# Patient Record
Sex: Female | Born: 1970
Health system: Southern US, Community
[De-identification: ages and names within clinical notes are randomized; demographics above are authoritative.]

## PROBLEM LIST (undated history)

## (undated) DIAGNOSIS — D72829 Elevated white blood cell count, unspecified: Secondary | ICD-10-CM

## (undated) DIAGNOSIS — R7303 Prediabetes: Secondary | ICD-10-CM

## (undated) DIAGNOSIS — D219 Benign neoplasm of connective and other soft tissue, unspecified: Secondary | ICD-10-CM

## (undated) DIAGNOSIS — D649 Anemia, unspecified: Secondary | ICD-10-CM

## (undated) DIAGNOSIS — R7309 Other abnormal glucose: Secondary | ICD-10-CM

## (undated) DIAGNOSIS — N970 Female infertility associated with anovulation: Secondary | ICD-10-CM

## (undated) DIAGNOSIS — F419 Anxiety disorder, unspecified: Secondary | ICD-10-CM

## (undated) DIAGNOSIS — T7840XA Allergy, unspecified, initial encounter: Secondary | ICD-10-CM

## (undated) HISTORY — DX: Elevated white blood cell count, unspecified: D72.829

## (undated) HISTORY — DX: Allergy, unspecified, initial encounter: T78.40XA

## (undated) HISTORY — DX: Benign neoplasm of connective and other soft tissue, unspecified: D21.9

## (undated) HISTORY — DX: Female infertility associated with anovulation: N97.0

## (undated) HISTORY — PX: DILATION AND CURETTAGE OF UTERUS: SHX78

## (undated) HISTORY — PX: FRACTURE SURGERY: SHX138

## (undated) HISTORY — DX: Other abnormal glucose: R73.09

## (undated) HISTORY — PX: EYE SURGERY: SHX253

---

## 1999-08-07 ENCOUNTER — Other Ambulatory Visit: Admission: RE | Admit: 1999-08-07 | Discharge: 1999-08-07 | Payer: Self-pay | Admitting: Obstetrics and Gynecology

## 1999-08-08 ENCOUNTER — Other Ambulatory Visit: Admission: RE | Admit: 1999-08-08 | Discharge: 1999-08-08 | Payer: Self-pay | Admitting: Obstetrics and Gynecology

## 1999-08-08 ENCOUNTER — Encounter (INDEPENDENT_AMBULATORY_CARE_PROVIDER_SITE_OTHER): Payer: Self-pay | Admitting: Specialist

## 2002-04-28 ENCOUNTER — Other Ambulatory Visit: Admission: RE | Admit: 2002-04-28 | Discharge: 2002-04-28 | Payer: Self-pay | Admitting: Obstetrics and Gynecology

## 2002-09-04 ENCOUNTER — Ambulatory Visit (HOSPITAL_COMMUNITY): Admission: RE | Admit: 2002-09-04 | Discharge: 2002-09-04 | Payer: Self-pay | Admitting: Obstetrics and Gynecology

## 2002-09-04 ENCOUNTER — Encounter (INDEPENDENT_AMBULATORY_CARE_PROVIDER_SITE_OTHER): Payer: Self-pay

## 2005-06-23 ENCOUNTER — Emergency Department (HOSPITAL_COMMUNITY): Admission: EM | Admit: 2005-06-23 | Discharge: 2005-06-23 | Payer: Self-pay | Admitting: Family Medicine

## 2005-06-28 ENCOUNTER — Emergency Department (HOSPITAL_COMMUNITY): Admission: EM | Admit: 2005-06-28 | Discharge: 2005-06-28 | Payer: Self-pay | Admitting: Emergency Medicine

## 2006-11-11 ENCOUNTER — Ambulatory Visit: Payer: Self-pay | Admitting: Family Medicine

## 2006-12-18 ENCOUNTER — Ambulatory Visit: Payer: Self-pay | Admitting: Family Medicine

## 2007-04-21 ENCOUNTER — Ambulatory Visit: Payer: Self-pay | Admitting: Family Medicine

## 2007-04-30 ENCOUNTER — Ambulatory Visit: Payer: Self-pay | Admitting: Family Medicine

## 2007-04-30 ENCOUNTER — Other Ambulatory Visit: Admission: RE | Admit: 2007-04-30 | Discharge: 2007-04-30 | Payer: Self-pay | Admitting: Family Medicine

## 2007-10-13 ENCOUNTER — Ambulatory Visit: Payer: Self-pay | Admitting: Family Medicine

## 2007-11-13 ENCOUNTER — Emergency Department (HOSPITAL_COMMUNITY): Admission: EM | Admit: 2007-11-13 | Discharge: 2007-11-13 | Payer: Self-pay | Admitting: Family Medicine

## 2007-11-19 ENCOUNTER — Ambulatory Visit: Payer: Self-pay | Admitting: Family Medicine

## 2008-04-28 ENCOUNTER — Other Ambulatory Visit: Admission: RE | Admit: 2008-04-28 | Discharge: 2008-04-28 | Payer: Self-pay | Admitting: Family Medicine

## 2008-04-28 ENCOUNTER — Encounter: Payer: Self-pay | Admitting: Family Medicine

## 2008-04-28 ENCOUNTER — Ambulatory Visit: Payer: Self-pay | Admitting: Family Medicine

## 2008-11-09 ENCOUNTER — Ambulatory Visit: Payer: Self-pay | Admitting: Family Medicine

## 2009-02-10 ENCOUNTER — Ambulatory Visit: Payer: Self-pay | Admitting: Family Medicine

## 2011-05-17 NOTE — Op Note (Signed)
[name], Christina Mora                           ACCOUNT NO.:  [id number]   MEDICAL RECORD NO.:  [id number]                   PATIENT TYPE:  AMB   LOCATION:  SDC                                  FACILITY:  [facility]   PHYSICIAN:  Sheronette A. Cherly Hensen, M.D.         DATE OF BIRTH:  [birth year]   DATE OF PROCEDURE:  09/04/2002  DATE OF DISCHARGE:                                 OPERATIVE REPORT   PREOPERATIVE DIAGNOSIS:  Dysfunctional uterine bleeding.   POSTOPERATIVE DIAGNOSIS:  1. Dysfunctional uterine bleeding.  2. Possible submucosal fibroid.   PROCEDURE:  Dilation and curettage.   SURGEON:  Sheronette A. Cherly Hensen, M.D.   ANESTHESIA:  MAC, paracervical block.   INDICATIONS:  This is a 40 year old para 1, married black female, last  menstrual period of August 16, 2002, who has been bleeding since that time  until now and who now presents for surgical management.  The patient was  unable to tolerate her endometrial biopsy in the office.  She has been  complaining of passage of large clots and episodic dizziness and inability  to stop her bleeding.  She had a previous ultrasound a couple of months ago  that had shown some fibroids, however, sonohysterogram had only showed a  thickening of the endometrium without any defined submucosal fibroids.  The  patient in the interest of trying to get pregnant was started on Clomid,  however, she has noted that her cycles her remained long.  She is confirmed  anovulatory and had not responded to 100 mg of Clomid at the time that it  was given.  The patient now presents for surgical evaluation and management.  She is deferring attempting to conceive at this time and understood that due  to her bleeding hysteroscopy could not be performed at this time.  The  patient was transferred to the operating room.   DESCRIPTION OF PROCEDURE:  Under adequate monitored anesthesia, the patient  was placed in the dorsal lithotomy position.  She was given  antibiotics  given her prolonged bleeding.  She was sterilely prepped and draped in the  usual fashion.  The bladder was catheterized of a moderate amount of urine.  Bimanual examination revealed an irregular axial 7-8 weeks' size uterus.  No  adnexal masses could be appreciated.  Bivalve speculum was placed in the  vagina.  The cervix was noted to be gaping, about 1 cm dilated, and was taut  at the os.  Nesacaine 1% - 20 cc was injected paracervically at 3 and 9  o'clock.  The anterior lip of the cervix was grasped with a single-tooth  tenaculum.  The uterine cavity was curetted for a large amount of tissue,  and there was an irregularity in the anterior wall suggestive of a possible  submucosal fibroid.  The cavity was curetted until no further tissue of the  endometrium was obtained at which time the procedure was then terminated  by  removing all instruments from the vagina.  Specimen labeled endometrial  curetting was sent to pathology.  Estimated blood loss was minimal.  Complications were none.  The patient tolerated the procedure well and was  transferred to the recovery room in stable condition.                                               Sheronette A. Cherly Hensen, M.D.    SAC/MEDQ  D:  09/04/2002  T:  09/04/2002  Job:  [id number]

## 2011-07-19 ENCOUNTER — Ambulatory Visit
Admission: RE | Admit: 2011-07-19 | Discharge: 2011-07-19 | Disposition: A | Discharge status: Home / Self Care | Payer: BC Managed Care – PPO | Source: Ambulatory Visit | Attending: Physician Assistant | Admitting: Physician Assistant

## 2011-07-19 ENCOUNTER — Other Ambulatory Visit: Payer: Self-pay | Admitting: Physician Assistant

## 2011-07-19 DIAGNOSIS — R634 Abnormal weight loss: Secondary | ICD-10-CM

## 2011-07-19 DIAGNOSIS — D649 Anemia, unspecified: Secondary | ICD-10-CM

## 2011-07-19 MED ORDER — IOHEXOL 300 MG/ML  SOLN
125.0000 mL | Freq: Once | INTRAMUSCULAR | Status: AC | PRN
  Administered 2011-07-19: 125 mL via INTRAVENOUS

## 2011-10-08 LAB — I-STAT 8, (EC8 V) (CONVERTED LAB)
Acid-Base Excess: 2
Chloride: 103
HCT: 36
Operator id: 239701
Potassium: 3.7
Sodium: 137
TCO2: 27
pH, Ven: 7.442 — ABNORMAL HIGH

## 2011-10-08 LAB — DIFFERENTIAL
Basophils Absolute: 0
Basophils Relative: 0
Lymphocytes Relative: 28
Monocytes Relative: 6
Neutro Abs: 7.9 — ABNORMAL HIGH
Neutrophils Relative %: 65

## 2011-10-08 LAB — CBC
MCHC: 33.3
RBC: 4.01
RDW: 13.3

## 2011-10-08 LAB — INFLUENZA A AND B ANTIGEN (CONVERTED LAB)

## 2012-01-01 ENCOUNTER — Ambulatory Visit (INDEPENDENT_AMBULATORY_CARE_PROVIDER_SITE_OTHER): Payer: BC Managed Care – PPO

## 2012-01-01 DIAGNOSIS — R635 Abnormal weight gain: Secondary | ICD-10-CM

## 2012-01-01 DIAGNOSIS — K5900 Constipation, unspecified: Secondary | ICD-10-CM

## 2012-01-01 DIAGNOSIS — R21 Rash and other nonspecific skin eruption: Secondary | ICD-10-CM

## 2012-05-01 ENCOUNTER — Ambulatory Visit (INDEPENDENT_AMBULATORY_CARE_PROVIDER_SITE_OTHER): Payer: BC Managed Care – PPO | Admitting: Family Medicine

## 2012-05-01 VITALS — BP 108/72 | HR 96 | Temp 98.4°F | Resp 16 | Ht 67.0 in | Wt 219.8 lb

## 2012-05-01 DIAGNOSIS — J309 Allergic rhinitis, unspecified: Secondary | ICD-10-CM

## 2012-05-01 DIAGNOSIS — J329 Chronic sinusitis, unspecified: Secondary | ICD-10-CM

## 2012-05-01 MED ORDER — AZITHROMYCIN 250 MG PO TABS: ORAL_TABLET | ORAL | Status: AC

## 2012-05-01 NOTE — Progress Notes (Signed)
[facility]:  Office Visit  Chief Complaint:  Chief Complaint  Patient presents with  . Sinusitis    HPI: Christina Mora is a 41 y.o. female who complains of 3-4 week history of worsening allergies which she has tried Loratidine for. She has a h/o seasonal allergies. Worsening sxs of Sinus pressure, ear pain, maxially and frontal HA, draining yellow-green mucus from . Also has tried Zyrtec without relief. She likes the decongestant but if she takes it too long it dries her out.   Past Medical History  Diagnosis Date  . Allergy   . Anovulatory     10+ years  . Fibroids    Past Surgical History  Procedure Date  . Eye surgery   . Appendectomy    History   Social History  . Marital Status: Married    Spouse Name: N/A    Number of Children: N/A  . Years of Education: N/A   Social History Main Topics  . Smoking status: Never Smoker   . Smokeless tobacco: Not on file  . Alcohol Use: Not on file  . Drug Use: Not on file  . Sexually Active: Not on file   Other Topics Concern  . Not on file   Social History Narrative  . No narrative on file   No family history on file. No Known Allergies Prior to Admission medications   Not on File     ROS: The patient denies fevers, chills, night sweats, unintentional weight loss, chest pain, palpitations, wheezing, dyspnea on exertion, nausea, vomiting, abdominal pain, dysuria, hematuria, melena, numbness, weakness, or tingling. + sinus pressure, HA, teeth pain, yellow rhinorrhea  All other systems have been reviewed and were otherwise negative with the exception of those mentioned in the HPI and as above.    PHYSICAL EXAM: Filed Vitals:   05/01/12 0935  BP: 108/72  Pulse: 96  Temp: 98.4 F (36.9 C)  Resp: 16   Filed Vitals:   05/01/12 0935  Height: 5\' 7"  (1.702 m)  Weight: 219 lb 12.8 oz (99.701 kg)   Body mass index is 34.43 kg/(m^2).  General: Alert, no acute distress, obese HEENT:  Normocephalic,  atraumatic, oropharynx patent. Tm nl, +boggy, erythematous nares, mild sinus pressure behind eyes, no exudates Cardiovascular:  Regular rate and rhythm, no rubs murmurs or gallops.  No Carotid bruits, radial pulse intact. No pedal edema.  Respiratory: Clear to auscultation bilaterally.  No wheezes, rales, or rhonchi.  No cyanosis, no use of accessory musculature GI: No organomegaly, abdomen is soft and non-tender, positive bowel sounds.  No masses. Skin: No rashes. Neurologic: Facial musculature symmetric. Psychiatric: Patient is appropriate throughout our interaction. Lymphatic: No cervical lymphadenopathy Musculoskeletal: Gait intact.   LABS:    EKG/XRAY:   Primary read interpreted by Dr. Conley Rolls at [facility].   ASSESSMENT/PLAN: Encounter Diagnoses  Name Primary?  . Sinusitis Yes  . Allergic rhinitis    Patient questioned why I did not give her a shot of rocephin " like the other urgent care". I told her that I dont think that one shot of rocephin is going to cure her of her sxs and may introduce resistance. I would like to get her better and am not after her copay. She seemed skeptical of that. She insists that she always has to come back for a second round of antibiotics and that if she gets the " shot " she doesn;t have to and feels better. We had a lengthy discussion about  antibiotic management and I went through her records, she had Augmentin the last time. I told her this is a different abx.   1. Z-pack 2. Declined Flonase or atrovent due to HA 3. Sxs management: take Claritin D alternating days and regular claritin alternating days, or lower doses of Zyrtec 5 mg D  One day and then regular zyrtec the next day so she gets the beneits of the antihistamine and decongestant without the dryness; try humidifier, steam baths,etc.   Sherald Balbuena PHUONG, DO 05/01/2012 10:11 AM

## 2012-05-16 ENCOUNTER — Telehealth: Payer: Self-pay

## 2012-05-16 MED ORDER — IPRATROPIUM BROMIDE 0.06 % NA SOLN: 2.0000 | Freq: Three times a day (TID) | NASAL | Status: DC

## 2012-05-16 NOTE — Telephone Encounter (Signed)
PT STATES THAT SHE WAS RECENTLY DIAGNOSED WITH AN SINUS INFECTION AND WAS PRESCRIBED azithromycin (ZITHROMAX) 250 MG tablet.  PT STATES THAT SHE DID GET BETTER FOR A FEW DAYS AND NOW THE SAME SYMPTOMS ARE COMING BACK. PT WOULD LIKE T KNOW IF SOMETHING ELSE COULD BE CALLED INTO [facility] ON [address], PT ALSO MENTIONED THAT IF WE PRESCRIBED HER AN ANTIBIOTIC THEN WE MUST ALSO SEND OVER A RX FOR DIFLUCAN. PHARMACY:[facility] [address]

## 2012-05-16 NOTE — Telephone Encounter (Signed)
We usually do not call in antibiotics. We can call in symptomatic treatment for the congestion and sinus pressure. Sent in nasal spray.

## 2012-05-16 NOTE — Telephone Encounter (Signed)
(  CONTINUED FROM PREVIOUS MESSAGE)  START WITH A STRONGER ABX , MAYBE LIKE AUGMENTIN, BECAUSE SHE STATES AMOX AND ZPAK WON'T WORK FOR HER.

## 2012-05-16 NOTE — Telephone Encounter (Signed)
PT STATES SHE ALREADY HAS A NASAL SPRAY AND SAYS THAT DOESN'T HELP. PT IS AGGRAVATED THAT WE DON'T "LISTEN TO HER WHEN IT COMES TO HER SINUS INFECTIONS". SHE SAYS SHE TOLD DR LE THAT SHE NEEDED A CERTAIN ABX AND DR LE DIDN'T LISTEN TO HER. SHE FEELS SHE WANTS TO LEAVE OUR PRACTICE ALONE. SHE WANTED TO

## 2012-05-16 NOTE — Telephone Encounter (Signed)
Will forward to treating physician 

## 2012-05-16 NOTE — Telephone Encounter (Signed)
PT STATES SHE GOT BETTER FOR A WHILE., BUT IS NOW HAVING GREEN MUCOUS AND SINUS PAIN. PLEASE ADVISE. PT STATES SHE USUALLY GETS ONE SINUS INFECTION A YEAR AND IT REQUIRES A STRONG ABX.

## 2012-05-18 ENCOUNTER — Telehealth: Payer: Self-pay | Admitting: Family Medicine

## 2012-05-18 NOTE — Telephone Encounter (Signed)
Called home phone, unable to leave a message no voicemail.

## 2013-12-27 ENCOUNTER — Encounter (HOSPITAL_COMMUNITY): Payer: Self-pay | Admitting: Emergency Medicine

## 2013-12-27 ENCOUNTER — Emergency Department (HOSPITAL_COMMUNITY)
Admission: EM | Admit: 2013-12-27 | Discharge: 2013-12-27 | Disposition: A | Discharge status: Home / Self Care | Payer: BC Managed Care – PPO | Attending: Emergency Medicine | Admitting: Emergency Medicine

## 2013-12-27 DIAGNOSIS — R05 Cough: Secondary | ICD-10-CM | POA: Insufficient documentation

## 2013-12-27 DIAGNOSIS — B349 Viral infection, unspecified: Secondary | ICD-10-CM

## 2013-12-27 DIAGNOSIS — R000 Tachycardia, unspecified: Secondary | ICD-10-CM | POA: Insufficient documentation

## 2013-12-27 DIAGNOSIS — J029 Acute pharyngitis, unspecified: Secondary | ICD-10-CM | POA: Insufficient documentation

## 2013-12-27 DIAGNOSIS — Z79899 Other long term (current) drug therapy: Secondary | ICD-10-CM | POA: Insufficient documentation

## 2013-12-27 DIAGNOSIS — H9209 Otalgia, unspecified ear: Secondary | ICD-10-CM | POA: Insufficient documentation

## 2013-12-27 DIAGNOSIS — Z8742 Personal history of other diseases of the female genital tract: Secondary | ICD-10-CM | POA: Insufficient documentation

## 2013-12-27 DIAGNOSIS — B9789 Other viral agents as the cause of diseases classified elsewhere: Secondary | ICD-10-CM | POA: Insufficient documentation

## 2013-12-27 DIAGNOSIS — J3489 Other specified disorders of nose and nasal sinuses: Secondary | ICD-10-CM | POA: Insufficient documentation

## 2013-12-27 DIAGNOSIS — J069 Acute upper respiratory infection, unspecified: Secondary | ICD-10-CM | POA: Insufficient documentation

## 2013-12-27 DIAGNOSIS — R059 Cough, unspecified: Secondary | ICD-10-CM | POA: Insufficient documentation

## 2013-12-27 MED ORDER — METOCLOPRAMIDE HCL 5 MG/ML IJ SOLN
10.0000 mg | Freq: Once | INTRAMUSCULAR | Status: AC
  Administered 2013-12-27: 10 mg via INTRAVENOUS
  Filled 2013-12-27: qty 2

## 2013-12-27 MED ORDER — DIPHENHYDRAMINE HCL 50 MG/ML IJ SOLN
25.0000 mg | Freq: Once | INTRAMUSCULAR | Status: AC
  Administered 2013-12-27: 25 mg via INTRAVENOUS
  Filled 2013-12-27: qty 1

## 2013-12-27 MED ORDER — OSELTAMIVIR PHOSPHATE 75 MG PO CAPS: 75.0000 mg | ORAL_CAPSULE | Freq: Two times a day (BID) | ORAL | Status: DC

## 2013-12-27 MED ORDER — OSELTAMIVIR PHOSPHATE 75 MG PO CAPS
75.0000 mg | ORAL_CAPSULE | Freq: Once | ORAL | Status: AC
  Administered 2013-12-27: 75 mg via ORAL
  Filled 2013-12-27: qty 1

## 2013-12-27 MED ORDER — KETOROLAC TROMETHAMINE 30 MG/ML IJ SOLN
30.0000 mg | Freq: Once | INTRAMUSCULAR | Status: AC
  Administered 2013-12-27: 30 mg via INTRAVENOUS
  Filled 2013-12-27: qty 1

## 2013-12-27 MED ORDER — IBUPROFEN 800 MG PO TABS: 800.0000 mg | ORAL_TABLET | Freq: Three times a day (TID) | ORAL | Status: DC

## 2013-12-27 MED ORDER — FENTANYL CITRATE 0.05 MG/ML IJ SOLN
50.0000 ug | Freq: Once | INTRAMUSCULAR | Status: AC
  Administered 2013-12-27: 50 ug via INTRAVENOUS
  Filled 2013-12-27: qty 2

## 2013-12-27 MED ORDER — SODIUM CHLORIDE 0.9 % IV BOLUS (SEPSIS)
1000.0000 mL | Freq: Once | INTRAVENOUS | Status: AC
  Administered 2013-12-27: 1000 mL via INTRAVENOUS

## 2013-12-27 MED ORDER — ACETAMINOPHEN 325 MG PO TABS
650.0000 mg | ORAL_TABLET | Freq: Once | ORAL | Status: AC
  Administered 2013-12-27: 650 mg via ORAL
  Filled 2013-12-27: qty 2

## 2013-12-27 MED ORDER — ANTIPYRINE-BENZOCAINE 5.4-1.4 % OT SOLN
3.0000 [drp] | Freq: Once | OTIC | Status: AC
  Administered 2013-12-27: 3 [drp] via OTIC
  Filled 2013-12-27: qty 10

## 2013-12-27 NOTE — ED Provider Notes (Signed)
CSN: [id number]     Arrival date & time 12/27/13  0221 History   First MD Initiated Contact with Patient 12/27/13 [phone]     Chief Complaint  Patient presents with  . Headache  . URI   (Consider location/radiation/quality/duration/timing/severity/associated sxs/prior Treatment) HPI History provided by patient. Headache with associated fever, cough congestion and sore throat. She also complains of bilateral ear pain without drainage. Onset of symptoms yesterday. No neck pain or neck stiffness. Headache moderate to severe. No nausea vomiting or diarrhea. No difficulty breathing. Some dry cough. Did not get a flu shot this season.  Past Medical History  Diagnosis Date  . Allergy   . Anovulatory     10+ years  . Fibroids    Past Surgical History  Procedure Laterality Date  . Eye surgery    . Appendectomy     History reviewed. No pertinent family history. History  Substance Use Topics  . Smoking status: Never Smoker   . Smokeless tobacco: Not on file  . Alcohol Use: No   OB History   Grav Para Term Preterm Abortions TAB SAB Ect Mult Living                 Review of Systems  Constitutional: Positive for fever and chills.  HENT: Positive for congestion, ear pain and sore throat. Negative for trouble swallowing and voice change.   Eyes: Negative for visual disturbance.  Respiratory: Positive for cough. Negative for shortness of breath.   Cardiovascular: Negative for chest pain.  Gastrointestinal: Negative for vomiting and abdominal pain.  Genitourinary: Negative for dysuria.  Musculoskeletal: Negative for back pain, neck pain and neck stiffness.  Skin: Negative for rash.  Neurological: Positive for headaches. Negative for speech difficulty, weakness and numbness.  All other systems reviewed and are negative.    Allergies  Review of patient's allergies indicates no known allergies.  Home Medications   Current Outpatient Rx  Name  Route  Sig  Dispense  Refill  .  EXPIRED: ipratropium (ATROVENT) 0.06 % nasal spray   Nasal   Place 2 sprays into the nose 3 (three) times daily.   15 mL   0    BP 140/87  Pulse 130  Temp(Src) 102.2 F (39 C) (Oral)  Resp 20  Ht 5\' 7"  (1.702 m)  Wt 210 lb (95.255 kg)  BMI 32.88 kg/m2  SpO2 98%  LMP 12/27/2013 Physical Exam  Constitutional: She is oriented to person, place, and time. She appears well-developed and well-nourished.  HENT:  Head: Normocephalic and atraumatic.  Nasal congestion. TMs clear. No mastoid tenderness. No trismus. Uvula midline. Mildly enlarged tonsils without exudates.  Eyes: EOM are normal. Pupils are equal, round, and reactive to light.  Neck: Normal range of motion. Neck supple. No thyromegaly present.  Cardiovascular: Regular rhythm and intact distal pulses.   Tachycardic  Pulmonary/Chest: Effort normal and breath sounds normal. No stridor. No respiratory distress. She exhibits no tenderness.  Intermittent dry cough  Abdominal: Soft. She exhibits no distension. There is no tenderness. There is no rebound.  Musculoskeletal: Normal range of motion. She exhibits no edema.  Lymphadenopathy:    She has no cervical adenopathy.  Neurological: She is alert and oriented to person, place, and time. She displays normal reflexes. No cranial nerve deficit. Coordination normal.  Speech clear, no pronator drift, equal strengths with grips, biceps triceps. Normal gait.  Skin: Skin is warm and dry.    ED Course  Procedures (including critical care time) Labs  Review Labs Reviewed - No data to display Imaging Review No results found.   Date: 12/27/2013  Rate: 131  Rhythm: sinus tachycardia  QRS Axis: normal  Intervals: normal  ST/T Wave abnormalities: nonspecific ST changes  Conduction Disutrbances:none  Narrative Interpretation:   Old EKG Reviewed: none available  IV fluids. IV headache cocktail. Tylenol provided.  4:46 AM on recheck is feeling significantly better. Heart rate  normalizing.  Plan treatment for influenza.   Plan discharge home with close outpatient followup. Tamiflu prescribed. Motrin prescribed. Return precautions verbalized as understood. MDM  Diagnosis: Viral infection. Presenting with Headache, fever, flulike symptoms  EKG obtained in triage for tachycardia. Improved with IV fluids and medications as above Vital signs nursing notes reviewed and considered    Sunnie Nielsen, MD 12/27/13 [phone]

## 2013-12-27 NOTE — ED Notes (Signed)
Patient presents with c/o headache (unable to sit still in the chair rocking back and forth)  States she thought she had a head cold

## 2014-01-24 ENCOUNTER — Other Ambulatory Visit: Payer: Self-pay | Admitting: Ophthalmology

## 2014-04-22 ENCOUNTER — Ambulatory Visit (INDEPENDENT_AMBULATORY_CARE_PROVIDER_SITE_OTHER): Payer: BC Managed Care – PPO | Admitting: Family Medicine

## 2014-04-22 ENCOUNTER — Encounter: Payer: Self-pay | Admitting: Family Medicine

## 2014-04-22 VITALS — BP 109/71 | HR 74 | Temp 98.1°F | Resp 16 | Ht 66.0 in | Wt 221.0 lb

## 2014-04-22 DIAGNOSIS — Z1231 Encounter for screening mammogram for malignant neoplasm of breast: Secondary | ICD-10-CM

## 2014-04-22 DIAGNOSIS — Z131 Encounter for screening for diabetes mellitus: Secondary | ICD-10-CM

## 2014-04-22 DIAGNOSIS — T148XXA Other injury of unspecified body region, initial encounter: Secondary | ICD-10-CM

## 2014-04-22 LAB — POCT GLYCOSYLATED HEMOGLOBIN (HGB A1C): Hemoglobin A1C: 5.6

## 2014-04-22 MED ORDER — IBUPROFEN 800 MG PO TABS: 800.0000 mg | ORAL_TABLET | Freq: Three times a day (TID) | ORAL | Status: DC | PRN

## 2014-04-22 NOTE — Progress Notes (Signed)
Chief Complaint:  Chief Complaint  Patient presents with  . Arm Pain    left armpit, x 3 weeks  . Hair/Scalp Problem    HPI: Christina Mora is a 43 y.o. female who is here for :  1. She has had a 3 week history left arm pain around her armpit and chest area. It does not radiate.. She has had an aching pain, Varies mild to moderate. Ache is Always there. Feels uncomfortable since she feels like she needs to adjust her shirt. She has no CP, SOB, palpitations. Seh denies any repetitive motion or shoudler or neck pain. 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 However about 3 weeks ago she went dumpster diving at the [facility] daycare sicne they were getting new furniture and she ahs ahd pain sicne, she did not think abotu this before, she ahs not tried anythign for it. She has no family h/o breast cancer but has never had one, no breast changes that she is aware of. She deneis any n/w/tingling.  2. Creepy crawling feeling under scalp in 2 locations, consistent for the last 6 months, she does travel a lot, no one else has it, she has checked herself for lice and has not seen it. It goes away when she scartches it, it is  In the back and right side of her head. She uses a lot of prodycts in her hair , however no new changes.  3. She wants to be screened for diabetes   Past Medical History  Diagnosis Date  . Allergy   . Anovulatory     10+ years  . Fibroids    Past Surgical History  Procedure Laterality Date  . Eye surgery    . Appendectomy     History   Social History  . Marital Status: Married    Spouse Name: N/A    Number of Children: N/A  . Years of Education: N/A   Social History Main Topics  . Smoking status: Never Smoker   . Smokeless tobacco: None  . Alcohol Use: No  . Drug Use: No  . Sexual Activity: None   Other Topics Concern  . None   Social History Narrative  . None   Family History  Problem Relation Age of Onset  .  Stroke Mother   . Heart disease Mother   . COPD Mother   . Epilepsy Mother   . Obesity Mother    Allergies  Allergen Reactions  . Robaxin [Methocarbamol]    Prior to Admission medications   Medication Sig Start Date End Date Taking? Authorizing Provider  ibuprofen (ADVIL,MOTRIN) 800 MG tablet Take 1 tablet (800 mg total) by mouth 3 (three) times daily. 12/27/13  Yes Teressa Lower, MD     ROS: The patient denies fevers, chills, night sweats, unintentional weight loss, chest pain, palpitations, wheezing, dyspnea on exertion, nausea, vomiting, abdominal pain, dysuria, hematuria, melena, numbness, weakness, or tingling.   All other systems have been reviewed and were otherwise negative with the exception of those mentioned in the HPI and as above.    PHYSICAL EXAM: Filed Vitals:   04/22/14 1050  BP: 109/71  Pulse: 74  Temp: 98.1 F (36.7 C)  Resp: 16   Filed Vitals:   04/22/14 1050  Height: 5' 6"  (1.676 m)  Weight: 221 lb (100.245 kg)   Body mass index is 35.69 kg/(m^2).  General: Alert, no acute distress, obese AA female HEENT:  Normocephalic, atraumatic, oropharynx patent. EOMI, PERRLA Cardiovascular:  Regular rate and rhythm, no rubs murmurs or gallops.  No Carotid bruits, radial pulse intact. No pedal edema.  Respiratory: Clear to auscultation bilaterally.  No wheezes, rales, or rhonchi.  No cyanosis, no use of accessory musculature GI: No organomegaly, abdomen is soft and non-tender, positive bowel sounds.  No masses. Skin: No rashes. Neurologic: Facial musculature symmetric. Psychiatric: Patient is appropriate throughout our interaction. Lymphatic: No cervical lymphadenopathy Musculoskeletal: Gait intact. 5/5 strength, 2/2 DTS, sensation intact Neck and shoudler exam nl Hair and scalp exam nl, no eczema or alopecia   LABS: Results for orders placed in visit on 04/22/14  POCT GLYCOSYLATED HEMOGLOBIN (HGB A1C)      Result Value Ref Range   Hemoglobin A1C 5.6        EKG/XRAY:   Primary read interpreted by Dr. Marin Comment at [facility].   ASSESSMENT/PLAN: Encounter Diagnoses  Name Primary?  . Screening for diabetes mellitus Yes  . Visit for screening mammogram   . Sprain and strain    Rx ibupofren 800 mg TID prn for pectoralis msk sprain/strain vs irritation of coopers ligament of breast Refer for mammogram since not UTD Monitor scalp isses, advise to try nonhypo shampoo or cut her hair to see if improved but she says no to the haircutting, she has a lot of hair and it is very thick and curly  F/u prn  Gross sideeffects, risk and benefits, and alternatives of medications d/w patient. Patient is aware that all medications have potential sideeffects and we are unable to predict every sideeffect or drug-drug interaction that may occur.  Glenford Bayley, DO 04/22/2014 3:27 PM

## 2014-04-26 ENCOUNTER — Ambulatory Visit (INDEPENDENT_AMBULATORY_CARE_PROVIDER_SITE_OTHER): Payer: BC Managed Care – PPO | Admitting: Family Medicine

## 2014-04-26 VITALS — BP 124/80 | HR 64 | Temp 98.3°F | Resp 18 | Ht 67.0 in | Wt 221.0 lb

## 2014-04-26 DIAGNOSIS — N9489 Other specified conditions associated with female genital organs and menstrual cycle: Secondary | ICD-10-CM

## 2014-04-26 DIAGNOSIS — R202 Paresthesia of skin: Secondary | ICD-10-CM

## 2014-04-26 DIAGNOSIS — R209 Unspecified disturbances of skin sensation: Secondary | ICD-10-CM

## 2014-04-26 DIAGNOSIS — N949 Unspecified condition associated with female genital organs and menstrual cycle: Secondary | ICD-10-CM

## 2014-04-26 DIAGNOSIS — N9089 Other specified noninflammatory disorders of vulva and perineum: Secondary | ICD-10-CM

## 2014-04-26 DIAGNOSIS — N907 Vulvar cyst: Secondary | ICD-10-CM

## 2014-04-26 LAB — POCT WET PREP WITH KOH
CLUE CELLS WET PREP PER HPF POC: NEGATIVE
KOH Prep POC: NEGATIVE
RBC WET PREP PER HPF POC: NEGATIVE
TRICHOMONAS UA: NEGATIVE
Yeast Wet Prep HPF POC: NEGATIVE

## 2014-04-26 NOTE — Patient Instructions (Addendum)
Use Zostrix or Zostrix H.P. on scalp as directed  Use a little polysporin on the labial lesion  Return if problems

## 2014-04-26 NOTE — Progress Notes (Signed)
Subjective: Patient is here with 2 main complaints. She's been having a paresthesia sensation on the left anterior scalp. She has a sensation that something is been under the skin. It is persisted in the same area. Nothing is visible. Return if look. Dr. Marin Comment looked. She also has had a little sore place on her labia since yesterday primarily. In addition to that she's been for a month or more having a burning sensation like he might have a yeast infection but never has. She is quite anxious about that.  Objective: No lesion visible scalp. External genitalia has a little erythema run its per portion of the labia both sides just below the clitoris. However that is not what she complains of. The place is she is concerned about is a little bump just lateral to the clitoris. This looks like a tiny right. The cyst. I punctured it with a 21-gauge needle and didn't get much contents but did culture it. Vaginal mucosa unremarkable. Cervix benign. Wet prep taken.  Assessment: Scalp paresthesia Labial cyst Vaginal discomfort and burning  Plan: Results for orders placed in visit on 04/26/14  POCT WET PREP WITH KOH      Result Value Ref Range   Trichomonas, UA Negative     Clue Cells Wet Prep HPF POC neg     Epithelial Wet Prep HPF POC 0-6     Yeast Wet Prep HPF POC neg     Bacteria Wet Prep HPF POC trace     RBC Wet Prep HPF POC neg     WBC Wet Prep HPF POC 6-8     KOH Prep POC Negative     Gave reassurance that everything looks good she does not want to take oral antibiotics for that placed on the labia, so have her use a little Polysporin on it. It is not doing better she is to let us know. Use the Zostrix as discussed. As an adult, and she wondered about a place it is been sore in her left axilla for a while the Dr. Marin Comment just recently checked. She asked if these all could be connected and I informed her that I did not think so.

## 2014-04-27 LAB — HSV(HERPES SIMPLEX VRS) I + II AB-IGG: HSV 2 Glycoprotein G Ab, IgG: <0.1 IV

## 2014-04-27 LAB — HIV ANTIBODY (ROUTINE TESTING W REFLEX): HIV: NONREACTIVE

## 2014-04-29 LAB — WOUND CULTURE
GRAM STAIN: NONE SEEN
Gram Stain: NONE SEEN
Gram Stain: NONE SEEN
Organism ID, Bacteria: NORMAL

## 2014-05-03 ENCOUNTER — Encounter: Payer: Self-pay | Admitting: Family Medicine

## 2014-05-04 ENCOUNTER — Ambulatory Visit: Payer: Self-pay | Admitting: Family Medicine

## 2014-05-12 ENCOUNTER — Ambulatory Visit (INDEPENDENT_AMBULATORY_CARE_PROVIDER_SITE_OTHER): Payer: BC Managed Care – PPO | Admitting: Family Medicine

## 2014-05-12 ENCOUNTER — Encounter: Payer: Self-pay | Admitting: Family Medicine

## 2014-05-12 VITALS — BP 108/66 | HR 73 | Temp 98.3°F | Resp 16 | Ht 67.0 in | Wt 218.3 lb

## 2014-05-12 DIAGNOSIS — Z0000 Encounter for general adult medical examination without abnormal findings: Secondary | ICD-10-CM

## 2014-05-12 DIAGNOSIS — E669 Obesity, unspecified: Secondary | ICD-10-CM

## 2014-05-12 DIAGNOSIS — Z01419 Encounter for gynecological examination (general) (routine) without abnormal findings: Secondary | ICD-10-CM

## 2014-05-12 DIAGNOSIS — Z124 Encounter for screening for malignant neoplasm of cervix: Secondary | ICD-10-CM

## 2014-05-12 DIAGNOSIS — R198 Other specified symptoms and signs involving the digestive system and abdomen: Secondary | ICD-10-CM

## 2014-05-12 DIAGNOSIS — Z1231 Encounter for screening mammogram for malignant neoplasm of breast: Secondary | ICD-10-CM

## 2014-05-12 DIAGNOSIS — R3989 Other symptoms and signs involving the genitourinary system: Secondary | ICD-10-CM

## 2014-05-12 DIAGNOSIS — J309 Allergic rhinitis, unspecified: Secondary | ICD-10-CM

## 2014-05-12 LAB — CBC WITH DIFFERENTIAL/PLATELET
BASOS ABS: 0 10*3/uL (ref 0.0–0.1)
Basophils Relative: 0 % (ref 0–1)
Eosinophils Absolute: 0.2 10*3/uL (ref 0.0–0.7)
Eosinophils Relative: 2 % (ref 0–5)
HEMATOCRIT: 36.4 % (ref 36.0–46.0)
Hemoglobin: 12.2 g/dL (ref 12.0–15.0)
LYMPHS PCT: 22 % (ref 12–46)
Lymphs Abs: 2.3 10*3/uL (ref 0.7–4.0)
MCH: 27.7 pg (ref 26.0–34.0)
MCHC: 33.5 g/dL (ref 30.0–36.0)
MCV: 82.5 fL (ref 78.0–100.0)
MONO ABS: 0.5 10*3/uL (ref 0.1–1.0)
Monocytes Relative: 5 % (ref 3–12)
NEUTROS ABS: 7.3 10*3/uL (ref 1.7–7.7)
NEUTROS PCT: 71 % (ref 43–77)
PLATELETS: 425 10*3/uL — AB (ref 150–400)
RBC: 4.41 MIL/uL (ref 3.87–5.11)
RDW: 13.9 % (ref 11.5–15.5)
WBC: 10.3 10*3/uL (ref 4.0–10.5)

## 2014-05-12 MED ORDER — CLOBETASOL PROP EMOLLIENT BASE 0.05 % EX CREA: 1.0000 "application " | TOPICAL_CREAM | Freq: Two times a day (BID) | CUTANEOUS | Status: DC

## 2014-05-12 MED ORDER — DESLORATADINE 5 MG PO TABS: 5.0000 mg | ORAL_TABLET | Freq: Every day | ORAL | Status: DC

## 2014-05-12 NOTE — Progress Notes (Signed)
Subjective:    Patient ID: Christina Mora, female    DOB: 02/27/1971, 43 y.o.   MRN: [id number]  HPI  This 43 y.o. AA female is here for CPE/ PAP. She has seasonal allergies. Pt c/o cyst on perineum; this problem was previously evaluated (STD testing was negative) and medicated topical did not completely resolve lesion.  Patient Active Problem List   Diagnosis Date Noted  . Obesity, unspecified 05/17/2014  . Allergic rhinitis, cause unspecified 05/17/2014   PMHx, Surg Hx, Soc and Fam Hx reviewed.   Review of Systems  Constitutional: Negative.   HENT: Negative.   Eyes: Negative.   Respiratory: Negative.   Cardiovascular: Negative.   Gastrointestinal: Negative.   Endocrine: Negative.   Genitourinary: Positive for genital sores.  Musculoskeletal: Negative.   Skin: Negative.   Allergic/Immunologic: Positive for environmental allergies.  Neurological: Positive for numbness.       Tingling scalp area  Hematological: Negative.   Psychiatric/Behavioral: Positive for sleep disturbance.      Objective:   Physical Exam  Nursing note and vitals reviewed. Constitutional: She is oriented to person, place, and time. Vital signs are normal. She appears well-developed and well-nourished. No distress.  HENT:  Head: Normocephalic and atraumatic.  Right Ear: Hearing, tympanic membrane, external ear and ear canal normal.  Left Ear: Hearing, tympanic membrane, external ear and ear canal normal.  Nose: Mucosal edema present. No nasal deformity or septal deviation.  Mouth/Throat: Uvula is midline and mucous membranes are normal. No oral lesions. Normal dentition. No dental caries. Posterior oropharyngeal erythema present. No oropharyngeal exudate.  Eyes: Conjunctivae, EOM and lids are normal. Pupils are equal, round, and reactive to light. No scleral icterus.  Fundoscopic exam:      The right eye shows no arteriolar narrowing, no AV nicking and no papilledema. The right eye shows red reflex.      The left eye shows no arteriolar narrowing, no AV nicking and no papilledema. The left eye shows red reflex.  Neck: Trachea normal, normal range of motion and full passive range of motion without pain. Neck supple. No spinous process tenderness and no muscular tenderness present. No mass and no thyromegaly present.  Cardiovascular: Normal rate, regular rhythm, S1 normal, S2 normal, normal heart sounds and normal pulses.   No extrasystoles are present. PMI is not displaced.  Exam reveals no gallop and no friction rub.   No murmur heard. Pulmonary/Chest: Effort normal. No respiratory distress. Right breast exhibits no inverted nipple, no mass, no nipple discharge, no skin change and no tenderness. Left breast exhibits no inverted nipple, no mass, no nipple discharge, no skin change and no tenderness. Breasts are symmetrical.  Abdominal: Soft. Normal appearance and bowel sounds are normal. She exhibits no distension and no mass. There is no hepatosplenomegaly. There is no tenderness. There is no guarding and no CVA tenderness. No hernia.  Genitourinary: Rectum normal, vagina normal and uterus normal. There is lesion on the right labia. There is no rash, tenderness or injury on the right labia. There is no rash, tenderness, lesion or injury on the left labia. Cervix exhibits no motion tenderness, no discharge and no friability. Right adnexum displays no mass, no tenderness and no fullness. Left adnexum displays no mass, no tenderness and no fullness.  Tiny cystic lesion anterior labia majora; no ulceration or discharge.  Musculoskeletal: Normal range of motion. She exhibits no edema and no tenderness.       Cervical back: Normal.  Thoracic back: Normal.       Lumbar back: Normal.  Remainder of exam unremarkable.  Lymphadenopathy:       Head (right side): No submental, no submandibular, no tonsillar, no preauricular, no posterior auricular and no occipital adenopathy present.       Head (left  side): No submental, no submandibular, no tonsillar, no preauricular, no posterior auricular and no occipital adenopathy present.    She has no cervical adenopathy.    She has no axillary adenopathy.       Right: No inguinal and no supraclavicular adenopathy present.       Left: No inguinal and no supraclavicular adenopathy present.  Neurological: She is oriented to person, place, and time. She has normal strength and normal reflexes. She displays no atrophy. No cranial nerve deficit or sensory deficit. She exhibits normal muscle tone. Coordination and gait normal.  Skin: Skin is warm, dry and intact. No ecchymosis and no rash noted. She is not diaphoretic. No cyanosis or erythema. Nails show no clubbing.  Psychiatric: She has a normal mood and affect. Her speech is normal and behavior is normal. Judgment and thought content normal. Cognition and memory are normal.      Assessment & Plan:  Routine general medical examination at a health care facility - Plan: CBC with Differential, Lipid panel, COMPLETE METABOLIC PANEL WITH GFR  Encounter for cervical Pap smear with pelvic exam - Plan: Pap IG and Chlamydia/Gonococcus, NAA  Genital sore- Trial Clobetasol topical.  Allergic rhinitis, cause unspecified  Other screening mammogram - Plan: MM Digital Screening  Obesity, unspecified- Encouraged better nutrition and regular fitness activities. Goal weight loss: 1/2-1 pound per week.  Medications prescribed: . Clobetasol Prop Emollient Base 0.05 % emollient cream    Sig: Apply 1 application topically 2 (two) times daily. Use very sparingly.    Dispense:  30 g    Refill:  0  . desloratadine (CLARINEX) 5 MG tablet    Sig: Take 1 tablet (5 mg total) by mouth daily.    Dispense:  30 tablet    Refill:  5

## 2014-05-12 NOTE — Patient Instructions (Addendum)
Keeping You Healthy  Get These Tests 1. Blood Pressure- Have your blood pressure checked once a year by your health care provider.  Normal blood pressure is 120/80. 2. Weight- Have your body mass index (BMI) calculated to screen for obesity.  BMI is measure of body fat based on height and weight.  You can also calculate your own BMI at GravelBags.it. 3. Cholesterol- Have your cholesterol checked every 5 years starting at age 42 then yearly starting at age 17. 47. Chlamydia, HIV, and other sexually transmitted diseases- Get screened every year until age 45, then within three months of each new sexual provider. 5. Pap Smear- Every 1-3 years; discuss with your health care provider. 6. Mammogram- Every year starting at age 68  Take these medicines  Calcium with Vitamin D-Your body needs 1200 mg of Calcium each day and [phone] IU of Vitamin D daily.  Your body can only absorb 500 mg of Calcium at a time so Calcium must be taken in 2 or 3 divided doses throughout the day.  Multivitamin with folic acid- Once daily if it is possible for you to become pregnant.  Get these Immunizations  Gardasil-Series of three doses; prevents HPV related illness such as genital warts and cervical cancer.  Menactra-Single dose; prevents meningitis.  Tetanus shot- Every 10 years.  Flu shot-Every year.  Take these steps 1. Do not smoke-Your healthcare provider can help you quit.  For tips on how to quit go to www.smokefree.gov or call 1-800 QUITNOW. 2. Be physically active- Exercise 5 days a week for at least 30 minutes.  If you are not already physically active, start slow and gradually work up to 30 minutes of moderate physical activity.  Examples of moderate activity include walking briskly, dancing, swimming, bicycling, etc. 3. Breast Cancer- A self breast exam every month is important for early detection of breast cancer.  For more information and instruction on self breast exams, ask your  healthcare provider or https://www.patel.info/. 4. Eat a healthy diet- Eat a variety of healthy foods such as fruits, vegetables, whole grains, low fat milk, low fat cheeses, yogurt, lean meats, poultry and fish, beans, nuts, tofu, etc.  For more information go to www. Thenutritionsource.org 5. Drink alcohol in moderation- Limit alcohol intake to one drink or less per day. Never drink and drive. 6. Depression- Your emotional health is as important as your physical health.  If you're feeling down or losing interest in things you normally enjoy please talk to your healthcare provider about being screened for depression. 7. Dental visit- Brush and floss your teeth twice daily; visit your dentist twice a year. 8. Eye doctor- Get an eye exam at least every 2 years. 9. Helmet use- Always wear a helmet when riding a bicycle, motorcycle, rollerblading or skateboarding. 42. Safe sex- If you may be exposed to sexually transmitted infections, use a condom. 11. Seat belts- Seat belts can save your live; always wear one. 12. Smoke/Carbon Monoxide detectors- These detectors need to be installed on the appropriate level of your home. Replace batteries at least once a year. 13. Skin cancer- When out in the sun please cover up and use sunscreen 15 SPF or higher. 14. Violence- If anyone is threatening or hurting you, please tell your healthcare provider.    Allergic Rhinitis Allergic rhinitis is when the mucous membranes in the nose respond to allergens. Allergens are particles in the air that cause your body to have an allergic reaction. This causes you to release allergic antibodies.  Through a chain of events, these eventually cause you to release histamine into the blood stream. Although meant to protect the body, it is this release of histamine that causes your discomfort, such as frequent sneezing, congestion, and an itchy, runny nose.  CAUSES  Seasonal allergic rhinitis (hay fever) is  caused by pollen allergens that may come from grasses, trees, and weeds. Year-round allergic rhinitis (perennial allergic rhinitis) is caused by allergens such as house dust mites, pet dander, and mold spores.  SYMPTOMS   Nasal stuffiness (congestion).  Itchy, runny nose with sneezing and tearing of the eyes. DIAGNOSIS  Your health care provider can help you determine the allergen or allergens that trigger your symptoms. If you and your health care provider are unable to determine the allergen, skin or blood testing may be used. TREATMENT  Allergic Rhinitis does not have a cure, but it can be controlled by:  Medicines and allergy shots (immunotherapy).  Avoiding the allergen. Hay fever may often be treated with antihistamines in pill or nasal spray forms. Antihistamines block the effects of histamine. There are over-the-counter medicines that may help with nasal congestion and swelling around the eyes. Check with your health care provider before taking or giving this medicine.  If avoiding the allergen or the medicine prescribed do not work, there are many new medicines your health care provider can prescribe. Stronger medicine may be used if initial measures are ineffective. Desensitizing injections can be used if medicine and avoidance does not work. Desensitization is when a patient is given ongoing shots until the body becomes less sensitive to the allergen. Make sure you follow up with your health care provider if problems continue. HOME CARE INSTRUCTIONS It is not possible to completely avoid allergens, but you can reduce your symptoms by taking steps to limit your exposure to them. It helps to know exactly what you are allergic to so that you can avoid your specific triggers. SEEK MEDICAL CARE IF:   You have a fever.  You develop a cough that does not stop easily (persistent).  You have shortness of breath.  You start wheezing.  Symptoms interfere with normal daily  activities. Document Released: 09/10/2001 Document Revised: 10/06/2013 Document Reviewed: 08/23/2013 [facility] Patient Information 2014 [place].

## 2014-05-13 LAB — COMPLETE METABOLIC PANEL WITH GFR
ALT: 12 U/L (ref 0–35)
AST: 16 U/L (ref 0–37)
Albumin: 4.4 g/dL (ref 3.5–5.2)
Alkaline Phosphatase: 91 U/L (ref 39–117)
BILIRUBIN TOTAL: 0.3 mg/dL (ref 0.2–1.2)
BUN: 13 mg/dL (ref 6–23)
CALCIUM: 9.4 mg/dL (ref 8.4–10.5)
CO2: 26 meq/L (ref 19–32)
CREATININE: 0.82 mg/dL (ref 0.50–1.10)
Chloride: 99 mEq/L (ref 96–112)
GFR, EST NON AFRICAN AMERICAN: 89 mL/min
GLUCOSE: 93 mg/dL (ref 70–99)
Potassium: 4.3 mEq/L (ref 3.5–5.3)
Sodium: 134 mEq/L — ABNORMAL LOW (ref 135–145)
Total Protein: 7.2 g/dL (ref 6.0–8.3)

## 2014-05-13 LAB — PAP IG AND CT-NG NAA
CHLAMYDIA PROBE AMP: NEGATIVE
GC PROBE AMP: NEGATIVE

## 2014-05-13 LAB — LIPID PANEL
CHOLESTEROL: 196 mg/dL (ref 0–200)
HDL: 37 mg/dL — AB (ref >39)
LDL Cholesterol: 133 mg/dL — ABNORMAL HIGH (ref 0–99)
TRIGLYCERIDES: 128 mg/dL (ref <150)
Total CHOL/HDL Ratio: 5.3 Ratio
VLDL: 26 mg/dL (ref 0–40)

## 2014-05-14 NOTE — Progress Notes (Signed)
Quick Note:  Please advise pt that there are no major problems with labs and that a copy of results is being mailed.  All your labs look good. Your blood counts are normal.  Lipid panel shows HDL ("good") cholesterol is too low; you can increase this number with regular exercise and including "good fats"- olive and canola oil, almonds and walnuts, cold-water fish- in your diet. All salts (sodium, potassium, calcium) and blood sugar are normal. Kidney and liver functions are normal.  PAP is negative /normal. Test for GC and Chlamydia are negative.  Copy to pt. ______

## 2014-05-17 ENCOUNTER — Encounter: Payer: Self-pay | Admitting: Family Medicine

## 2014-05-17 DIAGNOSIS — J309 Allergic rhinitis, unspecified: Secondary | ICD-10-CM | POA: Insufficient documentation

## 2014-05-17 DIAGNOSIS — E669 Obesity, unspecified: Secondary | ICD-10-CM | POA: Insufficient documentation

## 2014-05-20 ENCOUNTER — Encounter: Payer: Self-pay | Admitting: Radiology

## 2014-05-25 LAB — HM MAMMOGRAPHY

## 2014-06-17 ENCOUNTER — Encounter: Payer: Self-pay | Admitting: Family Medicine

## 2014-10-10 ENCOUNTER — Encounter: Payer: Self-pay | Admitting: Family Medicine

## 2014-10-10 ENCOUNTER — Ambulatory Visit (INDEPENDENT_AMBULATORY_CARE_PROVIDER_SITE_OTHER): Payer: BC Managed Care – PPO | Admitting: Family Medicine

## 2014-10-10 VITALS — BP 106/64 | HR 87 | Temp 98.6°F | Resp 16 | Ht 67.5 in | Wt 218.4 lb

## 2014-10-10 DIAGNOSIS — R109 Unspecified abdominal pain: Secondary | ICD-10-CM

## 2014-10-10 DIAGNOSIS — R3129 Other microscopic hematuria: Secondary | ICD-10-CM

## 2014-10-10 DIAGNOSIS — R312 Other microscopic hematuria: Secondary | ICD-10-CM

## 2014-10-10 LAB — COMPREHENSIVE METABOLIC PANEL
ALBUMIN: 3.9 g/dL (ref 3.5–5.2)
ALK PHOS: 73 U/L (ref 39–117)
ALT: 11 U/L (ref 0–35)
AST: 15 U/L (ref 0–37)
BUN: 13 mg/dL (ref 6–23)
CALCIUM: 9.1 mg/dL (ref 8.4–10.5)
CHLORIDE: 103 meq/L (ref 96–112)
CO2: 26 mEq/L (ref 19–32)
Creat: 0.85 mg/dL (ref 0.50–1.10)
Glucose, Bld: 81 mg/dL (ref 70–99)
POTASSIUM: 4.5 meq/L (ref 3.5–5.3)
SODIUM: 137 meq/L (ref 135–145)
TOTAL PROTEIN: 6.5 g/dL (ref 6.0–8.3)
Total Bilirubin: 0.3 mg/dL (ref 0.2–1.2)

## 2014-10-10 LAB — POCT URINALYSIS DIPSTICK
BILIRUBIN UA: NEGATIVE
Glucose, UA: NEGATIVE
KETONES UA: NEGATIVE
LEUKOCYTES UA: NEGATIVE
NITRITE UA: NEGATIVE
PROTEIN UA: NEGATIVE
Spec Grav, UA: 1.015
Urobilinogen, UA: 0.2
pH, UA: 7

## 2014-10-10 LAB — CBC WITH DIFFERENTIAL/PLATELET
BASOS ABS: 0 10*3/uL (ref 0.0–0.1)
BASOS PCT: 0 % (ref 0–1)
Eosinophils Absolute: 0.3 10*3/uL (ref 0.0–0.7)
Eosinophils Relative: 3 % (ref 0–5)
HEMATOCRIT: 35.8 % — AB (ref 36.0–46.0)
Hemoglobin: 12.3 g/dL (ref 12.0–15.0)
LYMPHS PCT: 26 % (ref 12–46)
Lymphs Abs: 2.5 10*3/uL (ref 0.7–4.0)
MCH: 27.4 pg (ref 26.0–34.0)
MCHC: 34.4 g/dL (ref 30.0–36.0)
MCV: 79.7 fL (ref 78.0–100.0)
MONO ABS: 0.7 10*3/uL (ref 0.1–1.0)
Monocytes Relative: 7 % (ref 3–12)
NEUTROS ABS: 6.3 10*3/uL (ref 1.7–7.7)
NEUTROS PCT: 64 % (ref 43–77)
Platelets: 434 10*3/uL — ABNORMAL HIGH (ref 150–400)
RBC: 4.49 MIL/uL (ref 3.87–5.11)
RDW: 14.1 % (ref 11.5–15.5)
WBC: 9.8 10*3/uL (ref 4.0–10.5)

## 2014-10-10 LAB — POCT UA - MICROSCOPIC ONLY
CRYSTALS, UR, HPF, POC: NEGATIVE
Casts, Ur, LPF, POC: NEGATIVE
Mucus, UA: POSITIVE
WBC, UR, HPF, POC: NEGATIVE
YEAST UA: NEGATIVE

## 2014-10-10 LAB — POCT SEDIMENTATION RATE: POCT SED RATE: 19 mm/hr (ref 0–22)

## 2014-10-10 MED ORDER — CYCLOBENZAPRINE HCL 10 MG PO TABS: 10.0000 mg | ORAL_TABLET | Freq: Every evening | ORAL | Status: DC | PRN

## 2014-10-10 MED ORDER — MELOXICAM 15 MG PO TABS: 15.0000 mg | ORAL_TABLET | Freq: Every day | ORAL | Status: DC | PRN

## 2014-10-10 NOTE — Progress Notes (Signed)
Subjective:    Patient ID: Christina Mora, female    DOB: [birth year], 43 y.o.   MRN: [id number]  HPI Patient presents today with right sided flank pain for 7-10 days. The pain is worse in the evening and with sitting. She has taken ibuprofen 600 mg with no relief. The pain has a building quality and feels heavy. She had 7-8/10 pain last night, but when she awoke this morning it was 1-2/10. The pain is in the lower part of her right flank and it sometimes radiates to her right abdomen.The pain is "hot and heavy." Feels better with standing and stretching. 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 She can not recall any unusual lifting, pushing/pulling. No recent falls. She has mild cerebral palsy and it is not uncommon for her to fall. Her last fall was the beginning of last month when she fell off the top of a 3 step ladder while she was hanging wallpaper. She suffered bruises, but has not had any lasting pain.  Typically sleeps well, didn't sleep well last night due to pain.  She had chickenpox when she was 43 years old. No increased stress currently.   No relation of pain to eating. Had a grilled chicken sandwich last night.   Review of Systems No fever or chills, no diarrhea/constipation, no nausea/vomiting, some decreased energy, no chest pain, no SOB, no rash. No dysuria/hematuria/frequency.     Objective:   Physical Exam  Vitals reviewed. Constitutional: She is oriented to person, place, and time. She appears well-developed and well-nourished.  HENT:  Head: Normocephalic and atraumatic.  Right Ear: Tympanic membrane, external ear and ear canal normal.  Left Ear: Tympanic membrane, external ear and ear canal normal.  Nose: Nose normal.  Mouth/Throat: Uvula is midline, oropharynx is clear and moist and mucous membranes are normal.  Eyes: Conjunctivae and EOM are normal. Pupils are equal, round, and reactive to light. Right eye exhibits  no discharge. Left eye exhibits no discharge.  Neck: Normal range of motion. Neck supple.  Cardiovascular: Normal rate, regular rhythm and normal heart sounds.   Pulmonary/Chest: Effort normal and breath sounds normal.  Abdominal: Soft. Bowel sounds are normal. She exhibits no distension and no mass. There is no tenderness. There is no rebound, no guarding and no CVA tenderness.  Musculoskeletal: Normal range of motion.  Patient with full ROM of neck, spine, hips. Unable to replicate pain with movement or palpation.  Lymphadenopathy:    She has no cervical adenopathy.  Neurological: She is alert and oriented to person, place, and time. She has normal reflexes.  Skin: Skin is warm and dry.  Psychiatric: She has a normal mood and affect. Her behavior is normal. Judgment and thought content normal.   Results for orders placed in visit on 10/10/14  POCT UA - MICROSCOPIC ONLY      Result Value Ref Range   WBC, Ur, HPF, POC neg     RBC, urine, microscopic 6-13     Bacteria, U Microscopic trace     Mucus, UA pos     Epithelial cells, urine per micros 2-3     Crystals, Ur, HPF, POC neg     Casts, Ur, LPF, POC neg     Yeast, UA neg    POCT URINALYSIS DIPSTICK      Result Value Ref Range   Color, UA yellow     Clarity, UA clear  Glucose, UA neg     Bilirubin, UA neg     Ketones, UA neg     Spec Grav, UA 1.015     Blood, UA trace     pH, UA 7.0     Protein, UA neg     Urobilinogen, UA 0.2     Nitrite, UA neg     Leukocytes, UA Negative        Assessment & Plan:  Discussed with Dr. Tamala Julian 1. Flank pain -Not sure of cause of pain, have instructed patient to be on the look out for any rash- it seems unlikely to be shingles given duration of symptoms, but pain follows dermatone pattern.  - POCT UA - Microscopic Only - POCT urinalysis dipstick - CBC with Differential - Comprehensive metabolic panel - POCT SEDIMENTATION RATE - Urine culture - meloxicam (MOBIC) 15 MG tablet; Take 1  tablet (15 mg total) by mouth daily as needed for pain.  Dispense: 30 tablet; Refill: 0 - cyclobenzaprine (FLEXERIL) 10 MG tablet; Take 1 tablet (10 mg total) by mouth at bedtime as needed for muscle spasms. Take 1/2- 1 tablet at bedtime as needed for muscle pain  Dispense: 30 tablet; Refill: 0  2. Hematuria, microscopic - CBC with Differential - Comprehensive metabolic panel - POCT SEDIMENTATION RATE - Urine culture   Elby Beck, FNP-BC  [facility]  10/10/2014 2:50 PM

## 2014-10-11 ENCOUNTER — Ambulatory Visit: Payer: BC Managed Care – PPO | Admitting: Family Medicine

## 2014-10-11 LAB — URINE CULTURE
Colony Count: NO GROWTH
ORGANISM ID, BACTERIA: NO GROWTH

## 2014-10-13 ENCOUNTER — Other Ambulatory Visit: Payer: Self-pay | Admitting: Radiology

## 2014-10-13 DIAGNOSIS — R319 Hematuria, unspecified: Secondary | ICD-10-CM

## 2015-02-17 ENCOUNTER — Ambulatory Visit (INDEPENDENT_AMBULATORY_CARE_PROVIDER_SITE_OTHER): Payer: BC Managed Care – PPO | Admitting: Family Medicine

## 2015-02-17 ENCOUNTER — Ambulatory Visit (INDEPENDENT_AMBULATORY_CARE_PROVIDER_SITE_OTHER): Payer: BC Managed Care – PPO

## 2015-02-17 ENCOUNTER — Encounter: Payer: Self-pay | Admitting: Physician Assistant

## 2015-02-17 VITALS — BP 109/74 | HR 83 | Temp 98.0°F | Resp 16 | Ht 67.0 in | Wt 224.0 lb

## 2015-02-17 DIAGNOSIS — M79622 Pain in left upper arm: Secondary | ICD-10-CM

## 2015-02-17 DIAGNOSIS — M25512 Pain in left shoulder: Secondary | ICD-10-CM

## 2015-02-17 DIAGNOSIS — M791 Myalgia, unspecified site: Secondary | ICD-10-CM

## 2015-02-17 MED ORDER — CYCLOBENZAPRINE HCL 5 MG PO TABS: ORAL_TABLET | ORAL | Status: DC

## 2015-02-17 MED ORDER — MELOXICAM 15 MG PO TABS: 15.0000 mg | ORAL_TABLET | Freq: Every day | ORAL | Status: DC | PRN

## 2015-02-17 NOTE — Patient Instructions (Signed)

## 2015-02-17 NOTE — Progress Notes (Signed)
Subjective:    Patient ID: Christina Mora, female    DOB: September 23, 1971, 44 y.o.   MRN: [id number]  HPI This 44 y.o.female is here for evaluation of persistent pain in L axilla and intermittent CP x 6 months.  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 She denies trauma preceding pain; there is episodic upper back muscle spasm on left side. Pain is not accompanied by numbness or weakness; there is no increase of thoracic pain w/ deep breathing. She denies diaphoresis, fatigue, other CP or tightness, palpitations, SOB or DOE, cough, dizziness, lightheadedness, weakness or syncope.  Pt has had back spasms treated in the past by chiropractor. Last visit >2 years months ago.   Patient Active Problem List   Diagnosis Date Noted  . Obesity, unspecified 05/17/2014  . Allergic rhinitis, cause unspecified 05/17/2014    Prior to Admission medications   Medication Sig Start Date End Date Taking? Authorizing Provider  Ascorbic Acid (VITAMIN C PO) Take by mouth.   Yes Historical Provider, MD  desloratadine (CLARINEX) 5 MG tablet Take 1 tablet (5 mg total) by mouth daily. 05/12/14  Yes Barton Fanny, MD  ibuprofen (ADVIL,MOTRIN) 800 MG tablet Take 1 tablet (800 mg total) by mouth every 8 (eight) hours as needed. 04/22/14  Yes Thao P Le, DO  OVER THE COUNTER MEDICATION    Yes Historical Provider, MD                 Past Surgical History  Procedure Laterality Date  . Appendectomy    . Eye surgery      as a child  . Fracture surgery      History   Social History  . Marital Status: Married    Spouse Name: N/A  . Number of Children: N/A  . Years of Education: N/A   Occupational History  . Not on file.   Social History Main Topics  . Smoking status: Never Smoker   . Smokeless tobacco: Not on file  . Alcohol Use: No  . Drug Use: No  . Sexual Activity: Not on file   Other Topics Concern  . Not on file   Social History Narrative    Family History    Problem Relation Age of Onset  . Stroke Mother   . Heart disease Mother   . COPD Mother   . Epilepsy Mother   . Obesity Mother   . Multiple sclerosis Maternal Grandmother   . Cancer Paternal Grandmother     Review of Systems  As per HPI.     Objective:   Physical Exam  Constitutional: She is oriented to person, place, and time. She appears well-developed and well-nourished. No distress.  Blood pressure 109/74, pulse 83, temperature 98 F (36.7 C), temperature source Oral, resp. rate 16, height 5' 7"  (1.702 m), weight 224 lb (101.606 kg), last menstrual period 02/07/2015, SpO2 99 %.   HENT:  Head: Normocephalic and atraumatic.  Right Ear: External ear normal.  Nose: Nose normal.  Mouth/Throat: Oropharynx is clear and moist.  Eyes: Conjunctivae and EOM are normal. Pupils are equal, round, and reactive to light. No scleral icterus.  Neck: Normal range of motion. Neck supple. No thyromegaly present.  Cardiovascular: Normal rate, regular rhythm and normal heart sounds.  Exam reveals no gallop.   No murmur heard. Pulmonary/Chest: Effort normal and breath sounds normal. No respiratory distress. She exhibits tenderness. She exhibits no mass, no bony tenderness,  no crepitus and no deformity.  Mild tenderness in L upper pectoralis muscle; no mass or bony abnormality.  Musculoskeletal: Normal range of motion.       Right shoulder: Normal.       Left shoulder: Normal.       Cervical back: Normal.       Thoracic back: Normal.  Tenderness under and around L scapula. Tenderness w/ deep palpation in L axilla; no mass or other abnormality.  Lymphadenopathy:    She has no cervical adenopathy.  Neurological: She is alert and oriented to person, place, and time. No cranial nerve deficit. She exhibits normal muscle tone. Coordination normal.  Skin: Skin is warm and dry. She is not diaphoretic.  Psychiatric: She has a normal mood and affect. Her behavior is normal. Judgment and thought content  normal.  Nursing note and vitals reviewed.   [facility] reading (PRIMARY) by  Dr. Leward Quan: CXR- Normal cardiac silhouette and clear lung fields. Thoracic spine w/ early degenerative changes. No active cardiopulmonary disease. No pulmonary lesions or infiltrates.     Assessment & Plan:  Pain in axilla, left - Suspect muscle spasm (may be intercostal muscle or subscapularis muscle). Conservative treatment and referral back to chiropractor. Plan: DG Chest 2 View, Ambulatory referral to Chiropractic  Myalgia - Plan: Ambulatory referral to Chiropractic, cyclobenzaprine (FLEXERIL) 5 MG tablet, meloxicam (MOBIC) 15 MG tablet  Meds ordered this encounter  Medications  . cyclobenzaprine (FLEXERIL) 5 MG tablet    Sig: Take 1 tablet bid or 2 tablets at bedtime as needed for muscle pain.    Dispense:  40 tablet    Refill:  1  . meloxicam (MOBIC) 15 MG tablet    Sig: Take 1 tablet (15 mg total) by mouth daily as needed for pain. Take with food.    Dispense:  30 tablet    Refill:  2

## 2015-02-21 ENCOUNTER — Encounter: Payer: Self-pay | Admitting: Family Medicine

## 2015-06-05 ENCOUNTER — Ambulatory Visit (INDEPENDENT_AMBULATORY_CARE_PROVIDER_SITE_OTHER): Payer: BC Managed Care – PPO | Admitting: Medical

## 2015-06-05 ENCOUNTER — Encounter: Payer: Self-pay | Admitting: Medical

## 2015-06-05 VITALS — BP 100/80 | HR 86 | Temp 99.0°F | Resp 15 | Wt 229.0 lb

## 2015-06-05 DIAGNOSIS — J302 Other seasonal allergic rhinitis: Secondary | ICD-10-CM

## 2015-06-05 DIAGNOSIS — J0100 Acute maxillary sinusitis, unspecified: Secondary | ICD-10-CM | POA: Diagnosis not present

## 2015-06-05 MED ORDER — AZITHROMYCIN 250 MG PO TABS: ORAL_TABLET | ORAL | Status: DC

## 2015-06-05 MED ORDER — DESLORATADINE 5 MG PO TABS: 5.0000 mg | ORAL_TABLET | Freq: Every day | ORAL | Status: DC

## 2015-06-05 NOTE — Progress Notes (Signed)
Subjective Here as a returning old patient.  Last visit years ago.  Was seeing [facility] primarily.   Has hx/o allergies.  She notes that she is running out of her allergy medication, takes it every day religiously.  Lately having bad sinus headaches, lots of sneezing, tired all the time . Feels like she is beat up, fatigued.   Feels its rooted in allergies.   Has itchy throat and itchy ears, lots of sneezing.   Some pain behind eyes.  All of the sudden allergy medication doesn't seem to be working, feeling this way x 1 week.  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  In the past has used generic Claritin, Clarinex.  Has used nasal sprays in the past, but didn't like burning in nose or other side effects.    Maybe has had some low grade fever.  No cough.  No hx/o anemia or thyroid issues.   otherwise been in usual state of health.  No other aggravating or relieving factors. No other complaint.  ROS as in subjective   Objective: BP 100/80 mmHg  Pulse 86  Temp(Src) 99 F (37.2 C) (Oral)  Resp 15  Wt 229 lb (103.874 kg)  General appearance: alert, no distress, WD/WN, mildly ill appearing HEENT: normocephalic, sclerae anicteric, conjunctiva pink and moist, TMs pearly, mild maxilary sinus tenderness, nares with turbinated edema, mild erythema, pharynx normal, tonsils unremarkable Oral cavity: MMM, no lesions Neck: supple, no lymphadenopathy, no thyromegaly, no masses Heart: RRR, normal S1, S2, no murmurs Lungs: CTA bilaterally, no wheezes, rhonchi, or rales Pulses: 2+ symmetric    Assessment: Encounter Diagnoses  Name Primary?  . Acute maxillary sinusitis, recurrence not specified Yes  . Other seasonal allergic rhinitis     Plan: discussed diagnosis, begin Zpak, rest, hydrate well, nasal saline, and if not improving by end of the week, then call back  Allergic rhinitis - c/t clarinex daily

## 2015-07-17 ENCOUNTER — Other Ambulatory Visit: Payer: Self-pay | Admitting: Family Medicine

## 2015-07-17 ENCOUNTER — Other Ambulatory Visit: Payer: Self-pay | Admitting: Medical

## 2015-07-17 ENCOUNTER — Telehealth: Payer: Self-pay | Admitting: Internal Medicine

## 2015-07-17 ENCOUNTER — Telehealth: Payer: Self-pay | Admitting: Family Medicine

## 2015-07-17 MED ORDER — POLYMYXIN B-TRIMETHOPRIM 10000-0.1 UNIT/ML-% OP SOLN: 2.0000 [drp] | Freq: Four times a day (QID) | OPHTHALMIC | Status: DC

## 2015-07-17 NOTE — Telephone Encounter (Signed)
Denice Paradise called to see if we called in rx for his wife's pink eye.  We did but to a local pharmacy.   So I called it to [facility] on [address] in [place], [place]

## 2015-07-17 NOTE — Telephone Encounter (Signed)
Call out Polytrim drops

## 2015-07-17 NOTE — Telephone Encounter (Signed)
Drops was sent to the pharmacy through [facility]

## 2015-07-17 NOTE — Telephone Encounter (Signed)
Pt called and states she is in [place], [place] for her daughter orientation and that she woke up this morning with pink eye in both eyes. Her eye lids are crusty and goopy and itchy and red. 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 She wants to know if you can send in a med to a pharmacy down there. She does not have the name of the pharmacy. She will be down there another 2 days for orientation

## 2015-09-04 ENCOUNTER — Emergency Department (HOSPITAL_COMMUNITY)
Admission: EM | Admit: 2015-09-04 | Discharge: 2015-09-04 | Disposition: A | Discharge status: Home / Self Care | Payer: BC Managed Care – PPO | Source: Home / Self Care

## 2015-09-04 ENCOUNTER — Encounter (HOSPITAL_COMMUNITY): Payer: Self-pay | Admitting: Emergency Medicine

## 2015-09-04 DIAGNOSIS — J0101 Acute recurrent maxillary sinusitis: Secondary | ICD-10-CM | POA: Diagnosis not present

## 2015-09-04 MED ORDER — FLUCONAZOLE 150 MG PO TABS: 150.0000 mg | ORAL_TABLET | Freq: Once | ORAL | Status: DC

## 2015-09-04 MED ORDER — IPRATROPIUM BROMIDE 0.06 % NA SOLN: 2.0000 | Freq: Four times a day (QID) | NASAL | Status: DC

## 2015-09-04 MED ORDER — DOXYCYCLINE HYCLATE 100 MG PO CAPS: 100.0000 mg | ORAL_CAPSULE | Freq: Two times a day (BID) | ORAL | Status: DC

## 2015-09-04 NOTE — ED Provider Notes (Signed)
CSN: [id number]     Arrival date & time 09/04/15  1747 History   None    Chief Complaint  Patient presents with  . URI   (Consider location/radiation/quality/duration/timing/severity/associated sxs/prior Treatment) Patient is a 44 y.o. female presenting with URI. The history is provided by the patient.  URI Presenting symptoms: congestion, cough, facial pain and rhinorrhea   Presenting symptoms: no fever   Severity:  Moderate Onset quality:  Sudden Duration:  3 weeks Progression:  Unchanged Chronicity:  New Relieved by:  None tried Worsened by:  Nothing tried Ineffective treatments:  None tried   Past Medical History  Diagnosis Date  . Allergy   . Anovulatory     10+ years  . Fibroids    Past Surgical History  Procedure Laterality Date  . Appendectomy    . Eye surgery      as a child  . Fracture surgery     Family History  Problem Relation Age of Onset  . Stroke Mother   . Heart disease Mother   . COPD Mother   . Epilepsy Mother   . Obesity Mother   . Multiple sclerosis Maternal Grandmother   . Cancer Paternal Grandmother    Social History  Substance Use Topics  . Smoking status: Never Smoker   . Smokeless tobacco: None  . Alcohol Use: No   OB History    No data available     Review of Systems  Constitutional: Negative.  Negative for fever.  HENT: Positive for congestion, postnasal drip, rhinorrhea and sinus pressure.   Respiratory: Positive for cough.   Cardiovascular: Negative.   All other systems reviewed and are negative.   Allergies  Robaxin  Home Medications   Prior to Admission medications   Medication Sig Start Date End Date Taking? Authorizing Provider  Ascorbic Acid (VITAMIN C PO) Take by mouth.    Historical Provider, MD  azithromycin (ZITHROMAX) 250 MG tablet 2 tablets day 1, then 1 tablet days 2-4 Patient not taking: Reported on 09/04/2015 06/05/15   Camelia Eng Tysinger, PA-C  cyclobenzaprine (FLEXERIL) 5 MG tablet Take 1 tablet bid or 2  tablets at bedtime as needed for muscle pain. Patient not taking: Reported on 06/05/2015 02/17/15   Barton Fanny, MD  desloratadine (CLARINEX) 5 MG tablet Take 1 tablet (5 mg total) by mouth daily. 06/05/15   Camelia Eng Tysinger, PA-C  doxycycline (VIBRAMYCIN) 100 MG capsule Take 1 capsule (100 mg total) by mouth 2 (two) times daily. 09/04/15   Billy Fischer, MD  fluconazole (DIFLUCAN) 150 MG tablet Take 1 tablet (150 mg total) by mouth once. May repeat in 1 week if needed. 09/04/15   Billy Fischer, MD  ipratropium (ATROVENT) 0.06 % nasal spray Place 2 sprays into both nostrils 4 (four) times daily. 09/04/15   Billy Fischer, MD  meloxicam (MOBIC) 15 MG tablet Take 1 tablet (15 mg total) by mouth daily as needed for pain. Take with food. Patient not taking: Reported on 06/05/2015 02/17/15   Barton Fanny, MD  OVER THE COUNTER MEDICATION     Historical Provider, MD  trimethoprim-polymyxin b (POLYTRIM) ophthalmic solution Place 2 drops into both eyes every 6 (six) hours. Patient not taking: Reported on 09/04/2015 07/17/15   Camelia Eng Tysinger, PA-C   Meds Ordered and Administered this Visit  Medications - No data to display  BP 135/79 mmHg  Pulse 82  Temp(Src) 99.1 F (37.3 C) (Oral)  Resp 16  SpO2 98%  LMP 08/21/2015 No data found.   Physical Exam  Constitutional: She is oriented to person, place, and time. She appears well-developed and well-nourished. No distress.  HENT:  Right Ear: External ear normal.  Left Ear: External ear normal.  Nose: Mucosal edema and rhinorrhea present.  Mouth/Throat: No oropharyngeal exudate.  Eyes: Conjunctivae are normal. Pupils are equal, round, and reactive to light.  Neck: Normal range of motion. Neck supple.  Pulmonary/Chest: Effort normal and breath sounds normal.  Lymphadenopathy:    She has no cervical adenopathy.  Neurological: She is alert and oriented to person, place, and time.  Skin: Skin is warm and dry.  Nursing note and vitals  reviewed.   ED Course  Procedures (including critical care time)  Labs Review Labs Reviewed - No data to display  Imaging Review No results found.   Visual Acuity Review  Right Eye Distance:   Left Eye Distance:   Bilateral Distance:    Right Eye Near:   Left Eye Near:    Bilateral Near:         MDM   1. Acute recurrent maxillary sinusitis        Billy Fischer, MD 09/04/15 1919

## 2015-09-04 NOTE — Discharge Instructions (Signed)
Drink plenty of fluids as discussed, use medicine as prescribed, and mucinex or delsym for cough. Return or see your doctor if further problems

## 2015-09-04 NOTE — ED Notes (Signed)
C/O feeling tired, pain behind eyes, upper teeth hurt, headache.

## 2015-12-18 LAB — HM MAMMOGRAPHY

## 2015-12-31 DIAGNOSIS — D72829 Elevated white blood cell count, unspecified: Secondary | ICD-10-CM

## 2015-12-31 HISTORY — DX: Elevated white blood cell count, unspecified: D72.829

## 2016-01-17 ENCOUNTER — Other Ambulatory Visit: Payer: Self-pay | Admitting: Radiology

## 2016-01-17 LAB — HM MAMMOGRAPHY

## 2016-01-26 ENCOUNTER — Ambulatory Visit (INDEPENDENT_AMBULATORY_CARE_PROVIDER_SITE_OTHER): Payer: BC Managed Care – PPO | Admitting: Family Medicine

## 2016-01-26 ENCOUNTER — Encounter: Payer: Self-pay | Admitting: Family Medicine

## 2016-01-26 VITALS — BP 130/75 | HR 98 | Temp 99.2°F | Resp 16 | Ht 67.5 in | Wt 225.0 lb

## 2016-01-26 DIAGNOSIS — R102 Pelvic and perineal pain unspecified side: Secondary | ICD-10-CM

## 2016-01-26 DIAGNOSIS — Z113 Encounter for screening for infections with a predominantly sexual mode of transmission: Secondary | ICD-10-CM | POA: Diagnosis not present

## 2016-01-26 DIAGNOSIS — N898 Other specified noninflammatory disorders of vagina: Secondary | ICD-10-CM | POA: Diagnosis not present

## 2016-01-26 DIAGNOSIS — Z1329 Encounter for screening for other suspected endocrine disorder: Secondary | ICD-10-CM | POA: Diagnosis not present

## 2016-01-26 DIAGNOSIS — Z0000 Encounter for general adult medical examination without abnormal findings: Secondary | ICD-10-CM

## 2016-01-26 DIAGNOSIS — Z131 Encounter for screening for diabetes mellitus: Secondary | ICD-10-CM | POA: Diagnosis not present

## 2016-01-26 DIAGNOSIS — Z124 Encounter for screening for malignant neoplasm of cervix: Secondary | ICD-10-CM | POA: Diagnosis not present

## 2016-01-26 DIAGNOSIS — F430 Acute stress reaction: Secondary | ICD-10-CM

## 2016-01-26 DIAGNOSIS — E7800 Pure hypercholesterolemia, unspecified: Secondary | ICD-10-CM | POA: Diagnosis not present

## 2016-01-26 DIAGNOSIS — Z23 Encounter for immunization: Secondary | ICD-10-CM

## 2016-01-26 LAB — COMPREHENSIVE METABOLIC PANEL
ALK PHOS: 78 U/L (ref 33–115)
ALT: 13 U/L (ref 6–29)
AST: 15 U/L (ref 10–30)
Albumin: 4.3 g/dL (ref 3.6–5.1)
BUN: 11 mg/dL (ref 7–25)
CO2: 28 mmol/L (ref 20–31)
CREATININE: 0.97 mg/dL (ref 0.50–1.10)
Calcium: 9.5 mg/dL (ref 8.6–10.2)
Chloride: 101 mmol/L (ref 98–110)
GLUCOSE: 85 mg/dL (ref 65–99)
POTASSIUM: 4.1 mmol/L (ref 3.5–5.3)
SODIUM: 139 mmol/L (ref 135–146)
TOTAL PROTEIN: 7.3 g/dL (ref 6.1–8.1)
Total Bilirubin: 0.4 mg/dL (ref 0.2–1.2)

## 2016-01-26 LAB — POCT URINALYSIS DIP (MANUAL ENTRY)
BILIRUBIN UA: NEGATIVE
Glucose, UA: NEGATIVE
Ketones, POC UA: NEGATIVE
LEUKOCYTES UA: NEGATIVE
NITRITE UA: NEGATIVE
PH UA: 7
PROTEIN UA: NEGATIVE
Spec Grav, UA: 1.02
UROBILINOGEN UA: 0.2

## 2016-01-26 LAB — CBC WITH DIFFERENTIAL/PLATELET
BASOS ABS: 0 10*3/uL (ref 0.0–0.1)
BASOS PCT: 0 % (ref 0–1)
EOS ABS: 0 10*3/uL (ref 0.0–0.7)
EOS PCT: 0 % (ref 0–5)
HCT: 36.6 % (ref 36.0–46.0)
Hemoglobin: 11.9 g/dL — ABNORMAL LOW (ref 12.0–15.0)
LYMPHS ABS: 2.6 10*3/uL (ref 0.7–4.0)
Lymphocytes Relative: 20 % (ref 12–46)
MCH: 26.6 pg (ref 26.0–34.0)
MCHC: 32.5 g/dL (ref 30.0–36.0)
MCV: 81.9 fL (ref 78.0–100.0)
MPV: 9.3 fL (ref 8.6–12.4)
Monocytes Absolute: 0.7 10*3/uL (ref 0.1–1.0)
Monocytes Relative: 5 % (ref 3–12)
NEUTROS PCT: 75 % (ref 43–77)
Neutro Abs: 9.9 10*3/uL — ABNORMAL HIGH (ref 1.7–7.7)
PLATELETS: 420 10*3/uL — AB (ref 150–400)
RBC: 4.47 MIL/uL (ref 3.87–5.11)
RDW: 14.7 % (ref 11.5–15.5)
WBC: 13.2 10*3/uL — AB (ref 4.0–10.5)

## 2016-01-26 LAB — POCT WET + KOH PREP
Trich by wet prep: ABSENT
YEAST BY WET PREP: ABSENT
Yeast by KOH: ABSENT

## 2016-01-26 LAB — TSH: TSH: 1.654 u[IU]/mL (ref 0.350–4.500)

## 2016-01-26 LAB — LIPID PANEL
Cholesterol: 200 mg/dL (ref 125–200)
HDL: 36 mg/dL — AB (ref >=46)
LDL CALC: 125 mg/dL (ref <130)
TRIGLYCERIDES: 193 mg/dL — AB (ref <150)
Total CHOL/HDL Ratio: 5.6 Ratio — ABNORMAL HIGH (ref <=5.0)
VLDL: 39 mg/dL — AB (ref <30)

## 2016-01-26 LAB — POC MICROSCOPIC URINALYSIS (UMFC): MUCUS RE: ABSENT

## 2016-01-26 MED ORDER — TERCONAZOLE 0.4 % VA CREA: 1.0000 | TOPICAL_CREAM | Freq: Every day | VAGINAL | Status: DC

## 2016-01-26 MED ORDER — DESLORATADINE 5 MG PO TABS: 5.0000 mg | ORAL_TABLET | Freq: Every day | ORAL | Status: DC

## 2016-01-26 MED ORDER — METRONIDAZOLE 500 MG PO TABS: 500.0000 mg | ORAL_TABLET | Freq: Three times a day (TID) | ORAL | Status: DC

## 2016-01-26 NOTE — Progress Notes (Signed)
Subjective:    Patient ID: Christina Mora, female    DOB: 1971/12/05, 45 y.o.   MRN: [id number]  01/26/2016  Annual Exam   HPI This 45 y.o. female presents for Routine Physical Examination.  Last physical:  05-12-2014 Pap smear:  05-12-2014 WNL; regular menses (5 days, no cramping, moderate flow) Mammogram:  [facility] s/p biopsy; cyst; benign.   Colonoscopy: never TDAP:  2012 Influenza:  today Eye exam:  [facility]; every 2 years Dental exam:  Every 2 years.  Allergic Rhinitis: taking Loratadine 58m daily PRN.  Fall and Spring.  Pelvic pain/vaginal irritation: onset 6-8 weeks ago; tried OTC medications a while ago; initially concerned about yeast infection.  +burning; took Diflucan and thought it got better.  Pelvic pain tight and twisting.  No vaginal itching; no vaginal discharge.  Small amount of discharge.  No sexual activity with husband for years.    Stress/anxiety:  Having an affair with female; married to female.  Abnormal mammogram.  Mother with CHF/CAD; desires therapy; not prone to anxiety or depression; does not desire medication; usually very calm and strong will; good family and friend support.  Has discussed with husband.    Review of Systems  Constitutional: Negative for fever, chills, diaphoresis, activity change, appetite change, fatigue and unexpected weight change.  HENT: Negative for congestion, dental problem, drooling, ear discharge, ear pain, facial swelling, hearing loss, mouth sores, nosebleeds, postnasal drip, rhinorrhea, sinus pressure, sneezing, sore throat, tinnitus, trouble swallowing and voice change.   Eyes: Negative for photophobia, pain, discharge, redness, itching and visual disturbance.  Respiratory: Negative for apnea, cough, choking, chest tightness, shortness of breath, wheezing and stridor.   Cardiovascular: Negative for chest pain, palpitations and leg swelling.  Gastrointestinal: Negative for nausea, vomiting, abdominal pain, diarrhea, constipation,  blood in stool, abdominal distention, anal bleeding and rectal pain.  Endocrine: Negative for cold intolerance, heat intolerance, polydipsia, polyphagia and polyuria.  Genitourinary: Negative for dysuria, urgency, frequency, hematuria, flank pain, decreased urine volume, vaginal bleeding, vaginal discharge, enuresis, difficulty urinating, genital sores, vaginal pain, menstrual problem, pelvic pain and dyspareunia.  Musculoskeletal: Negative for myalgias, back pain, joint swelling, arthralgias, gait problem, neck pain and neck stiffness.  Skin: Negative for color change, pallor, rash and wound.  Allergic/Immunologic: Negative for environmental allergies, food allergies and immunocompromised state.  Neurological: Negative for dizziness, tremors, seizures, syncope, facial asymmetry, speech difficulty, weakness, light-headedness, numbness and headaches.  Hematological: Negative for adenopathy. Does not bruise/bleed easily.  Psychiatric/Behavioral: Negative for suicidal ideas, hallucinations, behavioral problems, confusion, sleep disturbance, self-injury, dysphoric mood, decreased concentration and agitation. The patient is not nervous/anxious and is not hyperactive.     Past Medical History  Diagnosis Date  . Anovulatory     10+ years  . Fibroids   . Allergy     Loratadine PRN   Past Surgical History  Procedure Laterality Date  . Eye surgery      as a child  . Fracture surgery      R femur fracture age 45   Allergies  Allergen Reactions  . Robaxin [Methocarbamol]    Current Outpatient Prescriptions  Medication Sig Dispense Refill  . Ascorbic Acid (VITAMIN C PO) Take by mouth.    . desloratadine (CLARINEX) 5 MG tablet Take 1 tablet (5 mg total) by mouth daily. 90 tablet 3  . OVER THE COUNTER MEDICATION      No current facility-administered medications for this visit.   Social History   Social History  . Marital Status: Married  Spouse Name: N/A  . Number of Children: N/A  .  Years of Education: N/A   Occupational History  . BUSINESS OWNER     daycare centers x 2   Social History Main Topics  . Smoking status: Never Smoker   . Smokeless tobacco: Not on file  . Alcohol Use: No  . Drug Use: No  . Sexual Activity: Yes   Other Topics Concern  . 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   Family History  Problem Relation Age of Onset  . Stroke Mother   . Heart disease Mother     AMI x few with defibrillator/CHF  . COPD Mother   . Epilepsy Mother   . Obesity Mother   . Multiple sclerosis Maternal Grandmother   . Cancer Paternal Grandmother        Objective:    BP 130/75 mmHg  Pulse 98  Temp(Src) 99.2 F (37.3 C) (Oral)  Resp 16  Ht 5' 7.5" (1.715 m)  Wt 225 lb (102.059 kg)  BMI 34.70 kg/m2  SpO2 100%  LMP 01/15/2016 Physical Exam  Constitutional: She is oriented to person, place, and time. She appears well-developed and well-nourished. No distress.  HENT:  Head: Normocephalic and atraumatic.  Right Ear: External ear normal.  Left Ear: External ear normal.  Nose: Nose normal.  Mouth/Throat: Oropharynx is clear and moist.  Eyes: Conjunctivae and EOM are normal. Pupils are equal, round, and reactive to light.  Neck: Normal range of motion and full passive range of motion without pain. Neck supple. No JVD present. Carotid bruit is not present. No thyromegaly present.  Cardiovascular: Normal rate, regular rhythm and normal heart sounds.  Exam reveals no gallop and no friction rub.   No murmur heard. Pulmonary/Chest:  Effort normal and breath sounds normal. She has no wheezes. She has no rales.  Abdominal: Soft. Bowel sounds are normal. She exhibits no distension and no mass. There is no tenderness. There is no rebound and no guarding.  Musculoskeletal:       Right shoulder: Normal.       Left shoulder: Normal.       Cervical back: Normal.  Lymphadenopathy:    She has no cervical adenopathy.  Neurological: She is alert and oriented to person, place, and time. She has normal reflexes. No cranial nerve deficit. She exhibits normal muscle tone. Coordination normal.  Skin: Skin is warm and dry. No rash noted. She is not diaphoretic. No erythema. No pallor.  Psychiatric: She has a normal mood and affect. Her behavior is normal. Judgment and thought content normal.  Nursing note and vitals reviewed.  Results for orders placed or performed in visit on 01/26/16  POCT urinalysis dipstick  Result Value Ref Range   Color, UA yellow yellow   Clarity, UA clear clear   Glucose, UA negative  negative   Bilirubin, UA negative negative   Ketones, POC UA negative negative   Spec Grav, UA 1.020    Blood, UA small (A) negative   pH, UA 7.0    Protein Ur, POC negative negative   Urobilinogen, UA 0.2    Nitrite, UA Negative Negative   Leukocytes, UA Negative Negative       Assessment & Plan:   1. Routine physical examination   2. Pure hypercholesterolemia   3. Screening for diabetes mellitus   4. Screening for thyroid disorder   5. Need for prophylactic vaccination and inoculation against influenza   6. Pelvic pain in female   7. Vaginal irritation   8. Cervical cancer screening   9. Stress reaction   10. Screening for STD (sexually transmitted disease)     Orders Placed This Encounter  Procedures  . Flu Vaccine QUAD 36+ mos IM  . CBC with Differential/Platelet  . Comprehensive metabolic panel    Order Specific Question:  Has the patient fasted?    Answer:  Yes  . Hemoglobin A1c  . Lipid panel     Order Specific Question:  Has the patient fasted?    Answer:  Yes  . TSH  . HIV antibody  . RPR  . Ambulatory referral to Psychology    Referral Priority:  Routine    Referral Type:  Psychiatric    Referral Reason:  Specialty Services Required    Requested Specialty:  Psychology    Number of Visits Requested:  1  . POCT urinalysis dipstick  . POCT Wet + KOH Prep   Meds ordered this encounter  Medications  . desloratadine (CLARINEX) 5 MG tablet    Sig: Take 1 tablet (5 mg total) by mouth daily.    Dispense:  90 tablet    Refill:  3    No Follow-up on file.    Hadassah Rana Elayne Guerin, M.D. [facility] [address] [place], [place]  [address] [phone] phone [phone] fax

## 2016-01-26 NOTE — Patient Instructions (Signed)
Keeping You Healthy  Get These Tests 1. Blood Pressure- Have your blood pressure checked once a year by your health care provider.  Normal blood pressure is 120/80. 2. Weight- Have your body mass index (BMI) calculated to screen for obesity.  BMI is measure of body fat based on height and weight.  You can also calculate your own BMI at GravelBags.it. 3. Cholesterol- Have your cholesterol checked every 5 years starting at age 44 then yearly starting at age 14. 22. Chlamydia, HIV, and other sexually transmitted diseases- Get screened every year until age 66, then within three months of each new sexual provider. 5. Pap Test - Every 1-5 years; discuss with your health care provider. 6. Mammogram- Every 1-2 years starting at age 22--50  Take these medicines  Calcium with Vitamin D-Your body needs 1200 mg of Calcium each day and 5623841230 IU of Vitamin D daily.  Your body can only absorb 500 mg of Calcium at a time so Calcium must be taken in 2 or 3 divided doses throughout the day.  Multivitamin with folic acid- Once daily if it is possible for you to become pregnant.  Get these Immunizations  Gardasil-Series of three doses; prevents HPV related illness such as genital warts and cervical cancer.  Menactra-Single dose; prevents meningitis.  Tetanus shot- Every 10 years.  Flu shot-Every year.  Take these steps 1. Do not smoke-Your healthcare provider can help you quit.  For tips on how to quit go to www.smokefree.gov or call 1-800 QUITNOW. 2. Be physically active- Exercise 5 days a week for at least 30 minutes.  If you are not already physically active, start slow and gradually work up to 30 minutes of moderate physical activity.  Examples of moderate activity include walking briskly, dancing, swimming, bicycling, etc. 3. Breast Cancer- A self breast exam every month is important for early detection of breast cancer.  For more information and instruction on self breast exams, ask your  healthcare provider or https://www.patel.info/. 4. Eat a healthy diet- Eat a variety of healthy foods such as fruits, vegetables, whole grains, low fat milk, low fat cheeses, yogurt, lean meats, poultry and fish, beans, nuts, tofu, etc.  For more information go to www. Thenutritionsource.org 5. Drink alcohol in moderation- Limit alcohol intake to one drink or less per day. Never drink and drive. 6. Depression- Your emotional health is as important as your physical health.  If you're feeling down or losing interest in things you normally enjoy please talk to your healthcare provider about being screened for depression. 7. Dental visit- Brush and floss your teeth twice daily; visit your dentist twice a year. 8. Eye doctor- Get an eye exam at least every 2 years. 9. Helmet use- Always wear a helmet when riding a bicycle, motorcycle, rollerblading or skateboarding. 70. Safe sex- If you may be exposed to sexually transmitted infections, use a condom. 11. Seat belts- Seat belts can save your live; always wear one. 12. Smoke/Carbon Monoxide detectors- These detectors need to be installed on the appropriate level of your home. Replace batteries at least once a year. 13. Skin cancer- When out in the sun please cover up and use sunscreen 15 SPF or higher. 14. Violence- If anyone is threatening or hurting you, please tell your healthcare provider.

## 2016-01-27 LAB — RPR

## 2016-01-27 LAB — HEMOGLOBIN A1C
HEMOGLOBIN A1C: 5.8 % — AB (ref <5.7)
Mean Plasma Glucose: 120 mg/dL — ABNORMAL HIGH (ref <117)

## 2016-01-27 LAB — HIV ANTIBODY (ROUTINE TESTING W REFLEX): HIV: NONREACTIVE

## 2016-01-30 LAB — PAP IG, CT-NG NAA, HPV HIGH-RISK
CHLAMYDIA PROBE AMP: NOT DETECTED
GC Probe Amp: NOT DETECTED
HPV DNA High Risk: NOT DETECTED

## 2016-02-01 ENCOUNTER — Ambulatory Visit (INDEPENDENT_AMBULATORY_CARE_PROVIDER_SITE_OTHER): Payer: BC Managed Care – PPO | Admitting: Family Medicine

## 2016-02-01 VITALS — BP 120/74 | HR 101 | Temp 99.1°F | Resp 18 | Ht 67.75 in | Wt 222.8 lb

## 2016-02-01 DIAGNOSIS — N72 Inflammatory disease of cervix uteri: Secondary | ICD-10-CM

## 2016-02-01 DIAGNOSIS — R102 Pelvic and perineal pain: Secondary | ICD-10-CM

## 2016-02-01 DIAGNOSIS — R350 Frequency of micturition: Secondary | ICD-10-CM | POA: Diagnosis not present

## 2016-02-01 DIAGNOSIS — F411 Generalized anxiety disorder: Secondary | ICD-10-CM | POA: Diagnosis not present

## 2016-02-01 LAB — POC MICROSCOPIC URINALYSIS (UMFC): MUCUS RE: ABSENT

## 2016-02-01 LAB — POCT URINALYSIS DIP (MANUAL ENTRY)
BILIRUBIN UA: NEGATIVE
GLUCOSE UA: NEGATIVE
NITRITE UA: NEGATIVE
Protein Ur, POC: 30 — AB
Spec Grav, UA: 1.015
UROBILINOGEN UA: 0.2
pH, UA: 7.5

## 2016-02-01 MED ORDER — FLUCONAZOLE 150 MG PO TABS: 150.0000 mg | ORAL_TABLET | Freq: Once | ORAL | Status: DC

## 2016-02-01 MED ORDER — LORAZEPAM 0.5 MG PO TABS: 0.5000 mg | ORAL_TABLET | Freq: Two times a day (BID) | ORAL | Status: DC | PRN

## 2016-02-01 MED ORDER — CIPROFLOXACIN HCL 500 MG PO TABS: 500.0000 mg | ORAL_TABLET | Freq: Two times a day (BID) | ORAL | Status: DC
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
## 2016-02-03 LAB — URINE CULTURE: Colony Count: 70000

## 2016-02-05 ENCOUNTER — Encounter: Payer: Self-pay | Admitting: Family Medicine

## 2016-02-14 ENCOUNTER — Encounter: Payer: Self-pay | Admitting: Family Medicine

## 2016-02-22 ENCOUNTER — Ambulatory Visit (HOSPITAL_COMMUNITY): Payer: BC Managed Care – PPO | Admitting: Psychiatry

## 2016-03-07 ENCOUNTER — Ambulatory Visit (INDEPENDENT_AMBULATORY_CARE_PROVIDER_SITE_OTHER): Payer: BC Managed Care – PPO | Admitting: Obstetrics and Gynecology

## 2016-03-07 ENCOUNTER — Encounter: Payer: Self-pay | Admitting: Obstetrics and Gynecology

## 2016-03-07 ENCOUNTER — Ambulatory Visit (INDEPENDENT_AMBULATORY_CARE_PROVIDER_SITE_OTHER): Payer: BC Managed Care – PPO

## 2016-03-07 ENCOUNTER — Other Ambulatory Visit: Payer: Self-pay | Admitting: Obstetrics and Gynecology

## 2016-03-07 VITALS — BP 110/70 | HR 80 | Resp 14 | Ht 67.5 in | Wt 217.0 lb

## 2016-03-07 VITALS — BP 110/70 | HR 80 | Resp 14 | Wt 217.0 lb

## 2016-03-07 DIAGNOSIS — N946 Dysmenorrhea, unspecified: Secondary | ICD-10-CM

## 2016-03-07 DIAGNOSIS — D259 Leiomyoma of uterus, unspecified: Secondary | ICD-10-CM

## 2016-03-07 DIAGNOSIS — R102 Pelvic and perineal pain unspecified side: Secondary | ICD-10-CM

## 2016-03-07 LAB — CBC WITH DIFFERENTIAL/PLATELET
BASOS PCT: 0 % (ref 0–1)
Basophils Absolute: 0 10*3/uL (ref 0.0–0.1)
EOS PCT: 1 % (ref 0–5)
Eosinophils Absolute: 0.1 10*3/uL (ref 0.0–0.7)
HEMATOCRIT: 34.8 % — AB (ref 36.0–46.0)
HEMOGLOBIN: 11.2 g/dL — AB (ref 12.0–15.0)
LYMPHS PCT: 19 % (ref 12–46)
Lymphs Abs: 2.6 10*3/uL (ref 0.7–4.0)
MCH: 26.6 pg (ref 26.0–34.0)
MCHC: 32.2 g/dL (ref 30.0–36.0)
MCV: 82.7 fL (ref 78.0–100.0)
MONO ABS: 0.8 10*3/uL (ref 0.1–1.0)
MONOS PCT: 6 % (ref 3–12)
MPV: 9.4 fL (ref 8.6–12.4)
NEUTROS ABS: 10.1 10*3/uL — AB (ref 1.7–7.7)
Neutrophils Relative %: 74 % (ref 43–77)
Platelets: 451 10*3/uL — ABNORMAL HIGH (ref 150–400)
RBC: 4.21 MIL/uL (ref 3.87–5.11)
RDW: 14.7 % (ref 11.5–15.5)
WBC: 13.7 10*3/uL — AB (ref 4.0–10.5)

## 2016-03-07 MED ORDER — NAPROXEN SODIUM 550 MG PO TABS: ORAL_TABLET | ORAL | Status: DC

## 2016-03-07 MED ORDER — OXYCODONE-ACETAMINOPHEN 5-325 MG PO TABS: 1.0000 | ORAL_TABLET | ORAL | Status: DC | PRN

## 2016-03-07 MED ORDER — DOXYCYCLINE HYCLATE 100 MG PO CAPS: 100.0000 mg | ORAL_CAPSULE | Freq: Two times a day (BID) | ORAL | Status: DC

## 2016-03-07 NOTE — Progress Notes (Signed)
     Review of Systems   Please see note from this morning. An addendum has been added.

## 2016-03-07 NOTE — Progress Notes (Signed)
Patient ID: Christina Mora, female   DOB: 1971-03-08, 45 y.o.   MRN: [id number] 45 y.o. [place] here c/o severe abdominal cramping X 10 weeks .   For the last 3 years she wasn't sexually active at all until January. In January she became sexually active with a female. A few hours after having an orgasm she started having severe, uterine type pain that lasted for 4-5 days. The pain was sharp and crampy, 3-9/10 in severity. She had a normal pap and negative STD testing with her primary. She was treated with antibiotics for BV, the pain got a little better, now it's back. Since then the pain has been off and on, but mostly on. She can have the pain with or without sex, but sex seems to trigger it. Since the pain started the few cycles she has had have been associated with much worse cramping. She has even missed work, this is not typical for her.  No fever, nausea, emesis, no bowel or bladder c/o.  Cycles are q 24-25 days. She can saturate a pad 3-4 x a day. 1-2 days of really bad cramps, helped with ibuprofen (800 mg). Now the 800 mg isn't helping. This pain brings her to tears.  Period Cycle (Days): 28 Period Duration (Days): 5 days  Period Pattern: Regular Menstrual Flow: Moderate, Heavy Menstrual Control: Maxi pad Dysmenorrhea: (!) Severe Dysmenorrhea Symptoms: Cramping, Throbbing  Patient's last menstrual period was 03/01/2016.          Sexually active: Yes.   (with female) The current method of family planning is none.    Exercising: Yes.    swimming sometimes  Smoker:  no  Health Maintenance: Pap:  01-26-16 WNL NEG HR HPV History of abnormal Pap:  no MMG:  12-18-15 abnormal- BX- cyst- WNL Colonoscopy:  Never BMD:   Never TDaP:  12-30-10 Gardasil: N/A   reports that she has never smoked. She has never used smokeless tobacco. She reports that she does not drink alcohol or use illicit drugs. 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 In the process of a divorce. Children  range from 16-23, 2 in college, 3 at home. All with her.   Past Medical History  Diagnosis Date  . Anovulatory     10+ years  . Fibroids   . Allergy     Loratadine PRN    Past Surgical History  Procedure Laterality Date  . Eye surgery      as a child  . Fracture surgery      R femur fracture age 33.  . Dilation and curettage of uterus      Current Outpatient Prescriptions  Medication Sig Dispense Refill  . Ascorbic Acid (VITAMIN C PO) Take by mouth.    . desloratadine (CLARINEX) 5 MG tablet Take 1 tablet (5 mg total) by mouth daily. 90 tablet 3  . ibuprofen (ADVIL,MOTRIN) 200 MG tablet Take 200 mg by mouth every 6 (six) hours as needed.    Marland Kitchen LORazepam (ATIVAN) 0.5 MG tablet Take 1 tablet (0.5 mg total) by mouth 2 (two) times daily as needed for anxiety. 30 tablet 1   No current facility-administered medications for this visit.    Family History  Problem Relation Age of Onset  . Stroke Mother   . Heart disease Mother     AMI x few with defibrillator/CHF  . COPD Mother   . Epilepsy Mother   . Obesity Mother   . Heart attack Mother   .  Multiple sclerosis Maternal Grandmother   . Cancer Paternal Grandmother   . Breast cancer Paternal Aunt   . Breast cancer Paternal Aunt   . Breast cancer Paternal Aunt   She has 6 paternal aunts, they all have cancer, 2-3 with breast, 1 with ovarian, others with cervical. Paternal grandmother with breast cancer.   Review of Systems  Constitutional: Negative.   HENT: Negative.   Eyes: Negative.   Respiratory: Negative.   Cardiovascular: Negative.   Gastrointestinal: Positive for abdominal pain.  Endocrine: Negative.   Genitourinary: Positive for pelvic pain.  Musculoskeletal: Negative.   Skin: Negative.   Allergic/Immunologic: Negative.   Neurological: Negative.   Psychiatric/Behavioral: Negative.     Exam:   BP 110/70 mmHg  Pulse 80  Resp 14  Ht 5' 7.5" (1.715 m)  Wt 217 lb (98.431 kg)  BMI 33.47 kg/m2  LMP 03/01/2016   Weight change: @WEIGHTCHANGE @ Height:   Height: 5' 7.5" (171.5 cm)  Ht Readings from Last 3 Encounters:  03/07/16 5' 7.5" (1.715 m)  02/01/16 5' 7.75" (1.721 m)  01/26/16 5' 7.5" (1.715 m)    General appearance: alert, cooperative and appears stated age Heart: regular rate and rhythm Lungs: CTAB Abdomen: soft, mildly tender BLQ, worst in the suprapubic area; bowel sounds normal; no masses,  no organomegaly Lungs: CTAB Extremities: normal, atraumatic, no cyanosis Skin: normal color, texture and turgor, no rashes or lesions Lymph: normal cervical supraclavicular and inguinal nodes Neurologic: grossly normal    Pelvic: External genitalia:  no lesions              Urethra:  normal appearing urethra with no masses, tenderness or lesions              Bartholins and Skenes: normal                 Vagina: normal appearing vagina with normal color and discharge, no lesions              Cervix: no lesions               Bimanual Exam:  Uterus:  anteverted, slightly enlarged, tender with palpation of the anterior uterus, ? fibroid.              Adnexa: no mass, fullness, tenderness               Rectovaginal: Confirms               Anus:  normal sphincter tone, no lesions  Chaperone was present for exam.  A:  Severe pelvic cramping/pain for the last 10 weeks, worsened with orgasm and her cycles. Uterus is focally tender on exam. Possible degenerating fibroid, possible endometritis.  She has had negative STD testing. No chance of pregnancy  P:   Return for a GYN ultrasound later today.   CC: Dr Reginia Forts  Addendum: U/S revealed several myomas, please see separate report. No clear signs of degenerating fibroid.  Will treat for possible endometritis. On review of her prior records she did have an elevated WBC last month. Will recheck CBC with diff F/U in 2 weeks Anaprox for pain Percocet for breakthrough pain. We discussed that percocet is not a long term option

## 2016-03-07 NOTE — Patient Instructions (Signed)
Please check if your father's side of the family has been tested for the BRCA gene. You should consider seeing a Dietitian

## 2016-03-08 ENCOUNTER — Encounter: Payer: Self-pay | Admitting: Obstetrics and Gynecology

## 2016-03-08 ENCOUNTER — Telehealth: Payer: Self-pay

## 2016-03-08 ENCOUNTER — Telehealth: Payer: Self-pay | Admitting: Medical

## 2016-03-08 NOTE — Telephone Encounter (Signed)
Called pt to ask about scheduling a physical and she informed me that she has changed primary care doctors to [facility] with Dr. Tamala Julian and she had a physical in January of this year with that doctor. I removed Audelia Acton from her PCP list in Epic.

## 2016-03-08 NOTE — Telephone Encounter (Signed)
Please call and set up a physical visit.  She has used urgent care a lot and really care should start through the primary care office.   Christina Mora

## 2016-03-08 NOTE — Telephone Encounter (Signed)
Left message to call Kaitlyn at [phone]. 

## 2016-03-08 NOTE — Telephone Encounter (Signed)
-----   Message from Salvadore Dom, MD sent at 03/08/2016  7:38 AM EST ----- Please let the patient know that she still has an elevated WBC, this implies that she has an infection. Hopefully the antibiotics will improve her symptoms. She is also slightly more anemic than a month ago. She should be on one iron tab a day (ie slow fe or ferrous gluconate 325 mg). She is supposed to f/u in 2 weeks. Thanks!

## 2016-03-08 NOTE — Telephone Encounter (Signed)
Spoke with patient. Advised of results and message as seen below from [place]. She is agreeable and verbalizes understanding. States she started taking Doxycycline last night and is still experiencing pelvic pain. Advised it can take a couple of days for the medication to get in her symptoms and start to relieve symptoms. Denies worsening symptoms. Advised if she feels her symptoms are worsening over the weekend will need to be seen at [facility]. If her symptoms remain persistent, but do not worsen over the weekend she will need to contact the office on Monday. She is agreeable. She is schedule for 2 week follow up with Dr.Jertson on 03/25/2016.  Cc: Dr.Jertson  Routing to covering provider for final review. Will close encounter.

## 2016-03-20 ENCOUNTER — Telehealth: Payer: Self-pay | Admitting: Obstetrics and Gynecology

## 2016-03-20 MED ORDER — DOXYCYCLINE HYCLATE 100 MG PO CAPS: 100.0000 mg | ORAL_CAPSULE | Freq: Two times a day (BID) | ORAL | Status: DC

## 2016-03-20 NOTE — Telephone Encounter (Signed)
Call  to patient. She states she will complete her Doxycycline tomorrow and she is just starting to feel better. She expresses concerns that since she has been having pain for so long and finally starting to feel better that if she does not continue with antibiotics that her pain will restart. She states this is typical for her to have to take a longer course of antibiotics. She states that she is finally starting to feel improved and very happy with this and does not want to pain to restart.   Patient has follow up on 03/25/16 with Dr. Talbert Nan.   Advised patient that her concerns about treatment and antibiotic use are understandable, that length of therapy with Doxycline and her diagnosis are based on what is evidenced based and that her treatment may be sufficient and that she may not need further treatment. Offered office visit with Dr. Talbert Nan to discuss her concerns, but appointment would not work with both Dr. Talbert Nan and her schedule.   Advised I will send message to Dr. Talbert Nan to review and advise.

## 2016-03-20 NOTE — Telephone Encounter (Signed)
The fact that she is just starting to feel better makes me wonder if the antibiotics are the cause of her improvement. I think the risk of continuing the antibiotics is low. Call in one more week of doxycycline and have her keep her f/u appointment next week.

## 2016-03-20 NOTE — Telephone Encounter (Signed)
Spoke with patient. Advised of message as seen below from [place]. She is agreeable and verbalizes understanding. Rx for Doxycycline 100 mg take 1 tablet BID x 7 days with food #14 0RF sent to pharmacy on file. Patient agreeable. Aware she will need to keep her follow up appointment as scheduled for 03/25/2016 with Dr.Jertson.  Routing to provider for final review. Patient agreeable to disposition. Will close encounter.

## 2016-03-20 NOTE — Telephone Encounter (Signed)
Patient is currently taking doxycycline (VIBRAMYCIN) 100 MG capsule . Patient is gradually starting to feel better and is wondering if she needs to increase the dosage due to taking so long for the antibiotic to work. Patient says she did not feel any improvement until Monday night and she has almost finish the prescription. Patient said in the past she would feel some improvement much quicker and is asking if she needs to take for antibiotic for a longer period at a higher dose. Last seen 03/07/16.

## 2016-03-25 ENCOUNTER — Ambulatory Visit (INDEPENDENT_AMBULATORY_CARE_PROVIDER_SITE_OTHER): Payer: BC Managed Care – PPO | Admitting: Obstetrics and Gynecology

## 2016-03-25 ENCOUNTER — Encounter: Payer: Self-pay | Admitting: Obstetrics and Gynecology

## 2016-03-25 VITALS — BP 130/68 | HR 84 | Resp 16 | Wt 216.0 lb

## 2016-03-25 DIAGNOSIS — R102 Pelvic and perineal pain: Secondary | ICD-10-CM

## 2016-03-25 DIAGNOSIS — D259 Leiomyoma of uterus, unspecified: Secondary | ICD-10-CM | POA: Diagnosis not present

## 2016-03-25 DIAGNOSIS — Z862 Personal history of diseases of the blood and blood-forming organs and certain disorders involving the immune mechanism: Secondary | ICD-10-CM

## 2016-03-25 LAB — CBC WITH DIFFERENTIAL/PLATELET
BASOS PCT: 0 % (ref 0–1)
Basophils Absolute: 0 10*3/uL (ref 0.0–0.1)
Eosinophils Absolute: 0 10*3/uL (ref 0.0–0.7)
Eosinophils Relative: 0 % (ref 0–5)
HEMATOCRIT: 34.7 % — AB (ref 36.0–46.0)
HEMOGLOBIN: 11.3 g/dL — AB (ref 12.0–15.0)
LYMPHS ABS: 2.4 10*3/uL (ref 0.7–4.0)
LYMPHS PCT: 18 % (ref 12–46)
MCH: 26.9 pg (ref 26.0–34.0)
MCHC: 32.6 g/dL (ref 30.0–36.0)
MCV: 82.6 fL (ref 78.0–100.0)
MONO ABS: 1 10*3/uL (ref 0.1–1.0)
MONOS PCT: 7 % (ref 3–12)
MPV: 9.1 fL (ref 8.6–12.4)
NEUTROS ABS: 10.2 10*3/uL — AB (ref 1.7–7.7)
Neutrophils Relative %: 75 % (ref 43–77)
Platelets: 440 10*3/uL — ABNORMAL HIGH (ref 150–400)
RBC: 4.2 MIL/uL (ref 3.87–5.11)
RDW: 14.5 % (ref 11.5–15.5)
WBC: 13.6 10*3/uL — ABNORMAL HIGH (ref 4.0–10.5)

## 2016-03-25 NOTE — Progress Notes (Signed)
Patient ID: Christina Mora, female   DOB: November 19, 1971, 45 y.o.   MRN: [id number] GYNECOLOGY  VISIT   HPI: 45 y.o.   Married  [place] American  female   [phone] with Patient's last menstrual period was 03/01/2016.   here to follow up on her pelvic pain. She is also c/o pain after intercourse. The patient was seen 2 weeks ago with a 10 week h/o pelvic pain that was worse after intercourse and worse with her cycle. U/S showed several small myomas. Her WBC was slightly elevated. She was treated with doxycyline for suspected endometritis. The almost constant pain is gone, stopped last week. She isn't as tired or as week, she feels more like her normal self. She is still having pain after sex, not every time. Doesn't hurt with sex, doesn't hurt for an hour or 2 after intercourse. The pain lasts for 12-24 hours. She takes one meloxicam, helps. In the last 10 days she has had sex 2 x, 1st time hurt, second time didn't.  Her cycles are tolerable, bleeding is not too heavy. She is starting her cycle today, will see what these cramps are like. This is her first cycle since taking the doxycyline.  GYNECOLOGIC HISTORY: Patient's last menstrual period was 03/01/2016. Contraception: None Menopausal hormone therapy: None        OB History    Gravida Para Term Preterm AB TAB SAB Ectopic Multiple Living   2 1 1  1  1   1          Patient Active Problem List   Diagnosis Date Noted  . Obesity, unspecified 05/17/2014  . Allergic rhinitis, cause unspecified 05/17/2014    Past Medical History  Diagnosis Date  . Anovulatory     10+ years  . Fibroids   . Allergy     Loratadine PRN    Past Surgical History  Procedure Laterality Date  . Eye surgery      as a child  . Fracture surgery      R femur fracture age 59.  . Dilation and curettage of uterus      Current Outpatient Prescriptions  Medication Sig Dispense Refill  . Ascorbic Acid (VITAMIN C PO) Take by mouth.    . desloratadine (CLARINEX) 5 MG  tablet Take 1 tablet (5 mg total) by mouth daily. 90 tablet 3  . doxycycline (VIBRAMYCIN) 100 MG capsule Take 1 capsule (100 mg total) by mouth 2 (two) times daily. Take BID for 7 days.  Take with food as can cause GI distress. 14 capsule 0  . ibuprofen (ADVIL,MOTRIN) 200 MG tablet Take 200 mg by mouth every 6 (six) hours as needed.    Marland Kitchen LORazepam (ATIVAN) 0.5 MG tablet Take 1 tablet (0.5 mg total) by mouth 2 (two) times daily as needed for anxiety. 30 tablet 1  . naproxen sodium (ANAPROX DS) 550 MG tablet 1 tab po q 12 hours prn pain 30 tablet 2  . oxyCODONE-acetaminophen (PERCOCET) 5-325 MG tablet Take 1-2 tablets by mouth every 4 (four) hours as needed. use only as much as needed to relieve pain (Patient not taking: Reported on 03/25/2016) 20 tablet 0   No current facility-administered medications for this visit.     ALLERGIES: Robaxin  Family History  Problem Relation Age of Onset  . Stroke Mother   . Heart disease Mother     AMI x few with defibrillator/CHF  . COPD Mother   . Epilepsy Mother   . Obesity Mother   .  Heart attack Mother   . Multiple sclerosis Maternal Grandmother   . Cancer Paternal Grandmother   . Breast cancer Paternal Aunt   . Breast cancer Paternal Aunt   . Breast cancer Paternal Aunt     Social History   Social History  . Marital Status: Married    Spouse Name: N/A  . Number of Children: N/A  . Years of Education: N/A   Occupational History  . BUSINESS OWNER     daycare centers x 2   Social History Main Topics  . Smoking status: Never Smoker   . Smokeless tobacco: Never Used  . Alcohol Use: No  . Drug Use: No  . Sexual Activity:    Partners: Female   Other Topics Concern  . 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    Review of Systems  Constitutional: Negative.   HENT: Negative.   Eyes: Negative.   Respiratory: Negative.   Cardiovascular: Negative.   Gastrointestinal: Negative.   Genitourinary:       Pain after intercourse   Musculoskeletal: Negative.   Skin: Negative.   Neurological: Negative.   Endo/Heme/Allergies: Negative.   Psychiatric/Behavioral: Negative.     PHYSICAL EXAMINATION:    BP 130/68 mmHg  Pulse 84  Resp 16  Wt 216 lb (97.977 kg)  LMP 03/01/2016    General appearance: alert, cooperative and appears stated age Abdomen: soft, non-tender; bowel sounds normal; no masses,  no organomegaly  Pelvic: External genitalia:  no lesions              Urethra:  normal appearing urethra with no masses, tenderness or lesions              Bartholins and Skenes: normal                 Cervix: no cervical motion tenderness              Bimanual Exam:  Uterus:  anteverted, slightly enlarged, irregular, not tender.               Adnexa: no mass, fullness, tenderness               Chaperone was present for exam.  ASSESSMENT Pelvic pain, baseline pain has stopped with course of doxycyline. No longer tender on exam Still having intermittent pain that starts 1-2 hours after sex  Fibroid uterus, cycles overall tolerable, worsening dysmenorrhea over the last few months. She has just started her first cycle since treatment with doxycycline Elevated WBC at her last visit    PLAN CBC with diff She is finishing up the doxycycline Will see how this cycle goes as far as dysmenorrhea Recommend she try taking Ibuprofen prior to having intercourse to see if that helps with the postcoital pain/cramping If her pain persists we discussed laparoscopy, depending on  her other symptoms may consider  hysterectomy    An After Visit Summary was printed and given to the patient.

## 2016-03-27 ENCOUNTER — Telehealth: Payer: Self-pay | Admitting: *Deleted

## 2016-03-27 NOTE — Telephone Encounter (Signed)
-----   Message from Salvadore Dom, MD sent at 03/27/2016 12:20 PM EDT ----- Please inform the patient that her WBC is still slightly elevated. She should see her primary for further evaluation. Please send a copy to her primary.

## 2016-03-27 NOTE — Telephone Encounter (Signed)
[place] in regards to lab results -eh

## 2016-04-01 NOTE — Telephone Encounter (Signed)
Spoke with patient. Advised of lab results and message as seen below from [place]. She is agreeable and will call to schedule a follow up appointment with her primary Christina Forts, MD. CBC results faxed to [facility] to Dr.Smith at [phone] with cover sheet and confirmation.  Routing to provider for final review. Patient agreeable to disposition. Will close encounter.

## 2016-04-01 NOTE — Telephone Encounter (Signed)
Patient is returning a call to [place]. Patient is aware Margaretha Sheffield is out of the office. Patient would like a return call today if possible from another nurse.

## 2016-04-03 ENCOUNTER — Telehealth: Payer: Self-pay | Admitting: Obstetrics and Gynecology

## 2016-04-03 NOTE — Telephone Encounter (Signed)
Spoke with patient. Patient states that she is experiencing continued pelvic pain. "It is the same as it was before." Patient states the pain has persisted intermittently since her last appointment on 03/25/2016 with Dr.Jertson. She denies any increased pain, fever, or chills. Patient has uterine fibroids. At [facility] on 3/27 Dr.Jertson recommended possible laparoscopy or hysterectomy depending on her symptoms. The patient is requesting an appointment for tomorrow 4/6 with Dr.Jertson. Appointment scheduled for 04/04/2016 at 1 pm with Dr.Jertson .She is agreeable to date and time.  Routing to provider for final review. Patient agreeable to disposition. Will close encounter.

## 2016-04-03 NOTE — Telephone Encounter (Signed)
Patient says she is having pelvic pain and symptoms like before.

## 2016-04-04 ENCOUNTER — Ambulatory Visit (INDEPENDENT_AMBULATORY_CARE_PROVIDER_SITE_OTHER): Payer: BC Managed Care – PPO | Admitting: Obstetrics and Gynecology

## 2016-04-04 ENCOUNTER — Encounter: Payer: Self-pay | Admitting: Obstetrics and Gynecology

## 2016-04-04 VITALS — BP 100/58 | HR 80 | Temp 98.4°F | Resp 14 | Wt 215.0 lb

## 2016-04-04 DIAGNOSIS — N949 Unspecified condition associated with female genital organs and menstrual cycle: Secondary | ICD-10-CM

## 2016-04-04 DIAGNOSIS — N898 Other specified noninflammatory disorders of vagina: Secondary | ICD-10-CM | POA: Diagnosis not present

## 2016-04-04 DIAGNOSIS — N760 Acute vaginitis: Secondary | ICD-10-CM | POA: Diagnosis not present

## 2016-04-04 DIAGNOSIS — R102 Pelvic and perineal pain: Secondary | ICD-10-CM

## 2016-04-04 DIAGNOSIS — A499 Bacterial infection, unspecified: Secondary | ICD-10-CM

## 2016-04-04 DIAGNOSIS — B9689 Other specified bacterial agents as the cause of diseases classified elsewhere: Secondary | ICD-10-CM

## 2016-04-04 MED ORDER — METRONIDAZOLE 500 MG PO TABS: 500.0000 mg | ORAL_TABLET | Freq: Two times a day (BID) | ORAL | Status: DC

## 2016-04-04 MED ORDER — DOXYCYCLINE HYCLATE 100 MG PO CAPS: 100.0000 mg | ORAL_CAPSULE | Freq: Two times a day (BID) | ORAL | Status: DC

## 2016-04-04 NOTE — Progress Notes (Signed)
Patient ID: Christina Mora, female   DOB: 1971/11/22, 45 y.o.   MRN: [id number] GYNECOLOGY  VISIT   HPI: 45 y.o.   Married  [place] American  female   [phone] with Patient's last menstrual period was 03/25/2016.   here c/o pelvic pain. The patient has been having pelvic pain since January, worsening dysmenorrhea, pain after sex. Multiple small myomas on ultrasound. She was treated for presumed endometritis, seemed to help her pain. She had sex again last weekend, the pain started 12-24 hours after sex and has been constant since then, building up and worsening day by day. The pain gets up to a 9/10 in severity, helped with mobic and ibuprofen together. The pain is burning, stabbing, intense pain BLQ, worst in the SP area. She has been crying the pain is so bad. No fevers, chills, nausea, vomiting, diarrhea, constipation, or urinary c/o. Feels weak and worn out.  She has had negative STD testing. Slight increase in yellowish/greenish d/c, no odor. No vulvar irritation or pruritus.   Her last cycle was okay, cramps were tolerable, prior cycle cramps were severe.   GYNECOLOGIC HISTORY: Patient's last menstrual period was 03/25/2016. Contraception:none Menopausal hormone therapy: none        OB History    Gravida Para Term Preterm AB TAB SAB Ectopic Multiple Living   2 1 1  1  1   1          Patient Active Problem List   Diagnosis Date Noted  . Obesity, unspecified 05/17/2014  . Allergic rhinitis, cause unspecified 05/17/2014    Past Medical History  Diagnosis Date  . Anovulatory     10+ years  . Fibroids   . Allergy     Loratadine PRN    Past Surgical History  Procedure Laterality Date  . Eye surgery      as a child  . Fracture surgery      R femur fracture age 63.  . Dilation and curettage of uterus      Current Outpatient Prescriptions  Medication Sig Dispense Refill  . meloxicam (MOBIC) 7.5 MG tablet Take 7.5 mg by mouth daily.    . Ascorbic Acid (VITAMIN C PO) Take by  mouth.    . desloratadine (CLARINEX) 5 MG tablet Take 1 tablet (5 mg total) by mouth daily. 90 tablet 3  . doxycycline (VIBRAMYCIN) 100 MG capsule Take 1 capsule (100 mg total) by mouth 2 (two) times daily. Take BID for 7 days.  Take with food as can cause GI distress. 14 capsule 0  . ibuprofen (ADVIL,MOTRIN) 200 MG tablet Take 200 mg by mouth every 6 (six) hours as needed.    Marland Kitchen LORazepam (ATIVAN) 0.5 MG tablet Take 1 tablet (0.5 mg total) by mouth 2 (two) times daily as needed for anxiety. 30 tablet 1  . naproxen sodium (ANAPROX DS) 550 MG tablet 1 tab po q 12 hours prn pain 30 tablet 2   No current facility-administered medications for this visit.     ALLERGIES: Robaxin  Family History  Problem Relation Age of Onset  . Stroke Mother   . Heart disease Mother     AMI x few with defibrillator/CHF  . COPD Mother   . Epilepsy Mother   . Obesity Mother   . Heart attack Mother   . Multiple sclerosis Maternal Grandmother   . Cancer Paternal Grandmother   . Breast cancer Paternal Aunt   . Breast cancer Paternal Aunt   . Breast cancer Paternal  Aunt     Social History   Social History  . Marital Status: Married    Spouse Name: N/A  . Number of Children: N/A  . Years of Education: N/A   Occupational History  . BUSINESS OWNER     daycare centers x 2   Social History Main Topics  . Smoking status: Never Smoker   . Smokeless tobacco: Never Used  . Alcohol Use: No  . Drug Use: No  . Sexual Activity:    Partners: Female   Other Topics Concern  . 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    Review of Systems  HENT: Negative.   Eyes: Negative.   Respiratory: Negative.   Cardiovascular: Negative.   Gastrointestinal: Negative.   Genitourinary:       Pelvic pain  Musculoskeletal: Negative.   Skin: Negative.   Neurological: Negative.   Endo/Heme/Allergies: Negative.   Psychiatric/Behavioral: Negative.     PHYSICAL EXAMINATION:    BP 100/58 mmHg  Pulse 80  Resp 14  Wt 215 lb (97.523 kg)  LMP 03/25/2016    General appearance: alert, cooperative and appears stated age Abdomen: soft, non-tender; bowel sounds normal; no masses,  no organomegaly  Pelvic: External genitalia:  no lesions              Urethra:  normal appearing urethra with no masses, tenderness or lesions              Bartholins and Skenes: normal                 Vagina: normal appearing vagina with normal color and discharge, no lesions              Cervix: no cervical motion tenderness and no lesions              Bimanual Exam:  Uterus:  slightly enlarged, irregularly shaped, mobile, tendeer              Adnexa: no mass, fullness, tenderness               Chaperone was present for exam.  Wet prep: few clue, no trich, few wbc KOH: no yeast PH: 4.5-5   ASSESSMENT Patient with recurrent pelvic pain after having sex with her female partner She has a mild case of BV Improved previously after being treated with doxycyline    PLAN Recommend treat with Flagyl and doxycycline F/U in 2 weeks Recommend her partner be evaluated and treated if + Will send affirm to r/o trich She has some left over percocet. Will take ibuprofen q 8 hours as needed, percocet if needed Call with any concerns   An After Visit Summary was printed and given to the patient.  25 minutes face to face time of which over 50% was spent in counseling.

## 2016-04-05 LAB — WET PREP BY MOLECULAR PROBE
Candida species: NEGATIVE
Gardnerella vaginalis: NEGATIVE
Trichomonas vaginosis: NEGATIVE

## 2016-04-09 ENCOUNTER — Other Ambulatory Visit: Payer: Self-pay | Admitting: Internal Medicine

## 2016-04-18 ENCOUNTER — Ambulatory Visit (INDEPENDENT_AMBULATORY_CARE_PROVIDER_SITE_OTHER): Payer: BC Managed Care – PPO | Admitting: Obstetrics and Gynecology

## 2016-04-18 ENCOUNTER — Encounter: Payer: Self-pay | Admitting: Obstetrics and Gynecology

## 2016-04-18 VITALS — BP 100/60 | HR 80 | Resp 16 | Wt 215.0 lb

## 2016-04-18 DIAGNOSIS — N912 Amenorrhea, unspecified: Secondary | ICD-10-CM

## 2016-04-18 DIAGNOSIS — N946 Dysmenorrhea, unspecified: Secondary | ICD-10-CM | POA: Diagnosis not present

## 2016-04-18 DIAGNOSIS — R102 Pelvic and perineal pain: Secondary | ICD-10-CM

## 2016-04-18 DIAGNOSIS — D259 Leiomyoma of uterus, unspecified: Secondary | ICD-10-CM

## 2016-04-18 DIAGNOSIS — N858 Other specified noninflammatory disorders of uterus: Secondary | ICD-10-CM | POA: Diagnosis not present

## 2016-04-18 MED ORDER — NORETHIN-ETH ESTRAD-FE BIPHAS 1 MG-10 MCG / 10 MCG PO TABS: 1.0000 | ORAL_TABLET | Freq: Every day | ORAL | Status: DC

## 2016-04-18 NOTE — Patient Instructions (Signed)
Oral Contraception Information Oral contraceptive pills (OCPs) are medicines taken to prevent pregnancy. OCPs work by preventing the ovaries from releasing eggs. The hormones in OCPs also cause the cervical mucus to thicken, preventing the sperm from entering the uterus. The hormones also cause the uterine lining to become thin, not allowing a fertilized egg to attach to the inside of the uterus. OCPs are highly effective when taken exactly as prescribed. However, OCPs do not prevent sexually transmitted diseases (STDs). Safe sex practices, such as using condoms along with the pill, can help prevent STDs.  Before taking the pill, you may have a physical exam and Pap test. Your health care provider may order blood tests. The health care provider will make sure you are a good candidate for oral contraception. Discuss with your health care provider the possible side effects of the OCP you may be prescribed. When starting an OCP, it can take 2 to 3 months for the body to adjust to the changes in hormone levels in your body.  TYPES OF ORAL CONTRACEPTION  The combination pill--This pill contains estrogen and progestin (synthetic progesterone) hormones. The combination pill comes in 21-day, 28-day, or 91-day packs. Some types of combination pills are meant to be taken continuously (365-day pills). With 21-day packs, you do not take pills for 7 days after the last pill. With 28-day packs, the pill is taken every day. The last 7 pills are without hormones. Certain types of pills have more than 21 hormone-containing pills. With 91-day packs, the first 84 pills contain both hormones, and the last 7 pills contain no hormones or contain estrogen only.  The minipill--This pill contains the progesterone hormone only. The pill is taken every day continuously. It is very important to take the pill at the same time each day. The minipill comes in packs of 28 pills. All 28 pills contain the hormone.  ADVANTAGES OF ORAL  CONTRACEPTIVE PILLS  Decreases premenstrual symptoms.   Treats menstrual period cramps.   Regulates the menstrual cycle.   Decreases a heavy menstrual flow.   May treatacne, depending on the type of pill.   Treats abnormal uterine bleeding.   Treats polycystic ovarian syndrome.   Treats endometriosis.   Can be used as emergency contraception.  THINGS THAT CAN MAKE ORAL CONTRACEPTIVE PILLS LESS EFFECTIVE OCPs can be less effective if:   You forget to take the pill at the same time every day.   You have a stomach or intestinal disease that lessens the absorption of the pill.   You take OCPs with other medicines that make OCPs less effective, such as antibiotics, certain HIV medicines, and some seizure medicines.   You take expired OCPs.   You forget to restart the pill on day 7, when using the packs of 21 pills.  RISKS ASSOCIATED WITH ORAL CONTRACEPTIVE PILLS  Oral contraceptive pills can sometimes cause side effects, such as:  Headache.  Nausea.  Breast tenderness.  Irregular bleeding or spotting. Combination pills are also associated with a small increased risk of:  Blood clots.  Heart attack.  Stroke.   This information is not intended to replace advice given to you by your health care provider. Make sure you discuss any questions you have with your health care provider.   Document Released: 03/08/2003 Document Revised: 10/06/2013 Document Reviewed: 06/06/2013 Elsevier Interactive Patient Education [employer].

## 2016-04-18 NOTE — Progress Notes (Signed)
Patient ID: Christina Mora, female   DOB: 1971/11/09, 45 y.o.   MRN: [id number] GYNECOLOGY  VISIT   HPI: 45 y.o.   Married  [place] American  female   [phone] with Patient's last menstrual period was 03/25/2016.   here for follow up of pelvic pain. She was treated with antibiotics for BV and possible endometritis. She isn't feeling better. Initially had the pain only after intercourse. Now she is having the pain even in between. She has severe dysmenorrhea, but is bothered more by the pain in between her cycles. She does have fibroids and mild anemia.   GYNECOLOGIC HISTORY: Patient's last menstrual period was 03/25/2016. Contraception:none  Menopausal hormone therapy: none         OB History    Gravida Para Term Preterm AB TAB SAB Ectopic Multiple Living   2 1 1  1  1   1          Patient Active Problem List   Diagnosis Date Noted  . Obesity, unspecified 05/17/2014  . Allergic rhinitis, cause unspecified 05/17/2014    Past Medical History  Diagnosis Date  . Anovulatory     10+ years  . Fibroids   . Allergy     Loratadine PRN    Past Surgical History  Procedure Laterality Date  . Eye surgery      as a child  . Fracture surgery      R femur fracture age 40.  . Dilation and curettage of uterus      Current Outpatient Prescriptions  Medication Sig Dispense Refill  . Ascorbic Acid (VITAMIN C PO) Take by mouth.    . desloratadine (CLARINEX) 5 MG tablet Take 1 tablet (5 mg total) by mouth daily. 90 tablet 3  . doxycycline (VIBRAMYCIN) 100 MG capsule Take 1 capsule (100 mg total) by mouth 2 (two) times daily. Take BID for 14 days.  Take with food as can cause GI distress. 28 capsule 0  . ibuprofen (ADVIL,MOTRIN) 200 MG tablet Take 200 mg by mouth every 6 (six) hours as needed.    Marland Kitchen LORazepam (ATIVAN) 0.5 MG tablet Take 1 tablet (0.5 mg total) by mouth 2 (two) times daily as needed for anxiety. 30 tablet 1  . meloxicam (MOBIC) 7.5 MG tablet Take 7.5 mg by mouth daily.    .  naproxen sodium (ANAPROX DS) 550 MG tablet 1 tab po q 12 hours prn pain 30 tablet 2   No current facility-administered medications for this visit.     ALLERGIES: Robaxin  Family History  Problem Relation Age of Onset  . Stroke Mother   . Heart disease Mother     AMI x few with defibrillator/CHF  . COPD Mother   . Epilepsy Mother   . Obesity Mother   . Heart attack Mother   . Multiple sclerosis Maternal Grandmother   . Cancer Paternal Grandmother   . Breast cancer Paternal Aunt   . Breast cancer Paternal Aunt   . Breast cancer Paternal Aunt     Social History   Social History  . Marital Status: Married    Spouse Name: N/A  . Number of Children: N/A  . Years of Education: N/A   Occupational History  . BUSINESS OWNER     daycare centers x 2   Social History Main Topics  . Smoking status: Never Smoker   . Smokeless tobacco: Never Used  . Alcohol Use: No  . Drug Use: No  . Sexual Activity:  Partners: Female   Other Topics Concern  . 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    Review of Systems  Constitutional: Negative.   HENT: Negative.   Eyes: Negative.   Respiratory: Negative.   Cardiovascular: Negative.   Gastrointestinal: Negative.   Genitourinary:       Pelvic pain   Musculoskeletal: Negative.   Skin: Negative.   Neurological: Negative.   Endo/Heme/Allergies: Negative.   Psychiatric/Behavioral: Negative.     PHYSICAL EXAMINATION:    BP 100/60 mmHg  Pulse 80  Resp 16  Wt 215 lb (97.523 kg)  LMP 03/25/2016     General appearance: alert, cooperative and appears stated age Abdomen: soft, non-tender; bowel sounds normal; no masses,  no organomegaly  Pelvic: External genitalia:  no lesions              Urethra:  normal appearing urethra with no masses, tenderness or lesions              Bartholins and Skenes: normal                 Vagina: normal appearing vagina with normal color and discharge, no lesions              Cervix: no lesions              Bimanual Exam:  Uterus:  anteverted, irregular, tender to palpation              Adnexa: no mass, fullness, tenderness   The risks of endometrial biopsy were reviewed and a consent was obtained.  A speculum was placed in the vagina and the cervix was cleansed with betadine. A tenaculum was placed on the cervix and the mini-pipelle was placed into the endometrial cavity. The uterus sounded to 8 cm. The endometrial biopsy was performed, moderate tissue was obtained. The tenaculum and speculum were removed. There were no complications.   Chaperone was present for exam.  ASSESSMENT Pelvic pain, fibroid uterus, uterine tenderness. Not helped with antibiotics Mild anemia    PLAN Discussed options of OCP's, trial of lupron, hysterectomy. Given her persistent pain and uterine tenderness I think she will ultimately need a hysterectomy Endometrial biopsy done Trial of LoLoestrin, no contraindications, risks reviewed She should be on iron F/U in 3 months, sooner with questions Encouraged her to get a second opinion   An After Visit Summary was printed and given to the patient.  40 minutes face to face time of which over 50% was spent in counseling.

## 2016-04-22 ENCOUNTER — Other Ambulatory Visit: Payer: Self-pay | Admitting: Internal Medicine

## 2016-04-24 ENCOUNTER — Telehealth: Payer: Self-pay | Admitting: Obstetrics and Gynecology

## 2016-04-24 NOTE — Telephone Encounter (Signed)
Routing to Lamont Snowball, RN for review and advise.

## 2016-04-24 NOTE — Telephone Encounter (Signed)
Patient is asking for a consult appointment with Dr.Silva for a second opinion. Patient was last seen 04/18/16 with Dr.Jertson.

## 2016-04-24 NOTE — Telephone Encounter (Signed)
Per Dr Gentry Fitz office note from 04-18-16, offered 2nd opinion due to persistent pelvic pain.  Reviewed with Dr Talbert Nan, Patient has been treated with antibiotics X2, very tender uterus on exam, persistent pelvic pain, has uterine fibroids and declines surgery.  Dr Talbert Nan recommended second opinion with provider of patient choice, here or outside of practice. Call to patient, states she just wants another opinion as Dr Talbert Nan suggested. Requests appointment with Dr Quincy Simmonds. Also interested in another pelvic ultrasound. Advised will schedule appointment with dr Quincy Simmonds and then determine next step. Appointment 05-01-16 at 3pm with Dr Quincy Simmonds.  Routing to provider for final review. Patient agreeable to disposition. Will close encounter.    CC: Dr Quincy Simmonds

## 2016-05-01 ENCOUNTER — Encounter: Payer: Self-pay | Admitting: Obstetrics and Gynecology

## 2016-05-01 ENCOUNTER — Ambulatory Visit (INDEPENDENT_AMBULATORY_CARE_PROVIDER_SITE_OTHER): Payer: BC Managed Care – PPO | Admitting: Obstetrics and Gynecology

## 2016-05-01 VITALS — BP 110/70 | HR 84 | Temp 100.0°F | Ht 67.5 in | Wt 216.2 lb

## 2016-05-01 DIAGNOSIS — R319 Hematuria, unspecified: Secondary | ICD-10-CM

## 2016-05-01 DIAGNOSIS — D259 Leiomyoma of uterus, unspecified: Secondary | ICD-10-CM

## 2016-05-01 DIAGNOSIS — N760 Acute vaginitis: Secondary | ICD-10-CM | POA: Diagnosis not present

## 2016-05-01 DIAGNOSIS — N949 Unspecified condition associated with female genital organs and menstrual cycle: Secondary | ICD-10-CM | POA: Diagnosis not present

## 2016-05-01 DIAGNOSIS — R102 Pelvic and perineal pain: Secondary | ICD-10-CM | POA: Diagnosis not present

## 2016-05-01 LAB — POCT URINALYSIS DIPSTICK
Bilirubin, UA: NEGATIVE
GLUCOSE UA: NEGATIVE
Ketones, UA: NEGATIVE
Leukocytes, UA: NEGATIVE
NITRITE UA: NEGATIVE
PROTEIN UA: NEGATIVE
UROBILINOGEN UA: NEGATIVE
pH, UA: 5

## 2016-05-01 LAB — CBC WITH DIFFERENTIAL/PLATELET
BASOS PCT: 0 %
Basophils Absolute: 0 cells/uL (ref 0–200)
EOS PCT: 1 %
Eosinophils Absolute: 133 cells/uL (ref 15–500)
HEMATOCRIT: 32.8 % — AB (ref 35.0–45.0)
Hemoglobin: 10.6 g/dL — ABNORMAL LOW (ref 11.7–15.5)
LYMPHS ABS: 2793 {cells}/uL (ref 850–3900)
LYMPHS PCT: 21 %
MCH: 26.9 pg — ABNORMAL LOW (ref 27.0–33.0)
MCHC: 32.3 g/dL (ref 32.0–36.0)
MCV: 83.2 fL (ref 80.0–100.0)
MONO ABS: 665 {cells}/uL (ref 200–950)
MPV: 9 fL (ref 7.5–12.5)
Monocytes Relative: 5 %
NEUTROS PCT: 73 %
Neutro Abs: 9709 cells/uL — ABNORMAL HIGH (ref 1500–7800)
Platelets: 436 10*3/uL — ABNORMAL HIGH (ref 140–400)
RBC: 3.94 MIL/uL (ref 3.80–5.10)
RDW: 14.4 % (ref 11.0–15.0)
WBC: 13.3 10*3/uL — AB (ref 3.8–10.8)

## 2016-05-01 MED ORDER — METRONIDAZOLE 500 MG PO TABS: 500.0000 mg | ORAL_TABLET | Freq: Two times a day (BID) | ORAL | Status: DC

## 2016-05-01 MED ORDER — CEFTRIAXONE SODIUM 1 G IJ SOLR
250.0000 mg | Freq: Once | INTRAMUSCULAR | Status: AC
  Administered 2016-05-01: 250 mg via INTRAMUSCULAR

## 2016-05-01 MED ORDER — DOXYCYCLINE HYCLATE 100 MG PO CAPS: 100.0000 mg | ORAL_CAPSULE | Freq: Two times a day (BID) | ORAL | Status: DC

## 2016-05-01 MED ORDER — LIDOCAINE HCL 1 % IJ SOLN: 250.0000 mg | Freq: Once | INTRAMUSCULAR | Status: DC

## 2016-05-01 NOTE — Progress Notes (Signed)
Patient ID: Christina Mora, female   DOB: 05-Apr-1971, 45 y.o.   MRN: [id number] GYNECOLOGY  VISIT   HPI: 45 y.o.   Married  [place] American  female   [phone] with Patient's last menstrual period was 03/25/2016.   here for second opinion regarding pelvic pain x4 months.  Patient concerned may have UTI and requesting urinalysis.  Dr. Talbert Nan is the patient's primary GYN who has been evaluating and treating to date.  Pain started in January after not being sexually active for 3 years.  States she develops a cyclic pain pattern following sexual activity. Now with a female partner.   Has vagina in hand contact with her partner and develops pain 1 - 2 days later.    Pain is increasing in nature and it becomes unbearable, icy hot.  Pain located into her hip region and the center of her pelvis. Can have greenish discharge as well, which she is having today accompanied by vaginal burning. She is concerned about cancer.  She is also concerned about infection as her female partner bites her fingernails and cuticles to the point of bleeding.  Not receiving oral sex and is not using any sexual toys.  Feels tired and not well overall.  Denies diarrhea, constipation, blood in the stool.  BM daily.   No dysuria. No history of renal stones.   Now with normal menses.  No significant bleeding.  Has cramping a scale of 6 controlled with ibuprofen.  Cramps are much worse during her cycle. If she is going through her post coital pain episode and having her menses, the cramping is much worse. Meloxicam and Advil make her pain better.   Patient had had negative STD testing in January 2017 through urgent care. Wet prep at that time showed some clue cells, which were treated with metronidazole.  Patient has known fibroids which have been asymptomatic. The patient had a recent pelvic ultrasound with Dr. Talbert Nan on March 9, and this showed several subserosal and intramural fibroids with the largest being  4.3 cm.  No fibroid degeneration. Ovaries were normal. Was treated then with 14 day course of doxycycline for endometritis due to pain on pelvic exam and elevated WBC 13,000 with a left shift.   Pain tends to improve with abx, and then the pattern returns after sexual activity.  Had a second treatment for endometritis with 14 day course of doxycycline accompanied by a week long course of Flagyl.  Partner has been tested for STDs, and this was reported to be negative.   Patient had an EMB 04/18/16 showing benign secretory endometrium.  Patient's desire is to avoid hysterectomy.  Has just started a course of combined oral contraceptives to try to treat pain.   Status post D & C x 3 for bleeding in the past.   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   Some exposure to strep and mono possibly. At home, has teens and children in college.  Urine Dip today:Trace RBCs  GYNECOLOGIC HISTORY: Patient's last menstrual period was 03/25/2016. Contraception: Female partner Menopausal hormone therapy:  none Last mammogram:  12-12-15 3D--see Epic Last pap smear:   01-26-16 Neg:Neg HR HPV        OB History    Gravida Para Term Preterm AB TAB SAB Ectopic Multiple Living   2 1 1  1  1   1          Patient Active Problem List   Diagnosis Date Noted  . Obesity, unspecified 05/17/2014  .  Allergic rhinitis, cause unspecified 05/17/2014    Past Medical History  Diagnosis Date  . Anovulatory     10+ years  . Fibroids   . Allergy     Loratadine PRN    Past Surgical History  Procedure Laterality Date  . Eye surgery      as a child  . Fracture surgery      R femur fracture age 40.  . Dilation and curettage of uterus      Current Outpatient Prescriptions  Medication Sig Dispense Refill  . Ascorbic Acid (VITAMIN C PO) Take by mouth.    . desloratadine (CLARINEX) 5 MG tablet Take 1 tablet (5 mg total) by mouth daily. 90 tablet 3  . ibuprofen (ADVIL,MOTRIN) 200 MG tablet Take 200 mg by mouth every 6  (six) hours as needed.    Marland Kitchen LORazepam (ATIVAN) 0.5 MG tablet Take 1 tablet (0.5 mg total) by mouth 2 (two) times daily as needed for anxiety. 30 tablet 1  . meloxicam (MOBIC) 7.5 MG tablet Take 7.5 mg by mouth daily.    . naproxen sodium (ANAPROX DS) 550 MG tablet 1 tab po q 12 hours prn pain 30 tablet 2  . Norethindrone-Ethinyl Estradiol-Fe Biphas (LO LOESTRIN FE) 1 MG-10 MCG / 10 MCG tablet Take 1 tablet by mouth daily. 3 Package 0  . doxycycline (VIBRAMYCIN) 100 MG capsule Take 1 capsule (100 mg total) by mouth 2 (two) times daily. Take BID for 14 days.  Take with food as can cause GI distress. 28 capsule 0  . metroNIDAZOLE (FLAGYL) 500 MG tablet Take 1 tablet (500 mg total) by mouth 2 (two) times daily. 28 tablet 0   No current facility-administered medications for this visit.     ALLERGIES: Robaxin  Family History  Problem Relation Age of Onset  . Stroke Mother   . Heart disease Mother     AMI x few with defibrillator/CHF  . COPD Mother   . Epilepsy Mother   . Obesity Mother   . Heart attack Mother   . Multiple sclerosis Maternal Grandmother   . Cancer Paternal Grandmother   . Breast cancer Paternal Aunt   . Breast cancer Paternal Aunt   . Breast cancer Paternal Aunt     Social History   Social History  . Marital Status: Married    Spouse Name: N/A  . Number of Children: N/A  . Years of Education: N/A   Occupational History  . BUSINESS OWNER     daycare centers x 2   Social History Main Topics  . Smoking status: Never Smoker   . Smokeless tobacco: Never Used  . Alcohol Use: No  . Drug Use: No  . Sexual Activity:    Partners: Female   Other Topics Concern  . 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  ROS:  Pertinent items are noted in HPI.  PHYSICAL EXAMINATION:    BP 110/70 mmHg  Pulse 84  Temp(Src) 100 F (37.8 C) (Oral)  Ht 5' 7.5" (1.715 m)  Wt 216 lb 3.2 oz (98.068 kg)  BMI 33.34 kg/m2  LMP 03/25/2016    General appearance: alert, cooperative and appears stated age Head: Normocephalic, without obvious abnormality, atraumatic Neck: no adenopathy, supple, symmetrical, trachea midline and thyroid normal to inspection and palpation Lungs: clear to auscultation bilaterally  Heart: regular rate and rhythm Abdomen: incision(s):No.  soft, mildly tender across lower abdomen, no masses,  no organomegaly Extremities: extremities normal, atraumatic, no cyanosis or edema Skin: Skin color, texture, turgor normal. No rashes or lesions. Warm to the touch. No abnormal inguinal nodes palpated Neurologic: Grossly normal  Pelvic: External genitalia:  no lesions              Urethra:  normal appearing urethra with no masses, tenderness or lesions              Bartholins and Skenes: normal                 Vagina: normal appearing vagina with normal color and discharge, no lesions              Cervix: no lesions and green yellow discharge from the os.  No CMT         Bimanual Exam:  Uterus:  enlarged, 15 weeks size and mildly tender.                Adnexa: no mass, fullness, tenderness              Rectal exam: Yes.  .  Confirms.              Anus:  normal sphincter tone, no lesions  Chaperone was present for exam.  ASSESSMENT  Pelvic pain.  Fibroid uterus.  Hx of leukocytosis. Fever. Vaginitis/cervicitis.  Microscopic hematuria. Clinical picture today most consistent with pelvic infection. No acute abdomen.  PLAN  Will treat for PID with a combined regimen of Ceftriaxone 250 mg IM now, doxycycline 100 mg  po bid for 14 days, Flagyl 500 mg po bid for 14 days.  CBC with differential, Affirm testing, GC/CT, urine micro and culture. Discussion of fibroids and natural history. Discussed chronic pelvic infection and potential treatment with hysterectomy +/- oophorectomy. Abstain from sexual activity during treatment.  Discussed use of gloves for partner for sexual activity or use of a female condom in the future. Continue with contraceptives. Return in 2 weeks for a recheck.    An After Visit Summary was printed and given to the patient.  __40____ minutes face to face time of which over 50% was spent in counseling.

## 2016-05-02 LAB — URINALYSIS, MICROSCOPIC ONLY
BACTERIA UA: NONE SEEN [HPF]
Casts: NONE SEEN [LPF]
Crystals: NONE SEEN [HPF]
YEAST: NONE SEEN [HPF]

## 2016-05-02 LAB — GC/CHLAMYDIA PROBE AMP
CT PROBE, AMP APTIMA: NOT DETECTED
GC PROBE AMP APTIMA: NOT DETECTED

## 2016-05-02 LAB — WET PREP BY MOLECULAR PROBE
Candida species: NEGATIVE
GARDNERELLA VAGINALIS: NEGATIVE
Trichomonas vaginosis: NEGATIVE

## 2016-05-03 LAB — URINE CULTURE
Colony Count: NO GROWTH
ORGANISM ID, BACTERIA: NO GROWTH

## 2016-05-07 ENCOUNTER — Telehealth: Payer: Self-pay | Admitting: Obstetrics and Gynecology

## 2016-05-07 DIAGNOSIS — H25013 Cortical age-related cataract, bilateral: Secondary | ICD-10-CM | POA: Diagnosis not present

## 2016-05-07 DIAGNOSIS — H5315 Visual distortions of shape and size: Secondary | ICD-10-CM | POA: Diagnosis not present

## 2016-05-07 DIAGNOSIS — H10413 Chronic giant papillary conjunctivitis, bilateral: Secondary | ICD-10-CM | POA: Diagnosis not present

## 2016-05-07 NOTE — Telephone Encounter (Signed)
Patient complains of "extremely blurry vision" she said may be related to her Lo-Loestrin. She is concerned and wants to speak with the nurse. She went to the eye doctor today but "didn't get any answers."

## 2016-05-07 NOTE — Telephone Encounter (Signed)
I would not expect a loss of close up visual acuity with use of her combined oral contraceptives or the antibiotics to treat her pelvic pain.  I agree with her having had consultation with her opthalmologist.  I would recommend keeping the appointment for follow up of her pain and treatment for pelvic infection. There are estrogen free options to treat pain, meaning progesterone only oral contraceptives and Depo Provera.  Just treatments to consider.

## 2016-05-07 NOTE — Telephone Encounter (Signed)
Call to patient. She states she has been taking Loloestrin for about two weeks now and feels "almost 100% better" in regards to her pelvic pain. She states that about 4 days ago, her vision began to change and it has worsened in the R side. She saw her opthamologist today and exam was normal, eye pressures were normal per patient but visual acuity at close distances with printed word is much worse. She states that her eye doctor does not believe it is related to her OCP.   She states she will stop her pills with tonights dose but wants to know if Dr. Quincy Simmonds knows if this is a common side effect of Loloestrin or potentially from use of antibiotics since January related to her pelvic pain.  Patient denies headaches. No BP issues per patient.  She declines office visit. Requests message be sent to Dr. Quincy Simmonds for advice. Advised to call 911 or seek care at closest emergency department if develops any worsening decrease in vision or weakness of extremities or headache. Patient agreeable to instructions.

## 2016-05-08 NOTE — Telephone Encounter (Signed)
-----   Message from Nunzio Cobbs, MD sent at 05/04/2016  6:35 AM EDT ----- Please report results to patient. GC/CT, Affirm, and UC are negative. I would like for her to finish the course of the antibiotics. She has an elevated WBC, which is stable for her.  She is also anemic. I am recommending she take iron sulfate 325 mg po bid with orange juice.  Please keep follow up appointment with me.

## 2016-05-08 NOTE — Telephone Encounter (Signed)
Patient called and messages from Dr. Quincy Simmonds discussed, lab results and phone message given.  Patient states she had a friend help her with reading the Internet last night and feels her visual changes may be due to taking Doxycycline. She has taken 6 days of the current course prescribed to her. She states that she took her Loloestrin FE last night and did not take her Doxycycline dose last night.  She is asking to speak with Dr. Quincy Simmonds via phone.  I advised that with her concerns about medications and their potential side effects and patient report of missing work due to visual changes and patient request to take "something other than doxycycline" that an office visit is most appropriate. Advised Dr. Quincy Simmonds can recheck pelvic pain symptoms and advise if any other medications will meet her needs.  Patient agreeable and requests office visit tomorrow 05/09/16. Scheduled office visit and patient is to call back with any concerns prior to appointment tomorrow with Dr. Quincy Simmonds.  Patient verbalized understanding of lab results and comparison of other lab results discussed.  Routing to provider for final review. Patient agreeable to disposition. Will close encounter.

## 2016-05-09 ENCOUNTER — Ambulatory Visit: Payer: BC Managed Care – PPO | Admitting: Obstetrics and Gynecology

## 2016-05-09 ENCOUNTER — Telehealth: Payer: Self-pay | Admitting: Obstetrics and Gynecology

## 2016-05-09 ENCOUNTER — Ambulatory Visit (INDEPENDENT_AMBULATORY_CARE_PROVIDER_SITE_OTHER): Payer: BC Managed Care – PPO | Admitting: Obstetrics and Gynecology

## 2016-05-09 ENCOUNTER — Encounter: Payer: Self-pay | Admitting: Obstetrics and Gynecology

## 2016-05-09 VITALS — BP 110/68 | HR 88 | Ht 67.5 in | Wt 217.0 lb

## 2016-05-09 DIAGNOSIS — H539 Unspecified visual disturbance: Secondary | ICD-10-CM | POA: Diagnosis not present

## 2016-05-09 DIAGNOSIS — M25559 Pain in unspecified hip: Secondary | ICD-10-CM | POA: Diagnosis not present

## 2016-05-09 MED ORDER — NORETHINDRONE 0.35 MG PO TABS: 1.0000 | ORAL_TABLET | Freq: Every day | ORAL | Status: DC

## 2016-05-09 NOTE — Telephone Encounter (Addendum)
Left message regarding upcoming appointment for "pelvic pain"  TODAY for pelvic pain has been canceled and needs to be rescheduled.  *PER BS TRY TO WORK THIS PT. IN LATER TODAY.

## 2016-05-09 NOTE — Progress Notes (Signed)
Patient ID: Christina Mora, female   DOB: Feb 11, 1971, 45 y.o.   MRN: [id number] GYNECOLOGY  VISIT   HPI: 45 y.o.   Married  [place] American  female   [phone] with Patient's last menstrual period was 04/19/2016 (approximate).   here for blurred vision and concerned it is related to her new medications.    States she is unable to see text on paper since this weekend.  Using reader glasses but it seems blurry.  Saw opthalmologist. Normal retina.   Stopped Doxycycline after doing internet reading.  Stopped for about 3 days and did not get an improvement in her vision.  Pain then increased and the green vaginal discharge increased.  Just restarted the Doxycycline again today.   Took combined oral contraceptives in the past and had nausea.  Still on LoLoEstrin now.   Does feel like her pain is much better, which is usually what happens with the abx.  Still very conflicted about hysterectomy or not.   GYNECOLOGIC HISTORY: Patient's last menstrual period was 04/19/2016 (approximate). Contraception: Female partner/LoLoEstrin  Menopausal hormone therapy:  None Last mammogram: 12-12-15 3D--see Epic Last pap smear:  01-26-16 Neg:Neg HR HPV         OB History    Gravida Para Term Preterm AB TAB SAB Ectopic Multiple Living   2 1 1  1  1   1          Patient Active Problem List   Diagnosis Date Noted  . Obesity, unspecified 05/17/2014  . Allergic rhinitis, cause unspecified 05/17/2014    Past Medical History  Diagnosis Date  . Anovulatory     10+ years  . Fibroids   . Allergy     Loratadine PRN    Past Surgical History  Procedure Laterality Date  . Eye surgery      as a child  . Fracture surgery      R femur fracture age 44.  . Dilation and curettage of uterus      Current Outpatient Prescriptions  Medication Sig Dispense Refill  . Ascorbic Acid (VITAMIN C PO) Take by mouth.    . desloratadine (CLARINEX) 5 MG tablet Take 1 tablet (5 mg total) by mouth daily. 90 tablet 3   . doxycycline (VIBRAMYCIN) 100 MG capsule Take 1 capsule (100 mg total) by mouth 2 (two) times daily. Take BID for 14 days.  Take with food as can cause GI distress. 28 capsule 0  . ibuprofen (ADVIL,MOTRIN) 200 MG tablet Take 200 mg by mouth every 6 (six) hours as needed.    Marland Kitchen LORazepam (ATIVAN) 0.5 MG tablet Take 1 tablet (0.5 mg total) by mouth 2 (two) times daily as needed for anxiety. 30 tablet 1  . meloxicam (MOBIC) 7.5 MG tablet Take 7.5 mg by mouth daily.    . metroNIDAZOLE (FLAGYL) 500 MG tablet Take 1 tablet (500 mg total) by mouth 2 (two) times daily. 28 tablet 0  . naproxen sodium (ANAPROX DS) 550 MG tablet 1 tab po q 12 hours prn pain 30 tablet 2  . Norethindrone-Ethinyl Estradiol-Fe Biphas (LO LOESTRIN FE) 1 MG-10 MCG / 10 MCG tablet Take 1 tablet by mouth daily. 3 Package 0   No current facility-administered medications for this visit.     ALLERGIES: Robaxin  Family History  Problem Relation Age of Onset  . Stroke Mother   . Heart disease Mother     AMI x few with defibrillator/CHF  . COPD Mother   . Epilepsy Mother   .  Obesity Mother   . Heart attack Mother   . Multiple sclerosis Maternal Grandmother   . Cancer Paternal Grandmother   . Breast cancer Paternal Aunt   . Breast cancer Paternal Aunt   . Breast cancer Paternal Aunt     Social History   Social History  . Marital Status: Married    Spouse Name: N/A  . Number of Children: N/A  . Years of Education: N/A   Occupational History  . BUSINESS OWNER     daycare centers x 2   Social History Main Topics  . Smoking status: Never Smoker   . Smokeless tobacco: Never Used  . Alcohol Use: No  . Drug Use: No  . Sexual Activity:    Partners: Female     Comment: LoLoEstrin   Other Topics Concern  . 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    ROS:  Pertinent items are noted in HPI.  PHYSICAL EXAMINATION:    BP 110/68 mmHg  Pulse 88  Ht 5' 7.5" (1.715 m)  Wt 217 lb (98.431 kg)  BMI 33.47 kg/m2  LMP 04/19/2016 (Approximate)    General appearance: alert, cooperative and appears stated age.  ASSESSMENT  Visual change.  Etiology unclear.  Negative opthalmologic work up.  Pelvic pain.  Presumptive PID.  Doing 2 week course of Doxycycline and Flagyl following Ceftriaxone.  Uterine fibroids.  PLAN  Stop LoLoEstrin.  Start Micronor this weekend. Instructed in used.  Avoid sexual activity while completing treatment of PID with 2 week course of abx.  Partner will use protection for digital sexual activity in future. Discussion that hysterectomy would be likely to resolve pelvic pain (as source is pointing to her uterus) but that vaginitis can recur.  Follow up for a recheck after completes abx.     An After Visit Summary was printed and given to the patient.  __25____ minutes face to face time of which over 50% was spent in counseling.

## 2016-05-09 NOTE — Telephone Encounter (Signed)
Please try to work patient in for later today.

## 2016-05-15 ENCOUNTER — Ambulatory Visit (INDEPENDENT_AMBULATORY_CARE_PROVIDER_SITE_OTHER): Payer: BC Managed Care – PPO | Admitting: Obstetrics and Gynecology

## 2016-05-15 ENCOUNTER — Encounter: Payer: Self-pay | Admitting: Obstetrics and Gynecology

## 2016-05-15 VITALS — BP 118/70 | HR 80 | Ht 67.5 in | Wt 215.6 lb

## 2016-05-15 DIAGNOSIS — D259 Leiomyoma of uterus, unspecified: Secondary | ICD-10-CM

## 2016-05-15 DIAGNOSIS — H538 Other visual disturbances: Secondary | ICD-10-CM

## 2016-05-15 DIAGNOSIS — D72829 Elevated white blood cell count, unspecified: Secondary | ICD-10-CM | POA: Diagnosis not present

## 2016-05-15 LAB — CBC WITH DIFFERENTIAL/PLATELET
BASOS ABS: 0 {cells}/uL (ref 0–200)
Basophils Relative: 0 %
EOS PCT: 1 %
Eosinophils Absolute: 130 cells/uL (ref 15–500)
HEMATOCRIT: 35.2 % (ref 35.0–45.0)
HEMOGLOBIN: 11.2 g/dL — AB (ref 11.7–15.5)
LYMPHS ABS: 2080 {cells}/uL (ref 850–3900)
LYMPHS PCT: 16 %
MCH: 25.9 pg — AB (ref 27.0–33.0)
MCHC: 31.8 g/dL — AB (ref 32.0–36.0)
MCV: 81.5 fL (ref 80.0–100.0)
MPV: 9.1 fL (ref 7.5–12.5)
Monocytes Absolute: 910 cells/uL (ref 200–950)
Monocytes Relative: 7 %
NEUTROS PCT: 76 %
Neutro Abs: 9880 cells/uL — ABNORMAL HIGH (ref 1500–7800)
Platelets: 446 10*3/uL — ABNORMAL HIGH (ref 140–400)
RBC: 4.32 MIL/uL (ref 3.80–5.10)
RDW: 14.4 % (ref 11.0–15.0)
WBC: 13 10*3/uL — AB (ref 3.8–10.8)

## 2016-05-15 LAB — BASIC METABOLIC PANEL
BUN: 16 mg/dL (ref 7–25)
CALCIUM: 9.4 mg/dL (ref 8.6–10.2)
CHLORIDE: 101 mmol/L (ref 98–110)
CO2: 25 mmol/L (ref 20–31)
CREATININE: 0.96 mg/dL (ref 0.50–1.10)
Glucose, Bld: 94 mg/dL (ref 65–99)
Potassium: 4.3 mmol/L (ref 3.5–5.3)
Sodium: 135 mmol/L (ref 135–146)

## 2016-05-15 NOTE — Progress Notes (Signed)
Patient ID: Christina Mora, female   DOB: 11/29/1971, 45 y.o.   MRN: [id number] GYNECOLOGY  VISIT   HPI: 45 y.o.   Married  [place] American  female   [phone] with Patient's last menstrual period was 05/10/2016 (exact date).   here for 1 week follow up. Patient states she is feeling much better with pelvic pain but still having blurred vision.   Patient's female partner is present with her today.   Feeling a whole lot better after almost completing a two week course of Flagyl and Doxycycline following Ceftriaxone IM. Last abx dosing is tomorrow. Feels pain free for one week now.  Did not start the Micronor following discontinuation of combined oral contraceptives.  Took combined oral contraceptives for about 2.5 weeks and stopped after visit on 05/09/16 due to visual changes. (This was not continuous as she skipped a couple of pills.) Opthalmologist has performed comprehensive evaluation and thinks that patient has premature presbyopia.  Has retinopathy of prematurity. No retinal detachment or retinal arterial or venous thrombosis diagnosed. He is recommending checking blood sugar.   Hx of anovulation.  GYNECOLOGIC HISTORY: Patient's last menstrual period was 05/10/2016 (exact date). Contraception: Female partner/Micronor(hasn't started yet) Menopausal hormone therapy:  None Last mammogram:  12-12-15 3D--see Epic Last pap smear: 01-26-16 Neg:Neg HR HPV          OB History    Gravida Para Term Preterm AB TAB SAB Ectopic Multiple Living   2 1 1  1  1   1          Patient Active Problem List   Diagnosis Date Noted  . Obesity, unspecified 05/17/2014  . Allergic rhinitis, cause unspecified 05/17/2014    Past Medical History  Diagnosis Date  . Anovulatory     10+ years  . Fibroids   . Allergy     Loratadine PRN    Past Surgical History  Procedure Laterality Date  . Eye surgery      as a child  . Fracture surgery      R femur fracture age 25.  . Dilation and curettage of  uterus      Current Outpatient Prescriptions  Medication Sig Dispense Refill  . Ascorbic Acid (VITAMIN C PO) Take by mouth.    . desloratadine (CLARINEX) 5 MG tablet Take 1 tablet (5 mg total) by mouth daily. 90 tablet 3  . doxycycline (VIBRAMYCIN) 100 MG capsule Take 1 capsule (100 mg total) by mouth 2 (two) times daily. Take BID for 14 days.  Take with food as can cause GI distress. 28 capsule 0  . ibuprofen (ADVIL,MOTRIN) 200 MG tablet Take 200 mg by mouth every 6 (six) hours as needed.    Marland Kitchen LORazepam (ATIVAN) 0.5 MG tablet Take 1 tablet (0.5 mg total) by mouth 2 (two) times daily as needed for anxiety. 30 tablet 1  . meloxicam (MOBIC) 7.5 MG tablet Take 7.5 mg by mouth daily.    . metroNIDAZOLE (FLAGYL) 500 MG tablet Take 1 tablet (500 mg total) by mouth 2 (two) times daily. 28 tablet 0  . naproxen sodium (ANAPROX DS) 550 MG tablet 1 tab po q 12 hours prn pain 30 tablet 2  . norethindrone (MICRONOR,CAMILA,ERRIN) 0.35 MG tablet Take 1 tablet (0.35 mg total) by mouth daily. (Patient not taking: Reported on 05/15/2016) 1 Package 2   No current facility-administered medications for this visit.     ALLERGIES: Robaxin  Family History  Problem Relation Age of Onset  . Stroke Mother   .  Heart disease Mother     AMI x few with defibrillator/CHF  . COPD Mother   . Epilepsy Mother   . Obesity Mother   . Heart attack Mother   . Multiple sclerosis Maternal Grandmother   . Cancer Paternal Grandmother   . Breast cancer Paternal Aunt   . Breast cancer Paternal Aunt   . Breast cancer Paternal Aunt     Social History   Social History  . Marital Status: Married    Spouse Name: N/A  . Number of Children: N/A  . Years of Education: N/A   Occupational History  . BUSINESS OWNER     daycare centers x 2   Social History Main Topics  . Smoking status: Never Smoker   . Smokeless tobacco: Never Used  . Alcohol Use: No  . Drug Use: No  . Sexual Activity:    Partners: Female     Comment:  Micronor/female partner   Other Topics Concern  . 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    ROS:  Pertinent items are noted in HPI.  PHYSICAL EXAMINATION:    BP 118/70 mmHg  Pulse 80  Ht 5' 7.5" (1.715 m)  Wt 215 lb 9.6 oz (97.796 kg)  BMI 33.25 kg/m2  LMP 05/10/2016 (Exact Date)    General appearance: alert, cooperative and appears stated age.  ASSESSMENT  Pelvic pain essentially gone following 2 week course of Doxycycline, Flagyl, and IM Rocephin. Uterine fibroids.  Leukocytosis has been persistent. Visual changes.   PLAN  Will check CBC with diff, BMP, and hgbA1C today.  Discussion of atypical picture of potential infection in the pelvis.  No further abx at this time.  No contraceptive pills.  Discussion of fibroids and rare sarcoma formation in 0.1 - 1% of patients. Discussed barrier methods with sexual activity.  Patient hoping to avoid hysterectomy.  Follow up prn.    An After Visit Summary was printed and given to the patient.  ___25___ minutes face to face time of which over 50% was spent in counseling.

## 2016-05-16 ENCOUNTER — Encounter: Payer: Self-pay | Admitting: Obstetrics and Gynecology

## 2016-05-16 LAB — HEMOGLOBIN A1C
Hgb A1c MFr Bld: 5.9 % — ABNORMAL HIGH (ref <5.7)
Mean Plasma Glucose: 123 mg/dL

## 2016-05-20 ENCOUNTER — Telehealth: Payer: Self-pay | Admitting: Emergency Medicine

## 2016-05-20 NOTE — Telephone Encounter (Signed)
Call to patient and she is given message from Dr. Quincy Simmonds.  Discussed results and recommendations from Dr. Quincy Simmonds, patient agreeable to follow up with her PCP, Dr. Tamala Julian.   Patient requests that a copy of her lab work be sent to her. She requests that labs be sent to [facility]. [place], [place] [id number]. Patient advises that this is not her address on file, but wishes to have her copies sent there. Advised can take her verbal request and sent to front office for processing, but they may want to call her to confirm address, patient agreeable.  Patient requests that copies of lab results be mailed to her home address. Patient name, DOB and home address confirmed verbally with patient and verbal release of records completed and mailed to address as above.   Routing to provider for final review. Patient agreeable to disposition. Will close encounter.    cc Leeroy Bock for records processing.

## 2016-05-20 NOTE — Telephone Encounter (Signed)
-----   Message from Nunzio Cobbs, MD sent at 05/16/2016  5:09 PM EDT ----- Please inform patient of her lab work.  Her WBC is still elevated and is at the same level of 13,000. She is mildly anemic with a hemoglobin of 11.2.  A regular multivitamin with iron should raise this. Her hemoglobin A1C is elevated in a prediabetic range.  Her level is 5.9 and prediabetes is 5.7 - 6.4.  A low carbohydrate and low sugar diet, increased exercise and weight loss can reduce this level and reduce risk of diabetes. Her blood chemistries are other wise normal.   I would like to have her see her PCP regarding the elevated WBC and elevated blood sugar for further care.  Cc- Dr. Talbert Nan, Marisa Sprinkles

## 2016-05-28 ENCOUNTER — Telehealth: Payer: Self-pay | Admitting: Obstetrics and Gynecology

## 2016-05-28 NOTE — Telephone Encounter (Signed)
Routing to Lamont Snowball, RN for surgery scheduling.

## 2016-05-28 NOTE — Telephone Encounter (Signed)
Patient would like to proceed with surgery and thinks she needs additional appointments prior to scheduling surgery.

## 2016-05-29 NOTE — Telephone Encounter (Signed)
Return call to patient. States she continues to have intermittent pain and it has "worn her down" to the point that she is ready to proceed with surgery.  Consult appointment scheduled for 06-12-16. 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 Advised patient we should move consult up to have more scheduling flexibility.  Appointment rescheduled to Friday 05-31-16 at [facility] to provider for final review. Patient agreeable to disposition. Will close encounter.

## 2016-05-31 ENCOUNTER — Ambulatory Visit (INDEPENDENT_AMBULATORY_CARE_PROVIDER_SITE_OTHER): Payer: BLUE CROSS/BLUE SHIELD | Admitting: Obstetrics and Gynecology

## 2016-05-31 ENCOUNTER — Encounter: Payer: Self-pay | Admitting: Obstetrics and Gynecology

## 2016-05-31 VITALS — BP 118/60 | HR 90 | Temp 99.1°F | Ht 67.5 in | Wt 216.0 lb

## 2016-05-31 DIAGNOSIS — M25559 Pain in unspecified hip: Secondary | ICD-10-CM | POA: Diagnosis not present

## 2016-05-31 DIAGNOSIS — D259 Leiomyoma of uterus, unspecified: Secondary | ICD-10-CM | POA: Diagnosis not present

## 2016-05-31 DIAGNOSIS — R509 Fever, unspecified: Secondary | ICD-10-CM

## 2016-05-31 LAB — CBC WITH DIFFERENTIAL/PLATELET
BASOS PCT: 0 %
Basophils Absolute: 0 cells/uL (ref 0–200)
EOS ABS: 129 {cells}/uL (ref 15–500)
Eosinophils Relative: 1 %
HCT: 35.5 % (ref 35.0–45.0)
Hemoglobin: 11.4 g/dL — ABNORMAL LOW (ref 11.7–15.5)
Lymphocytes Relative: 23 %
Lymphs Abs: 2967 cells/uL (ref 850–3900)
MCH: 26.4 pg — ABNORMAL LOW (ref 27.0–33.0)
MCHC: 32.1 g/dL (ref 32.0–36.0)
MCV: 82.2 fL (ref 80.0–100.0)
MONO ABS: 774 {cells}/uL (ref 200–950)
MONOS PCT: 6 %
MPV: 9.1 fL (ref 7.5–12.5)
NEUTROS ABS: 9030 {cells}/uL — AB (ref 1500–7800)
Neutrophils Relative %: 70 %
PLATELETS: 413 10*3/uL — AB (ref 140–400)
RBC: 4.32 MIL/uL (ref 3.80–5.10)
RDW: 14.4 % (ref 11.0–15.0)
WBC: 12.9 10*3/uL — AB (ref 3.8–10.8)

## 2016-05-31 MED ORDER — CEFTRIAXONE SODIUM 1 G IJ SOLR
250.0000 mg | Freq: Once | INTRAMUSCULAR | Status: AC
  Administered 2016-05-31: 250 mg via INTRAMUSCULAR

## 2016-05-31 NOTE — Progress Notes (Signed)
After consult with Dr Quincy Simmonds, discussed possible dates for surgery and patient desires to proceed with June 24, 2016. Surgery instruction sheet reviewed with patient and printed copy given, see copy scanned to chart.

## 2016-05-31 NOTE — Progress Notes (Signed)
Patient ID: Christina Mora, female   DOB: [birth year], 45 y.o.   MRN: [id number] GYNECOLOGY  VISIT   HPI: 45 y.o.   Married  [place] American  female   [phone] with Patient's last menstrual period was 05/10/2016 (exact date).   here for consultation regarding surgery.  Patient states she does feel her pain has returned.  Pain has returned and increasing.  Does not have the greenish discharge.  Female condom was difficult and using gloves were not satisfactory.  Using finger cots. Still has the pain and it recurred after sex again.   Hurts to lie on her abdomen.   Feels warn down and not well.  Ready to proceed with hysterectomy, and she is requesting me to be her primary surgeon. Asking if she can have Rocephin again today and asking for direction on antibiotic therapy although she states she has been on them continuously for months.  GYNECOLOGIC HISTORY: Patient's last menstrual period was 05/10/2016 (exact date). Contraception:  Female partner Menopausal hormone therapy: n/a Last mammogram:12-12-15 3D--see Epic Last pap smear: 01-26-16 Neg:Neg HR HPV           OB History    Gravida Para Term Preterm AB TAB SAB Ectopic Multiple Living   2 1 1  1  1   1          Patient Active Problem List   Diagnosis Date Noted  . Obesity, unspecified 05/17/2014  . Allergic rhinitis, cause unspecified 05/17/2014    Past Medical History  Diagnosis Date  . Anovulatory     10+ years  . Fibroids   . Allergy     Loratadine PRN  . Elevated WBC count 2017  . Elevated hemoglobin A1c 2017    Past Surgical History  Procedure Laterality Date  . Eye surgery      as a child  . Fracture surgery      R femur fracture age 54.  . Dilation and curettage of uterus      Current Outpatient Prescriptions  Medication Sig Dispense Refill  . Ascorbic Acid (VITAMIN C PO) Take by mouth.    . desloratadine (CLARINEX) 5 MG tablet Take 1 tablet (5 mg total) by mouth daily. 90 tablet 3  . ibuprofen  (ADVIL,MOTRIN) 200 MG tablet Take 200 mg by mouth every 6 (six) hours as needed.    Marland Kitchen LORazepam (ATIVAN) 0.5 MG tablet Take 1 tablet (0.5 mg total) by mouth 2 (two) times daily as needed for anxiety. (Patient not taking: Reported on 05/31/2016) 30 tablet 1   No current facility-administered medications for this visit.     ALLERGIES: Robaxin  Family History  Problem Relation Age of Onset  . Stroke Mother   . Heart disease Mother     AMI x few with defibrillator/CHF  . COPD Mother   . Epilepsy Mother   . Obesity Mother   . Heart attack Mother   . Multiple sclerosis Maternal Grandmother   . Cancer Paternal Grandmother   . Breast cancer Paternal Aunt   . Breast cancer Paternal Aunt   . Breast cancer Paternal Aunt     Social History   Social History  . Marital Status: Married    Spouse Name: N/A  . Number of Children: N/A  . Years of Education: N/A   Occupational History  . BUSINESS OWNER     daycare centers x 2   Social History Main Topics  . Smoking status: Never Smoker   . Smokeless tobacco: Never  Used  . Alcohol Use: No  . Drug Use: No  . Sexual Activity:    Partners: Female     Comment: Micronor/female partner   Other Topics Concern  . 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    ROS:  Pertinent items are noted in HPI.  PHYSICAL EXAMINATION:    BP 118/60 mmHg  Pulse 90  Temp(Src) 99.1 F (37.3 C) (Oral)  Ht 5' 7.5" (1.715 m)  Wt 216 lb (97.977 kg)  BMI 33.31 kg/m2  LMP 05/10/2016 (Exact Date)    General appearance: alert,  cooperative and appears stated age   Abdomen:  soft, mildly tender across lower abdomen with no guarding or rebound.  Uterine enlargement appreciated.    ASSESSMENT  Recurrent pelvic pain.  Uterine fibroids.  Persistently elevated WBC.  Low grade temp. The most likely source of the patient's pain and fever is her uterus, so I agree that hysterectomy at this point is very reasonable.  PLAN  Discussed total abdominal hysterectomy with bilateral salpingectomy, possible bilateral oophorectomy but only if ovaries are abnormal.  Risks, benefits, and alternatives discussed with the patient who wishes to proceed.  We discussed an abdominal approach in order to avoid morcellation of fibroids in this atypical presentation/situation.   We discussed rare possibility of sarcoma.  Risks include but are not limited to bleeding, infection, damage to surrounding organs, reaction to anesthesia, pneumonia, DVT, PE, death, hernia formation, neuropathy, need for reoperation. OK for Rocephin today.  I told patient that this is a very empiric treatment. Will check CBC.  I pain increases, OK to start on course of Doxycycline 100 mg po bid and Flagyl 500 mg po bid or Ciprofloxacin 500 mg po bid and Flagyl 500 mg po bid. Return for formal preop appointment.     An After Visit Summary was printed and given to the patient.  __40____ minutes face to face time of which over 50% was spent in counseling.

## 2016-06-01 ENCOUNTER — Encounter: Payer: Self-pay | Admitting: Obstetrics and Gynecology

## 2016-06-03 ENCOUNTER — Telehealth: Payer: Self-pay | Admitting: *Deleted

## 2016-06-03 MED ORDER — METRONIDAZOLE 500 MG PO TABS: 500.0000 mg | ORAL_TABLET | Freq: Two times a day (BID) | ORAL | Status: DC

## 2016-06-03 MED ORDER — CIPROFLOXACIN HCL 500 MG PO TABS: 500.0000 mg | ORAL_TABLET | Freq: Two times a day (BID) | ORAL | Status: DC

## 2016-06-03 NOTE — Telephone Encounter (Signed)
I am recommending Ciprofloxacin 500 mg po bid and Flagyl 500 mg po bid for one week each.  Please send to pharmacy of choice.  The plan was to treat with abx if her pain increased.  She had a low grade fever when I saw her for her last visit.  Thanks.

## 2016-06-03 NOTE — Telephone Encounter (Signed)
-----   Message from Nunzio Cobbs, MD sent at 06/01/2016  9:04 AM EDT ----- Please report results to patient.  Her WBC is still slightly elevated.  It is a little lower than it has been.  Let me know if she is not feeling any better following the Rocephin. She has mild anemia as well.  A multivitamin with iron may improve her energy level.   Cc- Lamont Snowball, Marisa Sprinkles

## 2016-06-03 NOTE — Telephone Encounter (Signed)
Call to patient. Initailly states she is "dealing with some pain this morning but knows her surgery date is coming." Advised calling to notify of WBC result remains slightly elevated with slight improvement from previous reading. Also calling to see how she is feeling since injection of Rocephin. Patient states she is actually feeling worse today than on Friday. Usually functions with a constant pain level of 2-3 /10 and considers this "background pain."  States last couple of days pain level is 5-6/10 and is more enough to interfere with ADL and ability to go to work. Took Meloxicam and only provided slight relief.  Advised patient recommend office visit today with Dr Quincy Simmonds which patient immediately declines and states she "just can't." States this is the same cyclic pain that she has had for six months but she is in one of the higher pain cycles. States she is confident this is not a new acute process and doesn't feel she needs to be seen. Denies feeling febrile, doesn't actually have thermometer. Advised again of recommendation for office visit which patient continues to decline. She is aware Dr Quincy Simmonds will review call and we will call her back if additional recommendations.   Patient notified of hgb result and slight anemia. Dr Quincy Simmonds recommends multivitamin with iron for the anemia and help with her energy level.  Patient states this result is normal for her and MVI with iron cause constipation so she just increases iron in her diet.  Advised will call her back if additonal instructions after Dr Quincy Simmonds reviews call.

## 2016-06-03 NOTE — Telephone Encounter (Signed)
Call to patient. Patient states she is slightly better this afternoon. Advised of instructions from Dr Quincy Simmonds for Cipro and Flagyl BID x 1 week. Alcohol precautions given while on Flagyl and three days following.    Patient agreeable to plan.

## 2016-06-06 ENCOUNTER — Telehealth: Payer: Self-pay | Admitting: Obstetrics and Gynecology

## 2016-06-06 NOTE — Telephone Encounter (Signed)
Spoke with pt regarding benefit for surgery scheduled on 06/24/16. Patient understood. Patient to call back to provide surgery deposit. over the phone. Patient aware this is professional benefit only. Patient aware will be contacted by hospital for separate benefits.

## 2016-06-12 ENCOUNTER — Ambulatory Visit: Payer: BC Managed Care – PPO | Admitting: Obstetrics and Gynecology

## 2016-06-12 NOTE — Patient Instructions (Addendum)
Your procedure is scheduled on: Monday, June 26  Enter through the [employer] [facility] at: [employer] up the phone at the desk and dial [phone].  Call this number if you have problems the morning of surgery: [phone].  Remember: Do NOT eat or drink after midnight Sunday, 6/25  Take these medicines the morning of surgery with a SIP OF WATER:  clarinex and ativan if needed  Do NOT wear jewelry (body piercing), metal hair clips/bobby pins, make-up, or nail polish. Do NOT wear lotions, powders, or perfumes.  You may wear deoderant. Do NOT shave for 48 hours prior to surgery. Do NOT bring valuables to the hospital.  Leave suitcase in car.  After surgery it may be brought to your room.  For patients admitted to the hospital, checkout time is 11:00 AM the day of discharge. Home with partner Christina Mora cell [phone] or daughter Christina Mora [phone].

## 2016-06-13 ENCOUNTER — Encounter (HOSPITAL_COMMUNITY): Payer: Self-pay

## 2016-06-13 ENCOUNTER — Encounter: Payer: Self-pay | Admitting: Obstetrics and Gynecology

## 2016-06-13 ENCOUNTER — Ambulatory Visit: Payer: BLUE CROSS/BLUE SHIELD | Admitting: Obstetrics and Gynecology

## 2016-06-13 ENCOUNTER — Encounter (HOSPITAL_COMMUNITY)
Admission: RE | Admit: 2016-06-13 | Discharge: 2016-06-13 | Disposition: A | Discharge status: Home / Self Care | Payer: BC Managed Care – PPO | Source: Ambulatory Visit | Attending: Obstetrics and Gynecology | Admitting: Obstetrics and Gynecology

## 2016-06-13 ENCOUNTER — Telehealth: Payer: Self-pay | Admitting: Obstetrics and Gynecology

## 2016-06-13 DIAGNOSIS — Z01812 Encounter for preprocedural laboratory examination: Secondary | ICD-10-CM | POA: Insufficient documentation

## 2016-06-13 HISTORY — DX: Anxiety disorder, unspecified: F41.9

## 2016-06-13 HISTORY — DX: Anemia, unspecified: D64.9

## 2016-06-13 LAB — ABO/RH: ABO/RH(D): O NEG

## 2016-06-13 LAB — CBC WITH DIFFERENTIAL/PLATELET
BASOS ABS: 0 10*3/uL (ref 0.0–0.1)
BASOS PCT: 0 %
EOS PCT: 1 %
Eosinophils Absolute: 0.1 10*3/uL (ref 0.0–0.7)
HCT: 36.1 % (ref 36.0–46.0)
Hemoglobin: 11.8 g/dL — ABNORMAL LOW (ref 12.0–15.0)
Lymphocytes Relative: 26 %
Lymphs Abs: 2.5 10*3/uL (ref 0.7–4.0)
MCH: 26.7 pg (ref 26.0–34.0)
MCHC: 32.7 g/dL (ref 30.0–36.0)
MCV: 81.7 fL (ref 78.0–100.0)
MONO ABS: 0.3 10*3/uL (ref 0.1–1.0)
Monocytes Relative: 3 %
Neutro Abs: 6.5 10*3/uL (ref 1.7–7.7)
Neutrophils Relative %: 70 %
PLATELETS: 420 10*3/uL — AB (ref 150–400)
RBC: 4.42 MIL/uL (ref 3.87–5.11)
RDW: 14 % (ref 11.5–15.5)
WBC: 9.3 10*3/uL (ref 4.0–10.5)

## 2016-06-13 LAB — COMPREHENSIVE METABOLIC PANEL
ALBUMIN: 4.5 g/dL (ref 3.5–5.0)
ALT: 15 U/L (ref 14–54)
AST: 19 U/L (ref 15–41)
Alkaline Phosphatase: 76 U/L (ref 38–126)
Anion gap: 7 (ref 5–15)
BUN: 13 mg/dL (ref 6–20)
CHLORIDE: 104 mmol/L (ref 101–111)
CO2: 24 mmol/L (ref 22–32)
Calcium: 9 mg/dL (ref 8.9–10.3)
Creatinine, Ser: 0.94 mg/dL (ref 0.44–1.00)
GFR calc Af Amer: >60 mL/min (ref >60)
GLUCOSE: 115 mg/dL — AB (ref 65–99)
POTASSIUM: 3.6 mmol/L (ref 3.5–5.1)
Sodium: 135 mmol/L (ref 135–145)
Total Bilirubin: 0.5 mg/dL (ref 0.3–1.2)
Total Protein: 7.8 g/dL (ref 6.5–8.1)

## 2016-06-13 LAB — TYPE AND SCREEN
ABO/RH(D): O NEG
Antibody Screen: NEGATIVE

## 2016-06-13 NOTE — Telephone Encounter (Signed)
Patient cancelled her appointment today. See staff message

## 2016-06-13 NOTE — Telephone Encounter (Signed)
Thank you for the update.  Encounter closed.

## 2016-06-14 ENCOUNTER — Telehealth: Payer: Self-pay | Admitting: Obstetrics and Gynecology

## 2016-06-14 NOTE — Telephone Encounter (Signed)
Patient is asking to talk with Gay Filler about her surgery.. Patient says it is regarding the conversation she had earlier today with Gay Filler. No open phone note.

## 2016-06-17 NOTE — Telephone Encounter (Signed)
Call to patient, left message to call back.

## 2016-06-17 NOTE — Telephone Encounter (Signed)
-----   Message from Nunzio Cobbs, MD sent at 06/13/2016  9:04 PM EDT ----- Please inform patient of her labs from the hospital. Her WBC is normal for the first time in several months! She still has slight anemia, but8 even this is improved at a level of 11.8. Platelets are elevated at 420,000, which is expected with a low hemoglobin.  This is stable for her.  Glucose 115.   Cc- Christina Mora

## 2016-06-18 ENCOUNTER — Ambulatory Visit: Payer: BC Managed Care – PPO | Admitting: Obstetrics and Gynecology

## 2016-06-18 NOTE — Telephone Encounter (Signed)
Patient returned call. Surgery was cancelled due to insurance issue that she could not resolve prior to surgery. Advised of lab results as directed by DR Quincy Simmonds. Advised to call back when ready to reschedule surgery.  Encounter closed.

## 2016-06-19 ENCOUNTER — Telehealth: Payer: Self-pay | Admitting: Obstetrics and Gynecology

## 2016-06-19 ENCOUNTER — Ambulatory Visit: Payer: BC Managed Care – PPO | Admitting: Obstetrics and Gynecology

## 2016-06-19 NOTE — Telephone Encounter (Signed)
Patient called to let Gay Filler know her insurance was re activated. She would like to schedule her surgery now.

## 2016-06-19 NOTE — Telephone Encounter (Signed)
Routing call to business office for insurance update;

## 2016-06-20 NOTE — Telephone Encounter (Signed)
Spoke with patient and advised upon re-verifying benefits with her insurance, [facility] records are still reflecting a termination date of 05/29/16. Patient advised. Patient states she will call back to discuss scheduling options, once insurance issues have been resolved.  Routing to [place] for reveiw

## 2016-06-20 NOTE — Telephone Encounter (Signed)
Routing to provider for final review. Patient agreeable to disposition. Will close encounter.

## 2016-06-24 ENCOUNTER — Inpatient Hospital Stay (HOSPITAL_COMMUNITY)
Admission: RE | Admit: 2016-06-24 | Payer: BC Managed Care – PPO | Source: Ambulatory Visit | Admitting: Obstetrics and Gynecology

## 2016-06-24 ENCOUNTER — Encounter (HOSPITAL_COMMUNITY): Admission: RE | Payer: Self-pay | Source: Ambulatory Visit

## 2016-06-24 SURGERY — HYSTERECTOMY, TOTAL, ABDOMINAL, WITH SALPINGECTOMY
Anesthesia: General

## 2016-06-27 ENCOUNTER — Telehealth: Payer: Self-pay | Admitting: Obstetrics and Gynecology

## 2016-06-27 NOTE — Telephone Encounter (Signed)
Return call to patient. Desires to schedule surgery in September. Date options discussed. Patient requests 09-24-16. Surgery will be rescheduled for 09-24-16 at 0730 at [facility] surgery instruction sheet, patient states she recalls this information and still has these instructions. Advised will mail new instruction sheet with updated appointments.

## 2016-06-27 NOTE — Telephone Encounter (Signed)
Surgery confirmed for 09-24-16 at 0730.  Routing to provider for final review. Patient agreeable to disposition. Will close encounter.

## 2016-06-27 NOTE — Telephone Encounter (Signed)
Spoke with patient. Patient's insurance concerns have been resolved. Patient is ready to proceed with scheduling surgery.  Patient has provided surgery deposit.   Routing to [employer] for scheduling

## 2016-07-01 ENCOUNTER — Ambulatory Visit: Payer: BLUE CROSS/BLUE SHIELD | Admitting: Obstetrics and Gynecology

## 2016-07-31 ENCOUNTER — Ambulatory Visit: Payer: BLUE CROSS/BLUE SHIELD | Admitting: Obstetrics and Gynecology

## 2016-09-05 ENCOUNTER — Encounter: Payer: Self-pay | Admitting: Obstetrics and Gynecology

## 2016-09-05 ENCOUNTER — Ambulatory Visit (INDEPENDENT_AMBULATORY_CARE_PROVIDER_SITE_OTHER): Payer: BC Managed Care – PPO | Admitting: Obstetrics and Gynecology

## 2016-09-05 VITALS — BP 124/70 | HR 84 | Resp 18 | Ht 67.0 in | Wt 210.0 lb

## 2016-09-05 DIAGNOSIS — D259 Leiomyoma of uterus, unspecified: Secondary | ICD-10-CM

## 2016-09-05 DIAGNOSIS — M25559 Pain in unspecified hip: Secondary | ICD-10-CM

## 2016-09-05 NOTE — Progress Notes (Signed)
GYNECOLOGY  VISIT   HPI: 45 y.o.   Married  [place] American  female   [phone] with No LMP recorded.   here for  Consult for surgery.  Does want hysterectomy and feels at peace with her choice.  Want to resolved the cyclic monthly pain.  Wants to leave her cervix and ovaries if possible.  Does not want to morcellate the uterus.   Has recurrent pelvic pain and uterine fibroids.  Prior ultrasound showing multiple fibroids.  EMB benign with no evidence of endometritis.  Has had several courses of empiric abx due to her pain, low grade fevers, and elevated WBC. STD testing has been negative.   Still having pain and cramping after sex.  No more vaginal discharge and drainage.  No more fevers.   Can have pain even if not having intercourse.  Feels like an icy hot burning and pain.   Menses are every 21 - 24 days.  Bleeding is normal and they are painful for a couple of days.   Pain medication of choice is Naproxen or Advil or Percocet. Had a brief trial of combined oral contraceptives but stopped due to visual changes.  Declines future childbearing.   No change in medical status.   GYNECOLOGIC HISTORY: No LMP recorded. LMP: 08/23/16 Contraception: none Menopausal hormone therapy:  n/a Last mammogram:  12/12/15 3D--see EPIC Last pap smear:   01/26/16 Neg; Neg HR HPV        OB History    Gravida Para Term Preterm AB Living   2 1 1   1 1    SAB TAB Ectopic Multiple Live Births   1       1         Patient Active Problem List   Diagnosis Date Noted  . Obesity, unspecified 05/17/2014  . Allergic rhinitis, cause unspecified 05/17/2014    Past Medical History:  Diagnosis Date  . Allergy    Loratadine PRN  . Anemia   . Anovulatory    10+ years  . Anxiety   . Elevated hemoglobin A1c 2017  . Elevated WBC count 2017  . Fibroids   . SVD (spontaneous vaginal delivery)    x 1    Past Surgical History:  Procedure Laterality Date  . DILATION AND CURETTAGE OF UTERUS     x 4, 3 for heavy bleeding, 1 for MAB  . EYE SURGERY Bilateral    as a child at age 75 or 4  . FRACTURE SURGERY     R femur fracture age 79.    Current Outpatient Prescriptions  Medication Sig Dispense Refill  . Ascorbic Acid (VITAMIN C PO) Take 1 tablet by mouth daily.     . ciprofloxacin (CIPRO) 500 MG tablet Take 1 tablet (500 mg total) by mouth 2 (two) times daily. 14 tablet 0  . desloratadine (CLARINEX) 5 MG tablet Take 1 tablet (5 mg total) by mouth daily. 90 tablet 3  . ibuprofen (ADVIL,MOTRIN) 200 MG tablet Take 200 mg by mouth every 6 (six) hours as needed.    Marland Kitchen LORazepam (ATIVAN) 0.5 MG tablet Take 1 tablet (0.5 mg total) by mouth 2 (two) times daily as needed for anxiety. (Patient not taking: Reported on 05/31/2016) 30 tablet 1  . metroNIDAZOLE (FLAGYL) 500 MG tablet Take 1 tablet (500 mg total) by mouth 2 (two) times daily. 14 tablet 0  . naproxen sodium (ANAPROX) 550 MG tablet Take 1 tablet by mouth every 12 (twelve) hours as needed for moderate  pain.   2   No current facility-administered medications for this visit.      ALLERGIES: Robaxin [methocarbamol]  Family History  Problem Relation Age of Onset  . Stroke Mother   . Heart disease Mother     AMI x few with defibrillator/CHF  . COPD Mother   . Epilepsy Mother   . Obesity Mother   . Heart attack Mother   . Multiple sclerosis Maternal Grandmother   . Cancer Paternal Grandmother   . Breast cancer Paternal Aunt   . Breast cancer Paternal Aunt   . Breast cancer Paternal Aunt     Social History   Social History  . Marital status: Married    Spouse name: N/A  . Number of children: N/A  . Years of education: N/A   Occupational History  . BUSINESS OWNER     daycare centers x 2   Social History Main Topics  . Smoking status: Never Smoker  . Smokeless tobacco: Never Used  . Alcohol use 0.0 oz/week     Comment: occasional wine   . Drug use: No  . Sexual activity: Yes    Partners: Female    Birth control/  protection: None     Comment: female partner   Other Topics Concern  . 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    ROS:  Pertinent items are noted in HPI.  PHYSICAL EXAMINATION:    There were no vitals taken for this visit.    General appearance: alert, cooperative and appears stated age Head: Normocephalic, without obvious abnormality, atraumatic Neck: no adenopathy, supple, symmetrical, trachea midline and thyroid normal to inspection and palpation Lungs: clear to auscultation bilaterally Heart: regular rate and rhythm Abdomen: soft, non-tender, no masses,  no organomegaly Extremities: extremities normal, atraumatic, no cyanosis or edema Skin: Skin color, texture, turgor normal. No rashes or lesions  No abnormal inguinal nodes palpated Neurologic: Grossly normal  Pelvic: External genitalia:  no lesions              Urethra:  normal appearing urethra with no masses, tenderness or lesions              Bartholins and Skenes: normal                 Vagina: normal appearing vagina with normal color and discharge, no lesions              Cervix: no lesions                Bimanual Exam:  Uterus:   15 week size, mobile and slightly tender.              Adnexa: no mass, fullness, tenderness             Chaperone was present for exam.  ASSESSMENT  Pelvic  pain.  Uterine fibroids.  PLAN  Proceed with supracervical hysterectomy and bilateral salpingectomy.  Wants to maintain ovaries as much as possible but understands that one or both may need removal  if they are diseased.  Risks, benefits, and alternatives have been reviewed with the patient who wishes to proceed.  She states she understands what the goals of surgery are, the risks involved, and her expected recovery. She also understands that surgery is not a guarantee that she will not have recurrent pelvic pain, although it is expected to treat this.  If she leaves her cervix, she may have recurrent vaginal discharge, cyclic bleeding, risk of future dysplasia/cervical cancer, and need for further operation.    An After Visit Summary was printed and given to the patient.  __25____ minutes face to face time of which over 50% was spent in counseling.

## 2016-09-10 ENCOUNTER — Telehealth: Payer: Self-pay | Admitting: Obstetrics and Gynecology

## 2016-09-10 NOTE — Telephone Encounter (Signed)
Left message requesting patient to call in reference to scheduled surgical procedure.

## 2016-09-11 NOTE — Patient Instructions (Signed)
Your procedure is scheduled on:  Tuesday, Sept, 26, 2017  Enter through the [employer] [facility] at:  6:00 AM  Pick up the phone at the desk and dial [phone].  Call this number if you have problems the morning of surgery: [phone].  Remember: Do NOT eat food or drink after:  Midnight Monday, Sept. 25, 2017  Take these medicines the morning of surgery with a SIP OF WATER:  None  Stop taking Vitamin C at this time  Do NOT wear jewelry (body piercing), metal hair clips/bobby pins, make-up, or nail polish. Do NOT wear lotions, powders, or perfumes.  You may wear deodorant. Do NOT shave for 48 hours prior to surgery. Do NOT bring valuables to the hospital. Contacts, dentures, or bridgework may not be worn into surgery.  Leave suitcase in car.  After surgery it may be brought to your room.  For patients admitted to the hospital, checkout time is 11:00 AM the day of discharge.

## 2016-09-12 ENCOUNTER — Encounter (HOSPITAL_COMMUNITY)
Admission: RE | Admit: 2016-09-12 | Discharge: 2016-09-12 | Disposition: A | Discharge status: Home / Self Care | Payer: BC Managed Care – PPO | Source: Ambulatory Visit | Attending: Obstetrics and Gynecology | Admitting: Obstetrics and Gynecology

## 2016-09-12 ENCOUNTER — Encounter (HOSPITAL_COMMUNITY): Payer: Self-pay

## 2016-09-12 DIAGNOSIS — D259 Leiomyoma of uterus, unspecified: Secondary | ICD-10-CM | POA: Diagnosis not present

## 2016-09-12 DIAGNOSIS — Z01812 Encounter for preprocedural laboratory examination: Secondary | ICD-10-CM | POA: Diagnosis not present

## 2016-09-12 DIAGNOSIS — R102 Pelvic and perineal pain: Secondary | ICD-10-CM | POA: Insufficient documentation

## 2016-09-12 HISTORY — DX: Prediabetes: R73.03

## 2016-09-12 LAB — CBC
HCT: 34.9 % — ABNORMAL LOW (ref 36.0–46.0)
Hemoglobin: 11.6 g/dL — ABNORMAL LOW (ref 12.0–15.0)
MCH: 26.8 pg (ref 26.0–34.0)
MCHC: 33.2 g/dL (ref 30.0–36.0)
MCV: 80.6 fL (ref 78.0–100.0)
Platelets: 394 K/uL (ref 150–400)
RBC: 4.33 MIL/uL (ref 3.87–5.11)
RDW: 14.3 % (ref 11.5–15.5)
WBC: 11.5 K/uL — ABNORMAL HIGH (ref 4.0–10.5)

## 2016-09-12 LAB — COMPREHENSIVE METABOLIC PANEL
ALBUMIN: 4.3 g/dL (ref 3.5–5.0)
ALK PHOS: 61 U/L (ref 38–126)
ALT: 15 U/L (ref 14–54)
AST: 18 U/L (ref 15–41)
Anion gap: 5 (ref 5–15)
BUN: 12 mg/dL (ref 6–20)
CALCIUM: 9.2 mg/dL (ref 8.9–10.3)
CHLORIDE: 104 mmol/L (ref 101–111)
CO2: 27 mmol/L (ref 22–32)
CREATININE: 0.88 mg/dL (ref 0.44–1.00)
GFR calc Af Amer: >60 mL/min (ref >60)
GFR calc non Af Amer: >60 mL/min (ref >60)
GLUCOSE: 98 mg/dL (ref 65–99)
Potassium: 4.2 mmol/L (ref 3.5–5.1)
SODIUM: 136 mmol/L (ref 135–145)
Total Bilirubin: 0.5 mg/dL (ref 0.3–1.2)
Total Protein: 7.5 g/dL (ref 6.5–8.1)

## 2016-09-12 NOTE — Pre-Procedure Instructions (Signed)
Left a message for Gay Filler at Dr. Elza Rafter office in reference to Ms. Gaby's elevated white blood cell count.

## 2016-09-12 NOTE — Telephone Encounter (Signed)
Left message requesting a return call in reference to scheduled surgical procedure. Upon return call, if Christina Mora is not available, please refer call to Thayer Ohm

## 2016-09-13 NOTE — Telephone Encounter (Signed)
Patient had her pre op appointment yesterday and has some questions.about her labs. Patient also thisnks she may have an infection. Patient says Cipro works best.

## 2016-09-13 NOTE — Telephone Encounter (Signed)
Spoke with patient. Patient reports she had pre-op appointment 09/12/16 and has concerns about recent labs, particularly her WBC. Patient reports no signs of infection. Reviewed patients labs and recommendations as requested by Dr. Quincy Simmonds, as seen below. Patient is agreeable.  Status:  Final result Visible to patient:  No (Not Released) Next appt:  09/30/2016 at 12:45 PM in Gynecology Arloa Koh, MD)  Notes Recorded by Nunzio Cobbs, MD on 09/12/2016 at 10:34 PM EDT Please inform patient of her preop lab results. Her blood chemistries are normal.  Her white blood cell count is elevated slightly at a level of 11.5. It was as high as 13.6 five months ago.   Prophylactic antibiotics will be given intravenously in the preop hold area just prior to beginning surgery. No antibiotics at this time. I am not sure what we would be treating.

## 2016-09-13 NOTE — Telephone Encounter (Signed)
Thank you for passing my message along to the patient.  Her elevated WBC is chronic and has not responded to abx therapy in the past.  We are not certain the cause.  We will proceed with routine surgical prophylaxis in the hospital.   Thank you.   You may close the encounter.

## 2016-09-23 ENCOUNTER — Telehealth: Payer: Self-pay | Admitting: Obstetrics and Gynecology

## 2016-09-23 MED ORDER — CIPROFLOXACIN IN D5W 400 MG/200ML IV SOLN
400.0000 mg | INTRAVENOUS | Status: AC
  Administered 2016-09-24: 400 mg via INTRAVENOUS
  Filled 2016-09-23: qty 200

## 2016-09-23 MED ORDER — METRONIDAZOLE IN NACL 5-0.79 MG/ML-% IV SOLN
500.0000 mg | INTRAVENOUS | Status: AC
  Administered 2016-09-24: 500 mg via INTRAVENOUS
  Filled 2016-09-23: qty 100

## 2016-09-23 NOTE — Anesthesia Preprocedure Evaluation (Signed)
Anesthesia Evaluation  Patient identified by MRN, date of birth, ID band Patient awake    Reviewed: Allergy & Precautions, NPO status , Patient's Chart, lab work & pertinent test results  Airway Mallampati: II  TM Distance: >3 FB Neck ROM: Full    Dental no notable dental hx.    Pulmonary neg pulmonary ROS,    Pulmonary exam normal breath sounds clear to auscultation       Cardiovascular negative cardio ROS Normal cardiovascular exam Rhythm:Regular Rate:Normal     Neuro/Psych PSYCHIATRIC DISORDERS Anxiety negative neurological ROS     GI/Hepatic negative GI ROS, Neg liver ROS,   Endo/Other  negative endocrine ROS  Renal/GU negative Renal ROS     Musculoskeletal negative musculoskeletal ROS (+)   Abdominal (+) + obese,   Peds  Hematology  (+) anemia ,   Anesthesia Other Findings   Reproductive/Obstetrics negative OB ROS                             Anesthesia Physical Anesthesia Plan  ASA: II  Anesthesia Plan: General   Post-op Pain Management:    Induction: Intravenous  Airway Management Planned: Oral ETT  Additional Equipment:   Intra-op Plan:   Post-operative Plan: Extubation in OR  Informed Consent: I have reviewed the patients History and Physical, chart, labs and discussed the procedure including the risks, benefits and alternatives for the proposed anesthesia with the patient or authorized representative who has indicated his/her understanding and acceptance.   Dental advisory given  Plan Discussed with: CRNA  Anesthesia Plan Comments:        Anesthesia Quick Evaluation

## 2016-09-23 NOTE — H&P (Signed)
Office Visit   09/05/2016 [place], MD  Obstetrics and Gynecology   Uterine leiomyoma, unspecified location +1 more  Dx   consult for sugery ; Referred by Wardell Honour, MD  Reason for Visit   Additional Documentation   Vitals:   BP 124/70 (BP Location: Right Arm, Patient Position: Sitting, Cuff Size: Normal)   Pulse 84   Resp 18   Ht 5' 7"  (1.702 m)   Wt 210 lb (95.3 kg)   LMP 08/23/2016   BMI 32.89 kg/m   BSA 2.12 m   Flowsheets:   Infectious Disease Screening,   Custom Formula Data,   MEWS Score,   Anthropometrics     Encounter Info:   Billing Info,   History,   Allergies,   Detailed Report     All Notes   Progress Notes by Nunzio Cobbs, MD at 09/05/2016 1:00 PM   Author: Nunzio Cobbs, MD Author Type: Physician Filed: 09/07/2016 7:41 PM  Note Status: Signed Cosign: Cosign Not Required Encounter Date: 09/05/2016  Editor: Nunzio Cobbs, MD (Physician)  Prior Versions: 1. Archie Balboa, CMA (Certified Medical Assistant) at 09/05/2016 1:30 PM - Sign at close encounter    GYNECOLOGY  VISIT   HPI: 45 y.o.   Married  [place] American  female   [phone] with No LMP recorded.   here for  Consult for surgery.  Does want hysterectomy and feels at peace with her choice.  Want to resolved the cyclic monthly pain.  Wants to leave her cervix and ovaries if possible.  Does not want to morcellate the uterus.   Has recurrent pelvic pain and uterine fibroids.  Prior ultrasound showing multiple fibroids.  EMB benign with no evidence of endometritis.  Has had several courses of empiric abx due to her pain, low grade fevers, and elevated WBC. STD testing has been negative.   Still having pain and cramping after sex.  No more vaginal discharge and drainage.  No more fevers.   Can have pain even if not having intercourse.  Feels like an icy hot burning and pain.   Menses are every 21 - 24  days.  Bleeding is normal and they are painful for a couple of days.   Pain medication of choice is Naproxen or Advil or Percocet. Had a brief trial of combined oral contraceptives but stopped due to visual changes.  Declines future childbearing.   No change in medical status.   GYNECOLOGIC HISTORY: No LMP recorded. LMP: 08/23/16 Contraception: none Menopausal hormone therapy:  n/a Last mammogram:  12/12/15 3D--see EPIC Last pap smear:   01/26/16 Neg; Neg HR HPV                OB History    Gravida Para Term Preterm AB Living   2 1 1   1 1    SAB TAB Ectopic Multiple Live Births   1       1             Patient Active Problem List   Diagnosis Date Noted  . Obesity, unspecified 05/17/2014  . Allergic rhinitis, cause unspecified 05/17/2014        Past Medical History:  Diagnosis Date  . Allergy    Loratadine PRN  . Anemia   . Anovulatory    10+ years  . Anxiety   . Elevated hemoglobin A1c 2017  . Elevated  WBC count 2017  . Fibroids   . SVD (spontaneous vaginal delivery)    x 1         Past Surgical History:  Procedure Laterality Date  . DILATION AND CURETTAGE OF UTERUS     x 4, 3 for heavy bleeding, 1 for MAB  . EYE SURGERY Bilateral    as a child at age 8 or 4  . FRACTURE SURGERY     R femur fracture age 43.          Current Outpatient Prescriptions  Medication Sig Dispense Refill  . Ascorbic Acid (VITAMIN C PO) Take 1 tablet by mouth daily.     . ciprofloxacin (CIPRO) 500 MG tablet Take 1 tablet (500 mg total) by mouth 2 (two) times daily. 14 tablet 0  . desloratadine (CLARINEX) 5 MG tablet Take 1 tablet (5 mg total) by mouth daily. 90 tablet 3  . ibuprofen (ADVIL,MOTRIN) 200 MG tablet Take 200 mg by mouth every 6 (six) hours as needed.    Marland Kitchen LORazepam (ATIVAN) 0.5 MG tablet Take 1 tablet (0.5 mg total) by mouth 2 (two) times daily as needed for anxiety. (Patient not taking: Reported on 05/31/2016) 30 tablet 1  .  metroNIDAZOLE (FLAGYL) 500 MG tablet Take 1 tablet (500 mg total) by mouth 2 (two) times daily. 14 tablet 0  . naproxen sodium (ANAPROX) 550 MG tablet Take 1 tablet by mouth every 12 (twelve) hours as needed for moderate pain.   2   No current facility-administered medications for this visit.      ALLERGIES: Robaxin [methocarbamol]        Family History  Problem Relation Age of Onset  . Stroke Mother   . Heart disease Mother     AMI x few with defibrillator/CHF  . COPD Mother   . Epilepsy Mother   . Obesity Mother   . Heart attack Mother   . Multiple sclerosis Maternal Grandmother   . Cancer Paternal Grandmother   . Breast cancer Paternal Aunt   . Breast cancer Paternal Aunt   . Breast cancer Paternal Aunt     Social History        Social History  . Marital status: Married    Spouse name: N/A  . Number of children: N/A  . Years of education: N/A        Occupational History  . BUSINESS OWNER     daycare centers x 2         Social History Main Topics  . Smoking status: Never Smoker  . Smokeless tobacco: Never Used  . Alcohol use 0.0 oz/week     Comment: occasional wine   . Drug use: No  . Sexual activity: Yes    Partners: Female    Birth control/ protection: None     Comment: female partner       Other Topics Concern  . 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    ROS:  Pertinent items are noted in HPI.  PHYSICAL EXAMINATION:    There were no vitals taken for this visit.     General appearance: alert, cooperative and appears stated age Head: Normocephalic, without obvious abnormality, atraumatic Neck: no adenopathy, supple, symmetrical, trachea midline and thyroid normal to inspection and palpation Lungs: clear to auscultation bilaterally Heart: regular rate and rhythm Abdomen: soft, non-tender, no masses,  no organomegaly Extremities: extremities normal, atraumatic, no cyanosis or edema Skin: Skin color, texture, turgor normal. No rashes or lesions  No abnormal inguinal nodes palpated Neurologic: Grossly normal  Pelvic: External genitalia:  no lesions              Urethra:  normal appearing urethra with no masses, tenderness or lesions              Bartholins and Skenes: normal                 Vagina: normal appearing vagina with normal color and discharge, no lesions              Cervix: no lesions                Bimanual Exam:  Uterus:   15 week size, mobile and slightly tender.              Adnexa: no mass, fullness, tenderness             Chaperone was present for exam.  ASSESSMENT  Pelvic pain.  Uterine fibroids.  PLAN  Proceed with supracervical hysterectomy and bilateral salpingectomy.  Wants to maintain ovaries as much as possible but understands that one or both may need removal if they are diseased.  Risks, benefits, and alternatives have been reviewed with the patient who wishes to proceed.  She states she understands what the goals of surgery are, the risks involved, and her expected recovery. She also understands that surgery is not a guarantee that she will not have recurrent pelvic pain, although it is expected to treat this.  If she leaves her cervix, she may have recurrent vaginal discharge, cyclic bleeding, risk of future dysplasia/cervical cancer, and need for further operation.    An After Visit Summary was printed and given to the patient.  __25____ minutes face to face time of which over 50% was spent in counseling.

## 2016-09-23 NOTE — Telephone Encounter (Signed)
Spoke with patient. Patient states she is scheduled for Abdominal Hysterectomy 09/24/16. Patient states she initially discussed leaving the cervix, she now desires for the cervix to be taken out. Patient would like to discuss this prior to surgery. Patient also would like to remind Dr. Quincy Simmonds of her desire to see her uterus after surgery. Patient states that this is important to her. Advised Dr. Quincy Simmonds out of the office this morning, will be in for afternoon. Advised will review with Dr. Quincy Simmonds and return call. Patient is agreeable.    Dr. Quincy Simmonds, please advise?

## 2016-09-23 NOTE — Telephone Encounter (Signed)
Patient would like to speak with Dr Quincy Simmonds about her surgery tomorrow.

## 2016-09-23 NOTE — Telephone Encounter (Signed)
Spoke with patient, advised as seen below per Dr. Quincy Simmonds. Patient is agreeable, no further questions.   Routing to provider for final review. Patient is agreeable to disposition. Will close encounter.

## 2016-09-23 NOTE — Telephone Encounter (Signed)
I understand we will remove her cervix.  She and I will confirm this tomorrow.  I will have the OR staff take pictures of the specimen tomorrow!

## 2016-09-24 ENCOUNTER — Encounter (HOSPITAL_COMMUNITY)
Admission: RE | Disposition: A | Discharge status: Home / Self Care | Payer: Self-pay | Source: Ambulatory Visit | Attending: Obstetrics and Gynecology

## 2016-09-24 ENCOUNTER — Inpatient Hospital Stay (HOSPITAL_COMMUNITY): Payer: BC Managed Care – PPO | Admitting: Anesthesiology

## 2016-09-24 ENCOUNTER — Inpatient Hospital Stay (HOSPITAL_COMMUNITY)
Admission: RE | Admit: 2016-09-24 | Discharge: 2016-09-26 | DRG: 743 | Disposition: A | Discharge status: Home / Self Care | Payer: BC Managed Care – PPO | Source: Ambulatory Visit | Attending: Obstetrics and Gynecology | Admitting: Obstetrics and Gynecology

## 2016-09-24 ENCOUNTER — Encounter (HOSPITAL_COMMUNITY): Payer: Self-pay | Admitting: Anesthesiology

## 2016-09-24 DIAGNOSIS — D649 Anemia, unspecified: Secondary | ICD-10-CM | POA: Diagnosis present

## 2016-09-24 DIAGNOSIS — D250 Submucous leiomyoma of uterus: Principal | ICD-10-CM | POA: Diagnosis present

## 2016-09-24 DIAGNOSIS — R102 Pelvic and perineal pain: Secondary | ICD-10-CM | POA: Diagnosis not present

## 2016-09-24 DIAGNOSIS — D251 Intramural leiomyoma of uterus: Secondary | ICD-10-CM | POA: Diagnosis present

## 2016-09-24 DIAGNOSIS — D72829 Elevated white blood cell count, unspecified: Secondary | ICD-10-CM | POA: Diagnosis present

## 2016-09-24 DIAGNOSIS — D252 Subserosal leiomyoma of uterus: Secondary | ICD-10-CM | POA: Diagnosis present

## 2016-09-24 DIAGNOSIS — Z90710 Acquired absence of both cervix and uterus: Secondary | ICD-10-CM | POA: Diagnosis present

## 2016-09-24 DIAGNOSIS — D259 Leiomyoma of uterus, unspecified: Secondary | ICD-10-CM | POA: Diagnosis not present

## 2016-09-24 HISTORY — PX: ABDOMINAL HYSTERECTOMY: SHX81

## 2016-09-24 HISTORY — PX: HYSTERECTOMY ABDOMINAL WITH SALPINGECTOMY: SHX6725

## 2016-09-24 LAB — HCG, SERUM, QUALITATIVE: PREG SERUM: NEGATIVE

## 2016-09-24 SURGERY — HYSTERECTOMY, TOTAL, ABDOMINAL, WITH SALPINGECTOMY
Anesthesia: General

## 2016-09-24 MED ORDER — PROPOFOL 10 MG/ML IV BOLUS
INTRAVENOUS | Status: AC
  Filled 2016-09-24: qty 20

## 2016-09-24 MED ORDER — DIPHENHYDRAMINE HCL 12.5 MG/5ML PO ELIX: 12.5000 mg | ORAL_SOLUTION | Freq: Four times a day (QID) | ORAL | Status: DC | PRN

## 2016-09-24 MED ORDER — NALOXONE HCL 0.4 MG/ML IJ SOLN: 0.4000 mg | INTRAMUSCULAR | Status: DC | PRN

## 2016-09-24 MED ORDER — OXYCODONE HCL 5 MG PO TABS: 5.0000 mg | ORAL_TABLET | Freq: Once | ORAL | Status: DC | PRN

## 2016-09-24 MED ORDER — SODIUM CHLORIDE 0.9 % IJ SOLN
INTRAMUSCULAR | Status: DC | PRN
  Administered 2016-09-24: 50 mL

## 2016-09-24 MED ORDER — MEPERIDINE HCL 25 MG/ML IJ SOLN: 6.2500 mg | INTRAMUSCULAR | Status: DC | PRN

## 2016-09-24 MED ORDER — KETOROLAC TROMETHAMINE 30 MG/ML IJ SOLN
INTRAMUSCULAR | Status: AC
  Filled 2016-09-24: qty 1

## 2016-09-24 MED ORDER — HYDROMORPHONE HCL 1 MG/ML IJ SOLN
INTRAMUSCULAR | Status: DC | PRN
  Administered 2016-09-24 (×2): 0.5 mg via INTRAVENOUS

## 2016-09-24 MED ORDER — ONDANSETRON HCL 4 MG/2ML IJ SOLN
INTRAMUSCULAR | Status: AC
  Filled 2016-09-24: qty 2

## 2016-09-24 MED ORDER — HYDROMORPHONE HCL 1 MG/ML IJ SOLN
INTRAMUSCULAR | Status: AC
  Filled 2016-09-24: qty 1

## 2016-09-24 MED ORDER — LORAZEPAM 1 MG PO TABS
1.0000 mg | ORAL_TABLET | Freq: Three times a day (TID) | ORAL | Status: DC
  Administered 2016-09-24 – 2016-09-25 (×4): 1 mg via ORAL
  Filled 2016-09-24 (×6): qty 1

## 2016-09-24 MED ORDER — ROCURONIUM BROMIDE 100 MG/10ML IV SOLN
INTRAVENOUS | Status: AC
  Filled 2016-09-24: qty 1

## 2016-09-24 MED ORDER — NALOXONE HCL 0.4 MG/ML IJ SOLN
0.4000 mg | INTRAMUSCULAR | Status: DC | PRN
Start: 2016-09-24 — End: 2016-09-24

## 2016-09-24 MED ORDER — ONDANSETRON HCL 4 MG/2ML IJ SOLN
INTRAMUSCULAR | Status: DC | PRN
  Administered 2016-09-24: 4 mg via INTRAVENOUS

## 2016-09-24 MED ORDER — DIPHENHYDRAMINE HCL 50 MG/ML IJ SOLN: 12.5000 mg | Freq: Four times a day (QID) | INTRAMUSCULAR | Status: DC | PRN

## 2016-09-24 MED ORDER — KETOROLAC TROMETHAMINE 30 MG/ML IJ SOLN
INTRAMUSCULAR | Status: DC | PRN
  Administered 2016-09-24: 30 mg via INTRAVENOUS

## 2016-09-24 MED ORDER — SODIUM CHLORIDE 0.9% FLUSH: 9.0000 mL | INTRAVENOUS | Status: DC | PRN

## 2016-09-24 MED ORDER — LIDOCAINE HCL (CARDIAC) 20 MG/ML IV SOLN
INTRAVENOUS | Status: DC | PRN
  Administered 2016-09-24: 100 mg via INTRAVENOUS

## 2016-09-24 MED ORDER — LACTATED RINGERS IV SOLN
INTRAVENOUS | Status: DC
  Administered 2016-09-24 – 2016-09-25 (×3): via INTRAVENOUS

## 2016-09-24 MED ORDER — KETOROLAC TROMETHAMINE 30 MG/ML IJ SOLN
30.0000 mg | Freq: Four times a day (QID) | INTRAMUSCULAR | Status: AC
  Administered 2016-09-25 (×3): 30 mg via INTRAVENOUS
  Filled 2016-09-24 (×3): qty 1

## 2016-09-24 MED ORDER — MORPHINE SULFATE 2 MG/ML IV SOLN
INTRAVENOUS | Status: DC
  Administered 2016-09-24: 12:00:00 via INTRAVENOUS
  Filled 2016-09-24: qty 25

## 2016-09-24 MED ORDER — PROMETHAZINE HCL 25 MG/ML IJ SOLN
6.2500 mg | INTRAMUSCULAR | Status: DC | PRN
  Administered 2016-09-24: 6.25 mg via INTRAVENOUS

## 2016-09-24 MED ORDER — HYDROMORPHONE 1 MG/ML IV SOLN
INTRAVENOUS | Status: DC
Start: 2016-09-24 — End: 2016-09-25
  Administered 2016-09-24: 5.2 mg via INTRAVENOUS
  Administered 2016-09-24: 16:00:00 via INTRAVENOUS
  Administered 2016-09-25: 2 mg via INTRAVENOUS
  Administered 2016-09-25: 2.4 mg via INTRAVENOUS
  Administered 2016-09-25: 1.2 mg via INTRAVENOUS
  Administered 2016-09-25: 0.6 mg via INTRAVENOUS

## 2016-09-24 MED ORDER — KETOROLAC TROMETHAMINE 30 MG/ML IJ SOLN: 30.0000 mg | Freq: Once | INTRAMUSCULAR | Status: DC | PRN

## 2016-09-24 MED ORDER — BUPIVACAINE LIPOSOME 1.3 % IJ SUSP
20.0000 mL | Freq: Once | INTRAMUSCULAR | Status: AC
  Administered 2016-09-24: 20 mL
  Filled 2016-09-24: qty 20

## 2016-09-24 MED ORDER — OXYCODONE-ACETAMINOPHEN 5-325 MG PO TABS: 1.0000 | ORAL_TABLET | ORAL | Status: DC | PRN

## 2016-09-24 MED ORDER — DEXAMETHASONE SODIUM PHOSPHATE 10 MG/ML IJ SOLN
INTRAMUSCULAR | Status: AC
  Filled 2016-09-24: qty 1

## 2016-09-24 MED ORDER — ONDANSETRON HCL 4 MG/2ML IJ SOLN: 4.0000 mg | Freq: Four times a day (QID) | INTRAMUSCULAR | Status: DC | PRN

## 2016-09-24 MED ORDER — ONDANSETRON HCL 4 MG/2ML IJ SOLN
4.0000 mg | Freq: Four times a day (QID) | INTRAMUSCULAR | Status: DC | PRN
  Administered 2016-09-24: 4 mg via INTRAVENOUS
  Filled 2016-09-24: qty 2

## 2016-09-24 MED ORDER — SUGAMMADEX SODIUM 200 MG/2ML IV SOLN
INTRAVENOUS | Status: DC | PRN
  Administered 2016-09-24: 200 mg via INTRAVENOUS

## 2016-09-24 MED ORDER — SODIUM CHLORIDE 0.9 % IJ SOLN
INTRAMUSCULAR | Status: AC
  Filled 2016-09-24: qty 50

## 2016-09-24 MED ORDER — FENTANYL CITRATE (PF) 250 MCG/5ML IJ SOLN
INTRAMUSCULAR | Status: AC
  Filled 2016-09-24: qty 5

## 2016-09-24 MED ORDER — OXYCODONE HCL 5 MG/5ML PO SOLN: 5.0000 mg | Freq: Once | ORAL | Status: DC | PRN

## 2016-09-24 MED ORDER — DIPHENHYDRAMINE HCL 12.5 MG/5ML PO ELIX
12.5000 mg | ORAL_SOLUTION | Freq: Four times a day (QID) | ORAL | Status: DC | PRN
Start: 2016-09-24 — End: 2016-09-24

## 2016-09-24 MED ORDER — MIDAZOLAM HCL 2 MG/2ML IJ SOLN
INTRAMUSCULAR | Status: AC
  Filled 2016-09-24: qty 2

## 2016-09-24 MED ORDER — PROPOFOL 10 MG/ML IV BOLUS
INTRAVENOUS | Status: DC | PRN
Start: 2016-09-24 — End: 2016-09-24
  Administered 2016-09-24: 200 mg via INTRAVENOUS

## 2016-09-24 MED ORDER — DEXAMETHASONE SODIUM PHOSPHATE 10 MG/ML IJ SOLN
INTRAMUSCULAR | Status: DC | PRN
  Administered 2016-09-24: 10 mg via INTRAVENOUS

## 2016-09-24 MED ORDER — SUGAMMADEX SODIUM 200 MG/2ML IV SOLN
INTRAVENOUS | Status: AC
  Filled 2016-09-24: qty 2

## 2016-09-24 MED ORDER — SCOPOLAMINE 1 MG/3DAYS TD PT72
1.0000 | MEDICATED_PATCH | Freq: Once | TRANSDERMAL | Status: DC
  Administered 2016-09-24: 1.5 mg via TRANSDERMAL

## 2016-09-24 MED ORDER — MIDAZOLAM HCL 5 MG/5ML IJ SOLN
INTRAMUSCULAR | Status: DC | PRN
  Administered 2016-09-24: 2 mg via INTRAVENOUS

## 2016-09-24 MED ORDER — ROCURONIUM BROMIDE 100 MG/10ML IV SOLN
INTRAVENOUS | Status: DC | PRN
  Administered 2016-09-24: 10 mg via INTRAVENOUS
  Administered 2016-09-24: 50 mg via INTRAVENOUS

## 2016-09-24 MED ORDER — FENTANYL CITRATE (PF) 100 MCG/2ML IJ SOLN
INTRAMUSCULAR | Status: DC | PRN
  Administered 2016-09-24: 150 ug via INTRAVENOUS
  Administered 2016-09-24: 100 ug via INTRAVENOUS
  Administered 2016-09-24: 50 ug via INTRAVENOUS
  Administered 2016-09-24 (×2): 100 ug via INTRAVENOUS

## 2016-09-24 MED ORDER — LORATADINE 10 MG PO TABS
10.0000 mg | ORAL_TABLET | Freq: Every day | ORAL | Status: DC
Start: 2016-09-24 — End: 2016-09-26
  Administered 2016-09-25 – 2016-09-26 (×2): 10 mg via ORAL
  Filled 2016-09-24 (×4): qty 1

## 2016-09-24 MED ORDER — MORPHINE SULFATE 2 MG/ML IV SOLN: INTRAVENOUS | Status: DC

## 2016-09-24 MED ORDER — BUPIVACAINE HCL (PF) 0.25 % IJ SOLN
INTRAMUSCULAR | Status: AC
  Filled 2016-09-24: qty 30

## 2016-09-24 MED ORDER — LACTATED RINGERS IV SOLN
INTRAVENOUS | Status: DC
  Administered 2016-09-24 (×3): via INTRAVENOUS

## 2016-09-24 MED ORDER — LACTATED RINGERS IV SOLN: INTRAVENOUS | Status: DC

## 2016-09-24 MED ORDER — ONDANSETRON HCL 4 MG PO TABS: 4.0000 mg | ORAL_TABLET | Freq: Four times a day (QID) | ORAL | Status: DC | PRN

## 2016-09-24 MED ORDER — HYDROMORPHONE HCL 1 MG/ML IJ SOLN
0.2500 mg | INTRAMUSCULAR | Status: DC | PRN
  Administered 2016-09-24 (×4): 0.5 mg via INTRAVENOUS

## 2016-09-24 MED ORDER — SCOPOLAMINE 1 MG/3DAYS TD PT72
MEDICATED_PATCH | TRANSDERMAL | Status: AC
  Administered 2016-09-24: 1.5 mg via TRANSDERMAL
  Filled 2016-09-24: qty 1

## 2016-09-24 MED ORDER — LIDOCAINE HCL (CARDIAC) 20 MG/ML IV SOLN
INTRAVENOUS | Status: AC
  Filled 2016-09-24: qty 5

## 2016-09-24 MED ORDER — MENTHOL 3 MG MT LOZG
1.0000 | LOZENGE | OROMUCOSAL | Status: DC | PRN
  Filled 2016-09-24: qty 9

## 2016-09-24 MED ORDER — HYDROMORPHONE 1 MG/ML IV SOLN
INTRAVENOUS | Status: DC
  Administered 2016-09-24: 13:00:00 via INTRAVENOUS
  Filled 2016-09-24: qty 25

## 2016-09-24 MED ORDER — IBUPROFEN 600 MG PO TABS
600.0000 mg | ORAL_TABLET | Freq: Four times a day (QID) | ORAL | Status: DC | PRN
  Administered 2016-09-25 – 2016-09-26 (×3): 600 mg via ORAL
  Filled 2016-09-24 (×4): qty 1

## 2016-09-24 MED ORDER — PROMETHAZINE HCL 25 MG/ML IJ SOLN
INTRAMUSCULAR | Status: AC
  Filled 2016-09-24: qty 1

## 2016-09-24 SURGICAL SUPPLY — 45 items
CANISTER SUCT 3000ML (MISCELLANEOUS) ×3 IMPLANT
CATH FOLEY 3WAY  5CC 16FR (CATHETERS)
CATH FOLEY 3WAY 5CC 16FR (CATHETERS) IMPLANT
CELLS DAT CNTRL 66122 CELL SVR (MISCELLANEOUS) IMPLANT
CLOTH BEACON ORANGE TIMEOUT ST (SAFETY) ×3 IMPLANT
DECANTER SPIKE VIAL GLASS SM (MISCELLANEOUS) ×3 IMPLANT
DRAPE WARM FLUID 44X44 (DRAPE) ×3 IMPLANT
DRSG OPSITE POSTOP 4X10 (GAUZE/BANDAGES/DRESSINGS) ×3 IMPLANT
DURAPREP 26ML APPLICATOR (WOUND CARE) ×3 IMPLANT
ELECT BLADE 6.5 EXT (BLADE) ×3 IMPLANT
GAUZE SPONGE 4X4 16PLY XRAY LF (GAUZE/BANDAGES/DRESSINGS) ×3 IMPLANT
GLOVE BIO SURGEON STRL SZ 6.5 (GLOVE) ×3 IMPLANT
GLOVE BIOGEL PI IND STRL 7.0 (GLOVE) ×6 IMPLANT
GLOVE BIOGEL PI INDICATOR 7.0 (GLOVE) ×3
GOWN STRL REUS W/TWL LRG LVL3 (GOWN DISPOSABLE) ×9 IMPLANT
HEMOSTAT ARISTA ABSORB 3G PWDR (MISCELLANEOUS) ×3 IMPLANT
LIQUID BAND (GAUZE/BANDAGES/DRESSINGS) ×3 IMPLANT
NEEDLE HYPO 22GX1.5 SAFETY (NEEDLE) ×3 IMPLANT
NS IRRIG 1000ML POUR BTL (IV SOLUTION) ×6 IMPLANT
PACK ABDOMINAL GYN (CUSTOM PROCEDURE TRAY) ×3 IMPLANT
PACK VAGINAL MINOR WOMEN LF (CUSTOM PROCEDURE TRAY) IMPLANT
PAD OB MATERNITY 4.3X12.25 (PERSONAL CARE ITEMS) ×3 IMPLANT
PROTECTOR NERVE ULNAR (MISCELLANEOUS) IMPLANT
RETRACTOR WND ALEXIS 25 LRG (MISCELLANEOUS) IMPLANT
RTRCTR C-SECT PINK 25CM LRG (MISCELLANEOUS) ×3 IMPLANT
RTRCTR WOUND ALEXIS 18CM MED (MISCELLANEOUS)
RTRCTR WOUND ALEXIS 25CM LRG (MISCELLANEOUS)
SET CYSTO W/LG BORE CLAMP LF (SET/KITS/TRAYS/PACK) ×3 IMPLANT
SHEET LAVH (DRAPES) ×3 IMPLANT
SPONGE LAP 18X18 X RAY DECT (DISPOSABLE) ×3 IMPLANT
STAPLER VISISTAT 35W (STAPLE) IMPLANT
SUT PLAIN 2 0 XLH (SUTURE) ×3 IMPLANT
SUT VIC AB 0 CT1 18XCR BRD8 (SUTURE) ×6 IMPLANT
SUT VIC AB 0 CT1 27 (SUTURE) ×3
SUT VIC AB 0 CT1 27XBRD ANBCTR (SUTURE) ×6 IMPLANT
SUT VIC AB 0 CT1 8-18 (SUTURE) ×3
SUT VIC AB 2-0 CT1 27 (SUTURE) ×1
SUT VIC AB 2-0 CT1 TAPERPNT 27 (SUTURE) ×2 IMPLANT
SUT VIC AB 4-0 PS2 27 (SUTURE) ×3 IMPLANT
SUT VICRYL 0 TIES 12 18 (SUTURE) ×3 IMPLANT
SYR CONTROL 10ML LL (SYRINGE) ×3 IMPLANT
TOWEL OR 17X24 6PK STRL BLUE (TOWEL DISPOSABLE) ×6 IMPLANT
TRAY FOLEY CATH SILVER 14FR (SET/KITS/TRAYS/PACK) ×3 IMPLANT
TRAY FOLEY CATH SILVER 16FR (SET/KITS/TRAYS/PACK) IMPLANT
WATER STERILE IRR 1000ML POUR (IV SOLUTION) ×3 IMPLANT

## 2016-09-24 NOTE — Anesthesia Postprocedure Evaluation (Signed)
Anesthesia Post Note  Patient: Christina Mora  Procedure(s) Performed: Procedure(s) (LRB): HYSTERECTOMY ABDOMINAL WITH Bilateral SALPINGECTOMY with Lysis of Adhesions (Bilateral)  Patient location during evaluation: [facility] Anesthesia Type: General Level of consciousness: awake and alert and oriented Pain management: pain level controlled Vital Signs Assessment: post-procedure vital signs reviewed and stable Respiratory status: spontaneous breathing, respiratory function stable, patient connected to nasal cannula oxygen and nonlabored ventilation Cardiovascular status: stable Postop Assessment: no signs of nausea or vomiting and adequate PO intake Anesthetic complications: no     Last Vitals:  Vitals:   09/24/16 1320 09/24/16 1539  BP: 119/74 122/69  Pulse: 81 87  Resp: 16 20  Temp:      Last Pain:  Vitals:   09/24/16 1156  TempSrc:   PainSc: 3    Pain Goal: Patients Stated Pain Goal: 4 (09/24/16 1115)               Willa Rough

## 2016-09-24 NOTE — Progress Notes (Signed)
Update to History and Physical  Has had a sore throat and sneezing.  No known fevers or a productive cough.   Patient is requesting removal of her cervix with her hysterectomy today.   Patient examined.   OK to proceed with surgery.

## 2016-09-24 NOTE — Transfer of Care (Signed)
Immediate Anesthesia Transfer of Care Note  Patient: Christina Mora  Procedure(s) Performed: Procedure(s) with comments: HYSTERECTOMY ABDOMINAL WITH Bilateral SALPINGECTOMY with Lysis of Adhesions (Bilateral) - 3 hours  Patient Location: PACU  Anesthesia Type:General  Level of Consciousness: sedated  Airway & Oxygen Therapy: Patient Spontanous Breathing and Patient connected to nasal cannula oxygen  Post-op Assessment: Report given to RN and Post -op Vital signs reviewed and stable  Post vital signs: Reviewed and stable  Last Vitals:  Vitals:   09/24/16 0647  BP: 110/76  Pulse: 85  Resp: 20  Temp: 36.6 C    Last Pain:  Vitals:   09/24/16 0647  TempSrc: Oral      Patients Stated Pain Goal: 4 (57/97/28 2060)  Complications: No apparent anesthesia complications

## 2016-09-24 NOTE — Op Note (Signed)
OPERATIVE REPORT   PREOPERATIVE DIAGNOSIS: Uterine fibroids, pelvic pain.   POSTOPERATIVE DIAGNOSIS:  Uterine fibroids, pelvic pain.   PROCEDURE: Total abdominal hysterectomy with bilateral salpingectomy, lysis of adhesions.    SURGEON: Lenard Galloway, MD  ASSISTANT:  Dorothy Spark, MD  ANESTHESIA: General endotracheal.  Exparel in subcutaneous layer.  IV FLUIDS: 2800 cc.  EBL:  100  URINE OUTPUT: 1425 cc.   COMPLICATIONS: None.  INDICATIONS FOR THE PROCEDURE:   The patient is a 45 year old African American female who presents with pelvic pain and known fibroids.  The patient declines medical therapy for her fibroids, and she has completed childbearing.   The plan is now made to proceed with a total abdominal hysterectomy with a bilateral salpingectomy, and possible bilateral oophorectomy and cystoscopy.  Risks, benefits, and alternatives have been reviewed with the patient who wishes to proceed.  FINDINGS:   At the time of laparotomy, the patient was noted to have a 13 - 14 week size fibroid uterus and normal tubes and ovaries. There was minimal adhesive disease in the pelvis along the sigmoid colon to the left sidewall and the right adnexal to the right sidewall. The rectum was drawn up slightly along the posterior cervix.  The appendix was unremarkable. The liver edge and gallbladder palpated to be normal. The kidneys were unremarkable as was the periaortic region.  SPECIMENS: The uterus, cervix, and bilateral tubes were sent to Pathology.  PROCEDURE IN DETAIL: The patient was reidentified in the preop hold area. She did receive Flagyl and Ciprofloxacin  IV for antibiotic prophylaxis. She received both TED hose and PAS stockings for DVT prophylaxis.  The patient was escorted to the operating room, where she was placed in the dorsal lithotomy position in [place]. General endotracheal anesthesia was induced. The patient's lower  abdomen, vagina, and perineum were sterilely prepped and she was sterilely draped. The Foley catheter was placed inside the bladder and left to gravity drainage throughout and at the termination of the procedure.  The procedure began by creating a Pfannenstiel incision with a scalpel. The incision was carried down to the subcutaneous tissues using monopolar cautery for hemostasis. The fascia was incised in the midline with a scalpel and the incision was extended bilaterally using the Mayo scissors. The rectus muscles were bluntly divided in the midline. The parietal peritoneum was elevated with 2 hemostat clamps and entered sharply with the Metzenbaum scissors. The peritoneal incision was extended cranially and caudally.  An Alexis retractor was then placed inside the peritoneal cavity and the patient was placed in the Trendelenburg position. The bowel was packed into the upper abdomen with moistened lap pads.  The procedure began by placing Kelly clamps across the adnexal structures bilaterally. The left round ligament was isolated and was suture ligated with a transfixing suture of 0 Vicryl. Monopolar cautery was used to bisect the round ligament and the anterior and posterior leaves were opened with monopolar cautery. The bladder flap was taken down anteriorly using a Metzenbaum scissors. The mesosalpinx of the  distal left fallopian tube with grasped with a Kelly clamp and cauterized with monopolar cautery.  This continued through the mesosalpinx until the proximal fallopian tube was reached and transected in order to free the tube from the uterus. A window was created through the posterior leaf of the broad ligament on this side and the utero-ovarian ligament was doubly clamped, sharply divided, and free tied with 0 Vicryl followed by suture ligature of the same. The  same procedure that was performed on the patient's left hand side was now repeated on the patient's  right hand side for isolation of the round ligament, dissection in the broad ligament, removal of the right fallopian tube, and isolation of the right utero-ovarian ligament.   The bladder flap was further taken down sharply with the Metzenbaum scissors. Each of the uterine arteries were skeletonized and they were then clamped, sharply divided, and suture ligated with 0 Vicryl suture. The rectum was sharply dissected off of the posterior cervix using a metzembaum scissors..  The cervix was amputated from the uterine fundus using a scalpel.  This was later sent to pathology with the remainder of the specimen.  This allowed for better visualization into the pelvis.  The bladder was further dissected off the cervix anteriorly and the inferior aspects of the cardinal ligaments were clamped with straight Heaney clamps, sharply divided, and suture ligated with 0 Vicryl bilaterally. Eventually, the uterosacral ligaments were reached. A clamp was placed across the right vaginal apex and the vagina was entered in this location. The pedicle was suture ligated with a transfixing suture of 0 Vicryl. The same was performed on the patient's left hand side. The Mayo scissors was used to sharply excise the cervix and the specimen from the vaginal apex. The entire specimen was sent to Pathology.  The vaginal cuff was closed at this time with figure-of-eight sutures of 0 Vicryl.  The pelvis was irrigated and suctioned and hemostasis was noted to be good.  The abdomen was closed at this time. The moistened lap pads and the Alexis retractor were removed from the peritoneal cavity. The parietal peritoneum was closed with a running suture of 2-0 Vicryl. The rectus muscles were examined and there was a small oozing vessel on the patient's right rectus muscle which responded to monopolar cautery. Hemostasis was then good. The fascia was closed with a running suture of 0 Vicryl. Exparel 20 cc  in 50 cc of normal saline was injected in the subcutaneous layer.  The subcutaneous layer was irrigated and suctioned and made hemostatic with monopolar cautery. Interrupted sutures of 2-0 plain were placed in the subcutaneous layer and the skin was closed with a subcuticular suture of 4-0 Vicryl.  Dermabond was placed over the incision.  A sterile honeycomb bandage was later placed.  At this point, the patient was awakened and extubated, and escorted to the recovery room in stable condition. There were no complications. All needle, instrument, and sponge counts were correct.     Lenard Galloway, M.D.

## 2016-09-24 NOTE — Progress Notes (Signed)
Day of Surgery Procedure(s) (LRB): HYSTERECTOMY ABDOMINAL WITH Bilateral SALPINGECTOMY with Lysis of Adhesions (Bilateral)  Subjective: Patient reports incisional pain.   States pain in her right side that was intolerable until she rolled over onto her left side.  Asking to increase her pain medication from low dose Dilaudid to something stronger.  Started with low dose morphine PCA and has progressed to a high dose morphine PCA to low dose Dilaudid PCA currently.  Also receiving Toradol IV.  Has ambulated in the halls.   Objective: I have reviewed patient's vital signs and intake and output. Vitals:   09/24/16 1539 09/24/16 1732  BP: 122/69 (!) 106/54  Pulse: 87 87  Resp: 20 12  Temp:  100 F (37.8 C)   I/O - 2800 cc/1525 cc.   General: cooperative and lying on her side with eye closed.  Talking and asking appropriae questions about surgery today. Resp: clear to auscultation bilaterally Cardio: regular rate and rhythm, S1, S2 normal, no murmur, click, rub or gallop GI: soft, non-tender; bowel sounds normal; no masses,  no organomegaly and Dressing clean, dry, intact.  Extremities: Ted hose on.  DPs 1+ bilaterally. Vaginal Bleeding: none  Assessment: s/p Procedure(s) with comments: HYSTERECTOMY ABDOMINAL WITH Bilateral SALPINGECTOMY with Lysis of Adhesions (Bilateral) - 3 hours: stable.  Plan: Continue foley due to post op state. Dilaudid high dose PCA.  Toradol 30 mg IV q 6 hours x 5 doses.  Clear liquid diet.  Foley overnight.  Discussed surgical findings and procedure.  CBC and BMP in the am.   LOS: 0 days    Arloa Koh 09/24/2016, 5:51 PM

## 2016-09-24 NOTE — Anesthesia Procedure Notes (Signed)
Procedure Name: Intubation Date/Time: 09/24/2016 7:29 AM Performed by: Riki Sheer Pre-anesthesia Checklist: Patient identified, Emergency Drugs available, Suction available, Patient being monitored and Timeout performed Patient Re-evaluated:Patient Re-evaluated prior to inductionOxygen Delivery Method: Circle system utilized Preoxygenation: Pre-oxygenation with 100% oxygen Intubation Type: IV induction Ventilation: Mask ventilation without difficulty Laryngoscope Size: Miller and 2 Grade View: Grade I Tube type: Oral Tube size: 7.0 mm Number of attempts: 1 Airway Equipment and Method: Stylet Placement Confirmation: ETT inserted through vocal cords under direct vision,  positive ETCO2,  CO2 detector and breath sounds checked- equal and bilateral Secured at: 22 cm Tube secured with: Tape Dental Injury: Teeth and Oropharynx as per pre-operative assessment

## 2016-09-24 NOTE — Addendum Note (Signed)
Addendum  created 09/24/16 1728 by Jonna Munro, CRNA   Sign clinical note

## 2016-09-24 NOTE — Anesthesia Postprocedure Evaluation (Signed)
Anesthesia Post Note  Patient: Christina Mora  Procedure(s) Performed: Procedure(s) (LRB): HYSTERECTOMY ABDOMINAL WITH Bilateral SALPINGECTOMY with Lysis of Adhesions (Bilateral)  Patient location during evaluation: PACU Anesthesia Type: General Level of consciousness: sedated and patient cooperative Pain management: pain level controlled Vital Signs Assessment: post-procedure vital signs reviewed and stable Respiratory status: spontaneous breathing Cardiovascular status: stable Anesthetic complications: no     Last Vitals:  Vitals:   09/24/16 1000 09/24/16 1015  BP: (!) 144/78 136/63  Pulse: (!) 105 92  Resp: 18 17  Temp:      Last Pain:  Vitals:   09/24/16 1015  TempSrc:   PainSc: 6    Pain Goal: Patients Stated Pain Goal: 4 (09/24/16 1015)               Nolon Nations

## 2016-09-24 NOTE — Brief Op Note (Signed)
09/24/2016  10:17 AM  PATIENT:  Christina Mora  45 y.o. female  PRE-OPERATIVE DIAGNOSIS:  Uterine fibroids, pelvic pain  POST-OPERATIVE DIAGNOSIS:   Uterine fibroids, pelvic pain, minor pelvic adhesions.  PROCEDURE:  Procedure(s) with comments: HYSTERECTOMY ABDOMINAL WITH Bilateral SALPINGECTOMY with Lysis of Adhesions (Bilateral) - 3 hours  SURGEON:  Surgeon(s) and Role:    * Ananiah Maciolek E Yisroel Ramming, MD - Primary    * Salvadore Dom, MD - Assisting  PHYSICIAN ASSISTANT: NA  ASSISTANTS:  Salvadore Dom, MD   ANESTHESIA:   general and Exparel.  EBL:  Total I/O In: 2500 [I.V.:2500] Out: 450 [Urine:350; Blood:100]  BLOOD ADMINISTERED:none  DRAINS: Urinary Catheter (Foley)   LOCAL MEDICATIONS USED:  OTHER - Exparel 20 cc in 50 cc NS.  SPECIMEN:  Source of Specimen:  Uterus, cervix, bilateral tubes.   DISPOSITION OF SPECIMEN:  PATHOLOGY  COUNTS:  YES  TOURNIQUET:  * No tourniquets in log *  DICTATION: .Note written in EPIC  PLAN OF CARE: Admit to inpatient   PATIENT DISPOSITION:  PACU - hemodynamically stable.   Delay start of Pharmacological VTE agent (>24hrs) due to surgical blood loss or risk of bleeding: not applicable

## 2016-09-24 NOTE — Brief Op Note (Signed)
09/24/2016  6:23 PM  PATIENT:  ANBER MCKIVER  45 y.o. female  PRE-OPERATIVE DIAGNOSIS:  Uterine fibroids, pelvic pain.  POST-OPERATIVE DIAGNOSIS:  Uterine fibroids, pelvic pain.  PROCEDURE:  Procedure(s) with comments: HYSTERECTOMY ABDOMINAL WITH Bilateral SALPINGECTOMY with Lysis of Adhesions (Bilateral) - 3 hours  SURGEON:  Surgeon(s) and Role:    * Brook E Yisroel Ramming, MD - Primary    * Salvadore Dom, MD - Assisting  PHYSICIAN ASSISTANT:  NA.  ASSISTANTS: Salvadore Dom, MD   ANESTHESIA:   local and general  EBL:  Total I/O In: 3112 [I.V.:2800] Out: 1624 [Urine:1425; Blood:100]  BLOOD ADMINISTERED:none  DRAINS: Urinary Catheter (Foley)   LOCAL MEDICATIONS USED:  OTHER  Exparel 20 cc in 50 cc NS.  SPECIMEN:  Source of Specimen:  Uterus, cervix, bilateral tubes.  DISPOSITION OF SPECIMEN:  PATHOLOGY  COUNTS:  YES  TOURNIQUET:  * No tourniquets in log *  DICTATION: .Note written in EPIC  PLAN OF CARE: Admit to inpatient   PATIENT DISPOSITION:  PACU - hemodynamically stable.   Delay start of Pharmacological VTE agent (>24hrs) due to surgical blood loss or risk of bleeding: not applicable

## 2016-09-25 ENCOUNTER — Encounter (HOSPITAL_COMMUNITY): Payer: Self-pay

## 2016-09-25 LAB — BASIC METABOLIC PANEL
Anion gap: 7 (ref 5–15)
BUN: 9 mg/dL (ref 6–20)
CHLORIDE: 103 mmol/L (ref 101–111)
CO2: 26 mmol/L (ref 22–32)
CREATININE: 0.8 mg/dL (ref 0.44–1.00)
Calcium: 8.8 mg/dL — ABNORMAL LOW (ref 8.9–10.3)
GFR calc non Af Amer: >60 mL/min (ref >60)
Glucose, Bld: 102 mg/dL — ABNORMAL HIGH (ref 65–99)
POTASSIUM: 4 mmol/L (ref 3.5–5.1)
Sodium: 136 mmol/L (ref 135–145)

## 2016-09-25 LAB — CBC
HEMATOCRIT: 31.2 % — AB (ref 36.0–46.0)
HEMOGLOBIN: 10.4 g/dL — AB (ref 12.0–15.0)
MCH: 27 pg (ref 26.0–34.0)
MCHC: 33.3 g/dL (ref 30.0–36.0)
MCV: 81 fL (ref 78.0–100.0)
Platelets: 404 10*3/uL — ABNORMAL HIGH (ref 150–400)
RBC: 3.85 MIL/uL — AB (ref 3.87–5.11)
RDW: 14.1 % (ref 11.5–15.5)
WBC: 17.4 10*3/uL — ABNORMAL HIGH (ref 4.0–10.5)

## 2016-09-25 MED ORDER — TEMAZEPAM 15 MG PO CAPS
15.0000 mg | ORAL_CAPSULE | Freq: Every evening | ORAL | Status: DC | PRN
  Administered 2016-09-25: 15 mg via ORAL
  Filled 2016-09-25: qty 1

## 2016-09-25 MED ORDER — HYDROMORPHONE HCL 2 MG PO TABS
2.0000 mg | ORAL_TABLET | ORAL | Status: DC | PRN
  Administered 2016-09-25 – 2016-09-26 (×8): 2 mg via ORAL
  Filled 2016-09-25 (×8): qty 1

## 2016-09-25 NOTE — Progress Notes (Signed)
1 Day Post-Op Procedure(s) (LRB): HYSTERECTOMY ABDOMINAL WITH Bilateral SALPINGECTOMY with Lysis of Adhesions (Bilateral) Late entry.  Rounds done at 8:00 am.  Subjective: Patient reports incisional pain.   Abdominal binder is helping more than the high dose Dilaudid PCA.  Burping.  Walked twice.  Foley is out.  No void yet.   Objective: I have reviewed patient's vital signs, intake and output and labs.  Vitals:   09/25/16 0947 09/25/16 1000  BP:  (!) 101/55  Pulse:  73  Resp: 19 18  Temp:  98.5 F (36.9 C)    I/O - 3510 cc/4450 cc.  WBC - 17.4 Hgb 10.4.  General: alert and cooperative Resp: clear to auscultation bilaterally Cardio: regular rate and rhythm, S1, S2 normal, no murmur, click, rub or gallop GI: soft, non-tender; bowel sounds normal; no masses,  no organomegaly and incision: clean, dry and intact Vaginal Bleeding: none  Assessment: s/p Procedure(s) with comments: HYSTERECTOMY ABDOMINAL WITH Bilateral SALPINGECTOMY with Lysis of Adhesions (Bilateral) - 3 hours: stable  Plan: Advance diet Encourage ambulation Advance to PO medication Discontinue IV fluids  Plan will be for oral dilaudid when taking po well. Will plan to recheck CBC in am on 09/26/16.   LOS: 1 day    Brook Matthew Saras 09/25/2016, 11:34 AM

## 2016-09-26 ENCOUNTER — Telehealth: Payer: Self-pay | Admitting: Obstetrics and Gynecology

## 2016-09-26 LAB — CBC
HCT: 29.6 % — ABNORMAL LOW (ref 36.0–46.0)
HCT: 31.2 % — ABNORMAL LOW (ref 36.0–46.0)
Hemoglobin: 9.6 g/dL — ABNORMAL LOW (ref 12.0–15.0)
Hemoglobin: 10 g/dL — ABNORMAL LOW (ref 12.0–15.0)
MCH: 26.5 pg (ref 26.0–34.0)
MCH: 26.3 pg (ref 26.0–34.0)
MCHC: 32.4 g/dL (ref 30.0–36.0)
MCHC: 32.1 g/dL (ref 30.0–36.0)
MCV: 81.8 fL (ref 78.0–100.0)
MCV: 82.1 fL (ref 78.0–100.0)
PLATELETS: 343 10*3/uL (ref 150–400)
Platelets: 352 10*3/uL (ref 150–400)
RBC: 3.62 MIL/uL — ABNORMAL LOW (ref 3.87–5.11)
RBC: 3.8 MIL/uL — ABNORMAL LOW (ref 3.87–5.11)
RDW: 14.2 % (ref 11.5–15.5)
RDW: 14.1 % (ref 11.5–15.5)
WBC: 13.7 10*3/uL — AB (ref 4.0–10.5)
WBC: 12.8 10*3/uL — ABNORMAL HIGH (ref 4.0–10.5)

## 2016-09-26 MED ORDER — IBUPROFEN 600 MG PO TABS: 600.0000 mg | ORAL_TABLET | Freq: Four times a day (QID) | ORAL | 0 refills | Status: DC | PRN

## 2016-09-26 MED ORDER — SIMETHICONE 80 MG PO CHEW
80.0000 mg | CHEWABLE_TABLET | Freq: Once | ORAL | Status: AC
  Administered 2016-09-26: 80 mg via ORAL
  Filled 2016-09-26: qty 1

## 2016-09-26 MED ORDER — HYDROMORPHONE HCL 2 MG PO TABS: 2.0000 mg | ORAL_TABLET | ORAL | 0 refills | Status: DC | PRN

## 2016-09-26 NOTE — Progress Notes (Signed)
Pt and her grown daughters verbalize understanding of d/c instructions, medications, follow up appts, when to seek medical attention, and belongings policy. All questions were answered to pts satisfaction. Pt requested a Rx for sleep. Operating MD was made aware but states that at this time she is uncomfortable doing this. Pt is aware and understands. Pts IV was d/c without complications. Pt has copy of d/c instructions, Recovering from Surgery pamphlet and her prescriptions in hand at this time. Pts daughters are with her and are driving her home. Pt was escorted to the main entrance via wheelchair. Pt was assisted to front seat of car by me and Alinda Deem, RN. Marry Guan

## 2016-09-26 NOTE — Plan of Care (Signed)
Problem: Bowel/Gastric: Goal: Gastrointestinal status for postoperative course will improve Outcome: Completed/Met Date Met: 09/26/16 Pt is passing gas.

## 2016-09-26 NOTE — Telephone Encounter (Signed)
The patient called c/o severe pain with urination this evening. She is POD#2 s/p TAH/BS. She had her catheter removed yesterday, no pain with urination until today. Prior to leaving the hospital the burning with urination was mild. Now that she is home and voided, the burning was severe. Some SP pain prior to voiding. Voiding frequently, normal amounts, hydrating well. I told her I thought she needed to be evaluated, she declined going to the ER. I suggested that she take AZO and see Dr Quincy Simmonds in the morning. She will do that and call back if she has worsening symptoms overnight.  I left a message with the front desk and told the patient to call first thing in the am.

## 2016-09-26 NOTE — Progress Notes (Signed)
2 Days Post-Op Procedure(s) (LRB): HYSTERECTOMY ABDOMINAL WITH Bilateral SALPINGECTOMY with Lysis of Adhesions (Bilateral)  Subjective: Patient reports incisional pain, tolerating PO and no problems voiding.   Dilaudid po working well to treat pain.  Some dizziness when up to bathroom.   Objective: I have reviewed patient's vital signs, intake and output and labs. Vitals:   09/25/16 2247 09/26/16 0612  BP: 111/73 124/80  Pulse: 71 100  Resp: 18 18  Temp: 98.9 F (37.2 C) 98.3 F (36.8 C)   UO - 2150 cc.  WBC - 13.7 HGB - 9.6  General: alert and cooperative Resp: clear to auscultation bilaterally Cardio: regular rate and rhythm, S1, S2 normal, no murmur, click, rub or gallop GI: soft, non-tender; bowel sounds normal; no masses,  no organomegaly and incision: clean, dry and intact Extremities: Ted hose on. Vaginal Bleeding: none  Final pathology - uterine fibroids, benign.  Benign tubes.   Assessment: s/p Procedure(s) with comments: HYSTERECTOMY ABDOMINAL WITH Bilateral SALPINGECTOMY with Lysis of Adhesions (Bilateral) - 3 hours: progressing well and anemia  Plan: Encourage ambulation Will recheck CBC this afternoon at 2:00.  If CBC stable, will discharge to home.  I have already reviewed instructions and signed a prescription for Dilaudid po.  She will continue with Motrin 600 mg po q 6 hours prn.  Instructions and precautions are given in a verbal and written form.  Has follow up in office on Oct. 2.    LOS: 2 days    Arloa Koh 09/26/2016, 9:11 AM

## 2016-09-26 NOTE — Discharge Instructions (Signed)
Abdominal Hysterectomy, Care After Refer to this sheet in the next few weeks. These instructions provide you with information on caring for yourself after your procedure. Your health care provider may also give you more specific instructions. Your treatment has been planned according to current medical practices, but problems sometimes occur. Call your health care provider if you have any problems or questions after your procedure.  WHAT TO EXPECT AFTER THE PROCEDURE After your procedure, it is typical to have the following:  Pain.  Feeling tired.  Poor appetite.  Less interest in sex. It takes 4-6 weeks to recover from this surgery.  HOME CARE INSTRUCTIONS   Take pain medicines only as directed by your health care provider. Do not take over-the-counter pain medicines without checking with your health care provider first.  Change your bandage as directed by your health care provider.  Return to your health care provider to have your sutures taken out.  Take showers instead of baths for 2-3 weeks. Ask your health care provider when it is safe to start showering.  Do not douche, use tampons, or have sexual intercourse for at least 6 weeks or until your health care provider says you can.   Follow your health care provider's advice about exercise, lifting, driving, and general activities.  Get plenty of rest and sleep.   Do not lift anything heavier than a gallon of milk (about 10 lb [4.5 kg]) for the first month after surgery.  You can resume your normal diet if your health care provider says it is okay.   Do not drink alcohol until your health care provider says you can.   If you are constipated, ask your health care provider if you can take a mild laxative.  Eating foods high in fiber may also help with constipation. Eat plenty of raw fruits and vegetables, whole grains, and beans.  Drink enough fluids to keep your urine clear or pale yellow.   Try to have someone at  home with you for the first 1-2 weeks to help around the house.  Keep all follow-up appointments. SEEK MEDICAL CARE IF:   You have chills or fever.  You have swelling, redness, or pain in the area of your incision that is getting worse.   You have pus coming from the incision.   You notice a bad smell coming from the incision or bandage.   Your incision breaks open.   You feel dizzy or light-headed.   You have pain or bleeding when you urinate.   You have persistent diarrhea.   You have persistent nausea and vomiting.   You have abnormal vaginal discharge.   You have a rash.   You have any type of abnormal reaction or develop an allergy to your medicine.   Your pain medicine is not helping.  SEEK IMMEDIATE MEDICAL CARE IF:   You have a fever and your symptoms suddenly get worse.  You have severe abdominal pain.  You have chest pain.  You have shortness of breath.  You faint.  You have pain, swelling, or redness of your leg.  You have heavy vaginal bleeding with blood clots. MAKE SURE YOU:  Understand these instructions.  Will watch your condition.  Will get help right away if you are not doing well or get worse.   This information is not intended to replace advice given to you by your health care provider. Make sure you discuss any questions you have with your health care provider.   Document  Released: 07/05/2005 Document Revised: 01/06/2015 Document Reviewed: 10/08/2013 Elsevier Interactive Patient Education [employer].

## 2016-09-26 NOTE — Telephone Encounter (Signed)
Thank you :)

## 2016-09-27 ENCOUNTER — Ambulatory Visit (INDEPENDENT_AMBULATORY_CARE_PROVIDER_SITE_OTHER): Payer: BC Managed Care – PPO | Admitting: Obstetrics and Gynecology

## 2016-09-27 ENCOUNTER — Encounter: Payer: Self-pay | Admitting: Obstetrics and Gynecology

## 2016-09-27 VITALS — BP 130/76 | HR 80 | Temp 99.5°F | Ht 67.0 in | Wt 207.8 lb

## 2016-09-27 DIAGNOSIS — N390 Urinary tract infection, site not specified: Secondary | ICD-10-CM | POA: Diagnosis not present

## 2016-09-27 DIAGNOSIS — R300 Dysuria: Secondary | ICD-10-CM | POA: Diagnosis not present

## 2016-09-27 DIAGNOSIS — R82998 Other abnormal findings in urine: Secondary | ICD-10-CM

## 2016-09-27 LAB — POCT URINALYSIS DIPSTICK
Bilirubin, UA: NEGATIVE
Glucose, UA: NEGATIVE
Ketones, UA: NEGATIVE
NITRITE UA: POSITIVE
PROTEIN UA: NEGATIVE
UROBILINOGEN UA: NEGATIVE
pH, UA: 5

## 2016-09-27 MED ORDER — CIPROFLOXACIN HCL 500 MG PO TABS: 500.0000 mg | ORAL_TABLET | Freq: Two times a day (BID) | ORAL | 0 refills | Status: DC

## 2016-09-27 MED ORDER — PHENAZOPYRIDINE HCL 200 MG PO TABS: 200.0000 mg | ORAL_TABLET | Freq: Three times a day (TID) | ORAL | 0 refills | Status: DC | PRN

## 2016-09-27 NOTE — Progress Notes (Signed)
GYNECOLOGY  VISIT   HPI: 45 y.o.   Legally Separated  African American  female   [phone] with Patient's last menstrual period was 08/23/2016.   here for complaint of dysuria. Patient is 3 days status post HYSTERECTOMY ABDOMINAL WITH Bilateral SALPINGECTOMY with Lysis of Adhesions (Bilateral ).  Patient called last hs after hours to provider on call and reported pain with urination.  Started before she left the hospital.  Reports burning.  Does not have frequency.   Not sure if it is having spasms.   Not a good appetite.  No nausea or vomiting.  No fevers.  No upper back pain.   Not taken temperature at home.   No vaginal bleeding.   Taking Dilaudid one every 3 - 4 hours.  This is controlling the pain well.   Urine dip today - 2+ WBC, trace RBCs, positive for nitrites.  GYNECOLOGIC HISTORY: Patient's last menstrual period was 08/23/2016. Contraception:  Hysterectomy Menopausal hormone therapy:  none Last mammogram:  12-12-15 --See Epic Last pap smear:   01-26-16 Neg:Neg HR HPV        OB History    Gravida Para Term Preterm AB Living   2 1 1   1 1    SAB TAB Ectopic Multiple Live Births   1       1         Patient Active Problem List   Diagnosis Date Noted  . Status post total abdominal hysterectomy 09/24/2016  . Obesity, unspecified 05/17/2014  . Allergic rhinitis, cause unspecified 05/17/2014    Past Medical History:  Diagnosis Date  . Allergy    Loratadine PRN  . Anemia   . Anovulatory    10+ years  . Anxiety   . Elevated hemoglobin A1c 2017  . Elevated WBC count 2017  . Fibroids   . Pre-diabetes   . SVD (spontaneous vaginal delivery)    x 1    Past Surgical History:  Procedure Laterality Date  . DILATION AND CURETTAGE OF UTERUS     x 4, 3 for heavy bleeding, 1 for MAB  . EYE SURGERY Bilateral    as a child at age 41 or 4  . FRACTURE SURGERY     R femur fracture age 51.  . HYSTERECTOMY ABDOMINAL WITH SALPINGECTOMY Bilateral 09/24/2016   Procedure: HYSTERECTOMY ABDOMINAL WITH Bilateral SALPINGECTOMY with Lysis of Adhesions;  Surgeon: Nunzio Cobbs, MD;  Location: [facility];  Service: Gynecology;  Laterality: Bilateral;  3 hours    Current Outpatient Prescriptions  Medication Sig Dispense Refill  . Ascorbic Acid (VITAMIN C PO) Take 1 tablet by mouth daily.     Marland Kitchen desloratadine (CLARINEX) 5 MG tablet Take 1 tablet (5 mg total) by mouth daily. 90 tablet 3  . HYDROmorphone (DILAUDID) 2 MG tablet Take 1 tablet (2 mg total) by mouth every 3 (three) hours as needed for severe pain. 30 tablet 0  . ibuprofen (ADVIL,MOTRIN) 600 MG tablet Take 1 tablet (600 mg total) by mouth every 6 (six) hours as needed (mild pain). 30 tablet 0  . LORazepam (ATIVAN) 1 MG tablet Take 1 mg by mouth every 8 (eight) hours.     No current facility-administered medications for this visit.      ALLERGIES: Robaxin [methocarbamol]  Family History  Problem Relation Age of Onset  . Stroke Mother   . Heart disease Mother     AMI x few with defibrillator/CHF  . COPD Mother   . Epilepsy  Mother   . Obesity Mother   . Heart attack Mother   . Multiple sclerosis Maternal Grandmother   . Cancer Paternal Grandmother   . Breast cancer Paternal Aunt   . Breast cancer Paternal Aunt   . Breast cancer Paternal Aunt     Social History   Social History  . Marital status: Legally Separated    Spouse name: N/A  . Number of children: N/A  . Years of education: N/A   Occupational History  . BUSINESS OWNER     daycare centers x 2   Social History Main Topics  . Smoking status: Never Smoker  . Smokeless tobacco: Never Used  . Alcohol use 0.0 oz/week     Comment: occasional wine   . Drug use: No  . Sexual activity: Yes    Partners: Female    Birth control/ protection: None     Comment: female partner   Other Topics Concern  . 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    ROS:  Pertinent items are noted in HPI.  PHYSICAL EXAMINATION:    BP 130/76 (BP Location: Left Arm, Patient Position: Supine, Cuff Size: Normal)   Pulse 80   Temp 99.5 F (37.5 C) (Oral)   Ht 5' 7"  (1.702 m)   Wt 207 lb 12.8 oz (94.3 kg)   LMP 08/23/2016   BMI 32.55 kg/m     General appearance: alert, cooperative and appears stated age   Lungs: clear to auscultation bilaterally  Heart: regular rate and rhythm Abdomen: incisions intact with Dermabond, no erythema.  Abdomen is soft, non-tender, no masses,  no organomegaly Back:  Negative CVA tenderness.               Bimanual Exam:   Vaginal cuff intact.  No bladder masses.  Appropriately tender.               Adnexa: no mass, fullness, tenderness           Chaperone was present for exam.  ASSESSMENT  Status post TAH/bilateral salpingectomy.  Dysuria.  UTI.   PLAN  Urine micro and culture.  Ciprofloxacin 500 mg po bid x 7 days.  Pyridium 200 mg po tid prn for 48 hours.  Hydrate well with water.  Follow up in 1 week for post op visit.  Call if symptoms are worsening.    An After Visit Summary was printed and given to the patient.  __15____ minutes face to face time of which over 50% was spent in counseling.

## 2016-09-28 LAB — URINALYSIS, MICROSCOPIC ONLY
CASTS: NONE SEEN [LPF]
Crystals: NONE SEEN [HPF]
YEAST: NONE SEEN [HPF]

## 2016-09-30 ENCOUNTER — Ambulatory Visit: Payer: BC Managed Care – PPO | Admitting: Obstetrics and Gynecology

## 2016-09-30 ENCOUNTER — Telehealth: Payer: Self-pay

## 2016-09-30 LAB — URINE CULTURE

## 2016-09-30 MED ORDER — NITROFURANTOIN MACROCRYSTAL 100 MG PO CAPS: 100.0000 mg | ORAL_CAPSULE | Freq: Two times a day (BID) | ORAL | 0 refills | Status: DC

## 2016-09-30 NOTE — Telephone Encounter (Signed)
-----   Message from Nunzio Cobbs, MD sent at 09/30/2016  3:45 PM EDT ----- Please report results of UC to patient.  She has a UTI that is E Coli and is resistant to Ciprofloxacin.  I am recommending Macrobid 100 mg po bid for 7 days.  Please send to pharmacy of choice.

## 2016-09-30 NOTE — Telephone Encounter (Signed)
Spoke with patient and given message of positive E.Coli on urine culture. Advised to stop the Ciprofloxacin and will send in RX for Macrobid 120m #14, 1 p.o. Bid to her pharmacy. Patient thanked me for my call.

## 2016-10-01 ENCOUNTER — Telehealth: Payer: Self-pay | Admitting: Obstetrics and Gynecology

## 2016-10-01 NOTE — Telephone Encounter (Signed)
Spoke with patient. Advised as seen below per Dr. Gentry Fitz recommendations. Confirmed patient states the process of voiding that burns at the end. Patient ask again if she could take both antibiotics and I reviewed with patient again, she should only be taking Macrobid. Patient verbalizes understanding and is agreeable. Advised to call office with any additional questions or concerns.    Routing to provider for final review. Patient is agreeable to disposition. Will close encounter.     CC: Dr. Quincy Simmonds

## 2016-10-01 NOTE — Telephone Encounter (Signed)
Patient says the change in medication is not working. She is hurting worse than she was before.

## 2016-10-01 NOTE — Telephone Encounter (Signed)
She is on the correct antibiotic. She should not be on the cipro with the macrobid. The Azo should be controlling her pain. Can change to the prescription strength pyridium, 200 mg TID x 48 hours. If that's not helping, I would be concerned that something else is going on and she should be seen. Make sure it is the process of voiding that burns. If she has fevers, worsening belly pain, flank pain or feels she isn't emptying her bladder, she should be seen.

## 2016-10-01 NOTE — Discharge Summary (Signed)
Physician Discharge Summary  Patient ID: Christina Mora MRN: [id number] DOB/AGE: 03-28-1971 45 y.o.  Admit date: 09/24/2016 Discharge date:  09/26/16  Admission Diagnoses: 1.  Pelvic pain.  2.  Uterine fibroids. 3.  Mild leukocytosis - WBC 11.6.  Discharge Diagnoses:  1.  Pelvic pain.  2.  Uterine fibroids. 3.  Mild leukocytosis - WBC 12.8. 4.  Status post total abdominal hysterectomy with bilateral salpingectomy and lysis of adhesions. 5.  Post op anemia.     Active Problems:   Status post total abdominal hysterectomy   Discharged Condition: good  Hospital Course:  The patient was admitted on 09/24/16 for a total abdominal hysterectomy with bilateral salpingectomy, lysis of adhesions, which were performed without complication while under general anesthesia. She has been followed for uterine fibroids, pelvic pain, and intermittent mild leukocytosis. The patient's post op course was remarkable for post op pain. She had a low dose morphine PCA and Toradol for pain control initially, and this was converted over a high dose Dilaudid PCA.  An abdominal binder was helpful in controlling pain also. She was converted over to oral Dilaudid and Motrin on post op day one when she began taking po well.  She ambulated independently and wore PAS and Ted hose for DVT prophylaxis while in bed.  Her foley catheter were removed on post op day one, and she voided good volumes. The patient's vital signs remained stable and she demonstrated no signs of infection during her hospitalization.  The patient's post op day one Hgb was 10.4. On post op day two, her hemoglobin measured 9.6 in the morning and then 10.0 in the mid afternoon.  Her WBC at discharge was 12.8.  She had very minimal vaginal bleeding, and her incision(s) demonstrated no signs of erythema or significant drainage.  She was found to be in good condition and ready for discharge on post op day two   Consults: None  Significant Diagnostic Studies:  labs: See hospital course.  Treatments: surgery:  Total abdominal hysterectomy with bilateral salpingectomy, lysis of adhesions performed on 09/24/16.  Discharge Exam: Blood pressure 116/78, pulse 75, temperature 98.8 F (37.1 C), temperature source Oral, resp. rate 18, last menstrual period 08/23/2016, SpO2 100 %. General: alert and cooperative Resp: clear to auscultation bilaterally Cardio: regular rate and rhythm, S1, S2 normal, no murmur, click, rub or gallop GI: soft, non-tender; bowel sounds normal; no masses,  no organomegaly and incision: clean, dry and intact Extremities: Ted hose on. Vaginal Bleeding: none   Disposition: 01-Home or Self Care  Discharge instructions reviewed in verbal and written form.     Medication List    STOP taking these medications   naproxen sodium 550 MG tablet Commonly known as:  ANAPROX     TAKE these medications   desloratadine 5 MG tablet Commonly known as:  CLARINEX Take 1 tablet (5 mg total) by mouth daily.   HYDROmorphone 2 MG tablet Commonly known as:  DILAUDID Take 1 tablet (2 mg total) by mouth every 3 (three) hours as needed for severe pain.   ibuprofen 600 MG tablet Commonly known as:  ADVIL,MOTRIN Take 1 tablet (600 mg total) by mouth every 6 (six) hours as needed (mild pain). What changed:  medication strength  how much to take  reasons to take this   LORazepam 1 MG tablet Commonly known as:  ATIVAN Take 1 mg by mouth every 8 (eight) hours.   VITAMIN C PO Take 1 tablet by mouth daily.  Follow-up Information    Arloa Koh, MD On 09/30/2016.   Specialty:  Obstetrics and Gynecology Contact information: [address] [place] [address] [phone]           Signed: Arloa Koh 10/01/2016, 7:00 AM

## 2016-10-01 NOTE — Telephone Encounter (Signed)
Spoke with patient. Patient had Abdominal Hysterectomy 09/24/16. Patient states pain with urination has gotten worse since midnight last night.Patient reports she has good urine output and is able to stay hydrated. Patient states her antibiotic was changed yesterday form Cipro to [facility] and she states she feels worse since the change. Patient reports starting the [place] yesterday afternoon. Patient request "to take both antibiotics". I reviewed Dr. Elza Rafter recommendations as seen below and the reason for the change of medication. Advised patient not to take both antibiotics.  Patient states she has been taking AZO q4-5hrs with no relief. Offered patient OV with covering provider, patient declined at this time. Advised I would review with covering provider and return call with recommendations. Patient is agreeable.    Dr. Talbert Nan, please advise?   CC: Dr. Quincy Simmonds       ----- Message from Nunzio Cobbs, MD sent at 09/30/2016  3:45 PM EDT ----- Please report results of UC to patient.  She has a UTI that is E Coli and is resistant to Ciprofloxacin.  I am recommending Macrobid 100 mg po bid for 7 days.  Please send to pharmacy of choice.

## 2016-10-03 ENCOUNTER — Telehealth: Payer: Self-pay | Admitting: *Deleted

## 2016-10-03 NOTE — Telephone Encounter (Signed)
If patient is not feeling better, she can come in for re-evaluation and an injection of Ceftriaxone IM.

## 2016-10-03 NOTE — Telephone Encounter (Signed)
Left message for patient to return call to [place] at [phone].

## 2016-10-03 NOTE — Telephone Encounter (Signed)
Spoke with patient. Called to follow-up with patient to see how she is feeling after antibiotic change. Patient states she is "slowly feeling better". Patient reports she is feeling more comfortable than she was earlier in the week. Patient reports intermittent burning with urination vs. continuous. Advised of Dr. Elza Rafter recommendations as seen below. Patient states she would like to wait until 10/04/16 appointment and see how she feels. Advised I would update Dr. Quincy Simmonds and should she change her mind, call the office. Patient verbalizes understanding and is agreeable. Patient thankful for follow-up call.     Routing to provider for final review. Patient is agreeable to disposition. Will close encounter.

## 2016-10-04 ENCOUNTER — Ambulatory Visit (INDEPENDENT_AMBULATORY_CARE_PROVIDER_SITE_OTHER): Payer: BC Managed Care – PPO | Admitting: Obstetrics and Gynecology

## 2016-10-04 VITALS — BP 100/60 | HR 88 | Resp 16 | Ht 67.0 in | Wt 204.0 lb

## 2016-10-04 DIAGNOSIS — B9620 Unspecified Escherichia coli [E. coli] as the cause of diseases classified elsewhere: Secondary | ICD-10-CM | POA: Diagnosis not present

## 2016-10-04 DIAGNOSIS — N390 Urinary tract infection, site not specified: Secondary | ICD-10-CM | POA: Diagnosis not present

## 2016-10-04 MED ORDER — CEFTRIAXONE SODIUM 500 MG IJ SOLR
250.0000 mg | Freq: Once | INTRAMUSCULAR | Status: AC
  Administered 2016-10-04: 250 mg via INTRAMUSCULAR

## 2016-10-04 MED ORDER — LIDOCAINE HCL 1 % IJ SOLN: 250.0000 mg | Freq: Once | INTRAMUSCULAR | 0 refills | Status: DC

## 2016-10-04 NOTE — Progress Notes (Signed)
GYNECOLOGY  VISIT   HPI: 45 y.o.   Legally Separated  African American  female   [phone] with Patient's last menstrual period was 08/23/2016.   here for   Recheck  from 9/29/17HYSTERECTOMY ABDOMINAL WITH Bilateral SALPINGECTOMY with Lysis of Adhesions.  Partner is here for the visit today.  Here for follow up of E Coli UTI. Started on Ciprofloxacin initially, but sensitivities showed resistace to Ciprofloxacin.  Started Macrobid 3 days ago.   Stopped AZO.  Still some burning at the end of urination.  Better overall.   Some right sided pain. Stopped Dilaudid.  Took Motrin 600 mg last hs.  No fever, nausea or vomiting.  GYNECOLOGIC HISTORY: Patient's last menstrual period was 08/23/2016. Contraception:  Hysterectomy  Menopausal hormone therapy:  None Last mammogram:  12/12/15  Last pap smear:   01/26/16 Neg. HR HPV:neg         OB History    Gravida Para Term Preterm AB Living   2 1 1   1 1    SAB TAB Ectopic Multiple Live Births   1       1         Patient Active Problem List   Diagnosis Date Noted  . Status post total abdominal hysterectomy 09/24/2016  . Obesity, unspecified 05/17/2014  . Allergic rhinitis, cause unspecified 05/17/2014    Past Medical History:  Diagnosis Date  . Allergy    Loratadine PRN  . Anemia   . Anovulatory    10+ years  . Anxiety   . Elevated hemoglobin A1c 2017  . Elevated WBC count 2017  . Fibroids   . Pre-diabetes   . SVD (spontaneous vaginal delivery)    x 1    Past Surgical History:  Procedure Laterality Date  . DILATION AND CURETTAGE OF UTERUS     x 4, 3 for heavy bleeding, 1 for MAB  . EYE SURGERY Bilateral    as a child at age 89 or 4  . FRACTURE SURGERY     R femur fracture age 35.  . HYSTERECTOMY ABDOMINAL WITH SALPINGECTOMY Bilateral 09/24/2016   Procedure: HYSTERECTOMY ABDOMINAL WITH Bilateral SALPINGECTOMY with Lysis of Adhesions;  Surgeon: Nunzio Cobbs, MD;  Location: [facility];  Service: Gynecology;   Laterality: Bilateral;  3 hours    Current Outpatient Prescriptions  Medication Sig Dispense Refill  . Ascorbic Acid (VITAMIN C PO) Take 1 tablet by mouth daily.     Marland Kitchen desloratadine (CLARINEX) 5 MG tablet Take 1 tablet (5 mg total) by mouth daily. 90 tablet 3  . HYDROmorphone (DILAUDID) 2 MG tablet Take 1 tablet (2 mg total) by mouth every 3 (three) hours as needed for severe pain. 30 tablet 0  . ibuprofen (ADVIL,MOTRIN) 600 MG tablet Take 1 tablet (600 mg total) by mouth every 6 (six) hours as needed (mild pain). 30 tablet 0  . LORazepam (ATIVAN) 1 MG tablet Take 1 mg by mouth every 8 (eight) hours.    . nitrofurantoin (MACRODANTIN) 100 MG capsule Take 1 capsule by mouth 3 (three) times daily.  0   No current facility-administered medications for this visit.      ALLERGIES: Robaxin [methocarbamol]  Family History  Problem Relation Age of Onset  . Stroke Mother   . Heart disease Mother     AMI x few with defibrillator/CHF  . COPD Mother   . Epilepsy Mother   . Obesity Mother   . Heart attack Mother   . Multiple sclerosis  Maternal Grandmother   . Cancer Paternal Grandmother   . Breast cancer Paternal Aunt   . Breast cancer Paternal Aunt   . Breast cancer Paternal Aunt     Social History   Social History  . Marital status: Legally Separated    Spouse name: N/A  . Number of children: N/A  . Years of education: N/A   Occupational History  . BUSINESS OWNER     daycare centers x 2   Social History Main Topics  . Smoking status: Never Smoker  . Smokeless tobacco: Never Used  . Alcohol use 0.0 oz/week     Comment: occasional wine   . Drug use: No  . Sexual activity: Yes    Partners: Female    Birth control/ protection: None     Comment: female partner   Other Topics Concern  . 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    ROS:  Pertinent items are noted in HPI.  PHYSICAL EXAMINATION:    BP 100/60 (BP Location: Right Arm, Patient Position: Sitting, Cuff Size: Normal)   Pulse 88   Resp 16   Ht 5' 7"  (1.702 m)   Wt 204 lb (92.5 kg)   LMP 08/23/2016   BMI 31.95 kg/m     General appearance: alert, cooperative and appears stated age   Abdomen: incision intact and without herniation, soft, non-tender, no masses,  no organomegaly   Chaperone was present for exam.  ASSESSMENT  Status post TAH/bilateral salpingectomy.  E Coli UTI, resistant to Cipro.  Now of Macrobid but still having some dysuria.   PLAN  Ceftriaxone 250 mg now.  Continue Macrobid and finish abx course.  Hydrate well.  Reinforced decreased activity and no driving yet.  Follow up for 6 week post op check, sooner as needed.    An After Visit Summary was printed and given to the patient.  ___15___ minutes face to face time of which over 50% was spent in counseling.

## 2016-10-06 ENCOUNTER — Encounter: Payer: Self-pay | Admitting: Obstetrics and Gynecology

## 2016-10-09 NOTE — Telephone Encounter (Signed)
Routing to provider for final review. Patient is agreeable to disposition. Will close encounter.

## 2016-10-17 ENCOUNTER — Telehealth: Payer: Self-pay | Admitting: Obstetrics and Gynecology

## 2016-10-17 NOTE — Telephone Encounter (Signed)
Patient has some questions she had surgery on 09/24/16.

## 2016-10-17 NOTE — Telephone Encounter (Signed)
Spoke with patient. Advised of message as seen below from [place]. Patient is agreeable and verbalizes understanding.  Routing to provider for final review. Patient agreeable to disposition. Will close encounter.

## 2016-10-17 NOTE — Telephone Encounter (Signed)
I agree with your recommendations about sexual activity.  Orgasm Ok.  Vaginal penetration not ok until after I see her for her 6 week post op visit.  If the suture is bothersome, please return and I will clip it!

## 2016-10-17 NOTE — Telephone Encounter (Signed)
Spoke with patient. Patient had an abdominal hysterectomy with bilateral salpingectomy with lysis of adhesions on 09/24/2016. Patient states that she was advised not to have intercourse for 6 weeks following surgery. Patient is asking if it is okay to have an orgasm or if that will cause problems. Advised as long as orgasm does not occur from penial penetration this should be okay, but I will review this with Dr.Silva. Patient also states that she has a "white thread" that is close to where her incision is on her right side that is sticking out of her abdomen. "Thread" is 3 mm in length. Advised it is most likely a stitch she is seeing. Reports the incision as healed well without and redness. States there is "very minimal swelling" around the stitch. "I think this is because I keep touching it." Advised I will speak with Dr.Silva and return call with recommendations. Patient is agreeable.

## 2016-10-19 DIAGNOSIS — F411 Generalized anxiety disorder: Secondary | ICD-10-CM | POA: Diagnosis not present

## 2016-10-23 ENCOUNTER — Telehealth: Payer: Self-pay | Admitting: Obstetrics and Gynecology

## 2016-10-23 NOTE — Telephone Encounter (Signed)
Patient's friend dropped form off to be completed for patient.  Patient is aware that there is a $25 fee to complete and will be calling in to make the payment

## 2016-10-30 DIAGNOSIS — F411 Generalized anxiety disorder: Secondary | ICD-10-CM | POA: Diagnosis not present

## 2016-10-30 NOTE — Telephone Encounter (Signed)
[employer] forms completed and forward to Dr Quincy Simmonds for review

## 2016-11-04 ENCOUNTER — Encounter: Payer: Self-pay | Admitting: Obstetrics and Gynecology

## 2016-11-04 ENCOUNTER — Ambulatory Visit (INDEPENDENT_AMBULATORY_CARE_PROVIDER_SITE_OTHER): Payer: BC Managed Care – PPO | Admitting: Obstetrics and Gynecology

## 2016-11-04 VITALS — BP 112/70 | HR 80 | Resp 16 | Ht 67.0 in | Wt 210.0 lb

## 2016-11-04 DIAGNOSIS — D72829 Elevated white blood cell count, unspecified: Secondary | ICD-10-CM | POA: Diagnosis not present

## 2016-11-04 DIAGNOSIS — Z9889 Other specified postprocedural states: Secondary | ICD-10-CM

## 2016-11-04 LAB — CBC WITH DIFFERENTIAL/PLATELET
BASOS ABS: 0 {cells}/uL (ref 0–200)
Basophils Relative: 0 %
EOS ABS: 192 {cells}/uL (ref 15–500)
EOS PCT: 2 %
HEMATOCRIT: 36.4 % (ref 35.0–45.0)
HEMOGLOBIN: 11.5 g/dL — AB (ref 11.7–15.5)
LYMPHS ABS: 2208 {cells}/uL (ref 850–3900)
Lymphocytes Relative: 23 %
MCH: 26 pg — AB (ref 27.0–33.0)
MCHC: 31.6 g/dL — ABNORMAL LOW (ref 32.0–36.0)
MCV: 82.4 fL (ref 80.0–100.0)
MONO ABS: 576 {cells}/uL (ref 200–950)
MPV: 8.8 fL (ref 7.5–12.5)
Monocytes Relative: 6 %
NEUTROS ABS: 6624 {cells}/uL (ref 1500–7800)
NEUTROS PCT: 69 %
Platelets: 437 10*3/uL — ABNORMAL HIGH (ref 140–400)
RBC: 4.42 MIL/uL (ref 3.80–5.10)
RDW: 14.4 % (ref 11.0–15.0)
WBC: 9.6 10*3/uL (ref 3.8–10.8)

## 2016-11-04 NOTE — Progress Notes (Signed)
GYNECOLOGY  VISIT   HPI: 45 y.o.   Legally Separated  African American  female   [phone] with Patient's last menstrual period was 08/23/2016.   here for  6 week post op.  Status post TAH/bilateral salpingectomy. Feels really good.  Pain is essentially gone from surgery and from prior.  Vaginal discharge is gone.   Feels a sensation with voiding, but it is not painful.  Felt much better after she treated her post op UTI.  Some discomfort around the incision if sits for a long time.  GYNECOLOGIC HISTORY: Patient's last menstrual period was 08/23/2016. Contraception:  Hysterectomy Menopausal hormone therapy:  None Last mammogram:  12/02/15 Last pap smear:   01/26/16 Neg. HR HPV:neg         OB History    Gravida Para Term Preterm AB Living   2 1 1   1 1    SAB TAB Ectopic Multiple Live Births   1       1         Patient Active Problem List   Diagnosis Date Noted  . Status post total abdominal hysterectomy 09/24/2016  . Obesity, unspecified 05/17/2014  . Allergic rhinitis, cause unspecified 05/17/2014    Past Medical History:  Diagnosis Date  . Allergy    Loratadine PRN  . Anemia   . Anovulatory    10+ years  . Anxiety   . Elevated hemoglobin A1c 2017  . Elevated WBC count 2017  . Fibroids   . Pre-diabetes   . SVD (spontaneous vaginal delivery)    x 1    Past Surgical History:  Procedure Laterality Date  . DILATION AND CURETTAGE OF UTERUS     x 4, 3 for heavy bleeding, 1 for MAB  . EYE SURGERY Bilateral    as a child at age 53 or 4  . FRACTURE SURGERY     R femur fracture age 8.  . HYSTERECTOMY ABDOMINAL WITH SALPINGECTOMY Bilateral 09/24/2016   Procedure: HYSTERECTOMY ABDOMINAL WITH Bilateral SALPINGECTOMY with Lysis of Adhesions;  Surgeon: Nunzio Cobbs, MD;  Location: [facility];  Service: Gynecology;  Laterality: Bilateral;  3 hours    Current Outpatient Prescriptions  Medication Sig Dispense Refill  . Ascorbic Acid (VITAMIN C PO) Take 1 tablet by  mouth daily.     Marland Kitchen desloratadine (CLARINEX) 5 MG tablet Take 1 tablet (5 mg total) by mouth daily. 90 tablet 3  . LORazepam (ATIVAN) 1 MG tablet Take 1 mg by mouth every 8 (eight) hours.     No current facility-administered medications for this visit.      ALLERGIES: Robaxin [methocarbamol]  Family History  Problem Relation Age of Onset  . Stroke Mother   . Heart disease Mother     AMI x few with defibrillator/CHF  . COPD Mother   . Epilepsy Mother   . Obesity Mother   . Heart attack Mother   . Multiple sclerosis Maternal Grandmother   . Cancer Paternal Grandmother   . Breast cancer Paternal Aunt   . Breast cancer Paternal Aunt   . Breast cancer Paternal Aunt     Social History   Social History  . Marital status: Legally Separated    Spouse name: N/A  . Number of children: N/A  . Years of education: N/A   Occupational History  . BUSINESS OWNER     daycare centers x 2   Social History Main Topics  . Smoking status: Never Smoker  . Smokeless  tobacco: Never Used  . Alcohol use 0.0 oz/week     Comment: occasional wine   . Drug use: No  . Sexual activity: Yes    Partners: Female    Birth control/ protection: None     Comment: female partner   Other Topics Concern  . 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    ROS:  Pertinent items are noted in HPI.  PHYSICAL EXAMINATION:    BP 112/70 (BP Location: Right Arm, Patient Position: Sitting, Cuff Size: Normal)   Pulse 80   Resp 16   Ht 5' 7"  (1.702 m)   Wt 210 lb (95.3 kg)   LMP  08/23/2016   BMI 32.89 kg/m       Abdomen: Pfannenstiel incision in intact, soft, non-tender, no masses,  no organomegaly   Pelvic: External genitalia:  no lesions              Urethra:  normal appearing urethra with no masses, tenderness or lesions              Bartholins and Skenes: normal                 Vagina: normal appearing vagina with normal color and discharge, no lesions              Cervix:  Absent.  Cuff intact, but suture is still healing and                 Bimanual Exam:  Uterus:  Absent.              Adnexa: no mass, fullness, tenderness      Chaperone was present for exam.  ASSESSMENT  Status post TAH/bilateral salpingectomy.  Elevated WBC - chronic.   PLAN  I am extending disability for 2 more weeks.  She is not ready to do heavy lifting today.  Check CBC with diff today.  Will need to see hematology is still elevated WBC.  Annual exam at end of February 2018.   An After Visit Summary was printed and given to the patient.

## 2016-11-05 NOTE — Telephone Encounter (Signed)
Spoke with patient regarding disability forms. Patients out of work has been extended. New forms completed to reflect two week extension and forwarded to Dr Quincy Simmonds for review with signature.

## 2016-11-05 NOTE — Telephone Encounter (Signed)
Contacted patient regarding FMLA forms, her voicemail box is full and I was unable to leave a message

## 2016-11-29 DIAGNOSIS — F329 Major depressive disorder, single episode, unspecified: Secondary | ICD-10-CM | POA: Diagnosis not present

## 2016-12-07 DIAGNOSIS — F329 Major depressive disorder, single episode, unspecified: Secondary | ICD-10-CM | POA: Diagnosis not present

## 2016-12-22 DIAGNOSIS — J329 Chronic sinusitis, unspecified: Secondary | ICD-10-CM | POA: Diagnosis not present

## 2016-12-30 DIAGNOSIS — F329 Major depressive disorder, single episode, unspecified: Secondary | ICD-10-CM | POA: Diagnosis not present

## 2017-03-19 NOTE — Progress Notes (Deleted)
46 y.o. G76P1011 Legally Separated African American female here for annual exam.    PCP:     Patient's last menstrual period was 08/23/2016.           Sexually active: {yes no:314532}  The current method of family planning is {contraception:315051}.    Exercising: {yes no:314532}  {types:19826} Smoker:  {YES NO:22349}  Health Maintenance: Pap:  *** History of abnormal Pap:  {YES NO:22349} MMG:  *** Colonoscopy:  *** BMD:   ***  Result  *** TDaP:  *** Gardasil:   {YES NO:22349} HIV: Hep C: Screening Labs:  Hb today: ***, Urine today: ***   reports that she has never smoked. She has never used smokeless tobacco. She reports that she drinks alcohol. She reports that she does not use drugs.  Past Medical History:  Diagnosis Date  . Allergy    Loratadine PRN  . Anemia   . Anovulatory    10+ years  . Anxiety   . Elevated hemoglobin A1c 2017  . Elevated WBC count 2017  . Fibroids   . Pre-diabetes   . SVD (spontaneous vaginal delivery)    x 1    Past Surgical History:  Procedure Laterality Date  . DILATION AND CURETTAGE OF UTERUS     x 4, 3 for heavy bleeding, 1 for MAB  . EYE SURGERY Bilateral    as a child at age 5 or 4  . FRACTURE SURGERY     R femur fracture age 6.  . HYSTERECTOMY ABDOMINAL WITH SALPINGECTOMY Bilateral 09/24/2016   Procedure: HYSTERECTOMY ABDOMINAL WITH Bilateral SALPINGECTOMY with Lysis of Adhesions;  Surgeon: Nunzio Cobbs, MD;  Location: [facility];  Service: Gynecology;  Laterality: Bilateral;  3 hours    Current Outpatient Prescriptions  Medication Sig Dispense Refill  . Ascorbic Acid (VITAMIN C PO) Take 1 tablet by mouth daily.     Marland Kitchen desloratadine (CLARINEX) 5 MG tablet Take 1 tablet (5 mg total) by mouth daily. 90 tablet 3  . LORazepam (ATIVAN) 1 MG tablet Take 1 mg by mouth every 8 (eight) hours.     No current facility-administered medications for this visit.     Family History  Problem Relation Age of Onset  . Stroke Mother    . Heart disease Mother     AMI x few with defibrillator/CHF  . COPD Mother   . Epilepsy Mother   . Obesity Mother   . Heart attack Mother   . Multiple sclerosis Maternal Grandmother   . Cancer Paternal Grandmother   . Breast cancer Paternal Aunt   . Breast cancer Paternal Aunt   . Breast cancer Paternal Aunt     ROS:  Pertinent items are noted in HPI.  Otherwise, a comprehensive ROS was negative.  Exam:   LMP 08/23/2016     General appearance: alert, cooperative and appears stated age Head: Normocephalic, without obvious abnormality, atraumatic Neck: no adenopathy, supple, symmetrical, trachea midline and thyroid normal to inspection and palpation Lungs: clear to auscultation bilaterally Breasts: normal appearance, no masses or tenderness, No nipple retraction or dimpling, No nipple discharge or bleeding, No axillary or supraclavicular adenopathy Heart: regular rate and rhythm Abdomen: soft, non-tender; no masses, no organomegaly Extremities: extremities normal, atraumatic, no cyanosis or edema Skin: Skin color, texture, turgor normal. No rashes or lesions Lymph nodes: Cervical, supraclavicular, and axillary nodes normal. No abnormal inguinal nodes palpated Neurologic: Grossly normal  Pelvic: External genitalia:  no lesions  Urethra:  normal appearing urethra with no masses, tenderness or lesions              Bartholins and Skenes: normal                 Vagina: normal appearing vagina with normal color and discharge, no lesions              Cervix: no lesions              Pap taken: {yes no:314532} Bimanual Exam:  Uterus:  normal size, contour, position, consistency, mobility, non-tender              Adnexa: no mass, fullness, tenderness              Rectal exam: {yes no:314532}.  Confirms.              Anus:  normal sphincter tone, no lesions  Chaperone was present for exam.  Assessment:   Well woman visit with normal exam.   Plan: Mammogram screening  discussed. Recommended self breast awareness. Pap and HR HPV as above. Guidelines for Calcium, Vitamin D, regular exercise program including cardiovascular and weight bearing exercise.   Follow up annually and prn.   Additional counseling given.  {yes [id number]. _______ minutes face to face time of which over 50% was spent in counseling.    After visit summary provided.

## 2017-03-20 ENCOUNTER — Ambulatory Visit: Payer: BC Managed Care – PPO | Admitting: Obstetrics and Gynecology

## 2017-03-20 ENCOUNTER — Encounter: Payer: Self-pay | Admitting: Obstetrics and Gynecology

## 2017-05-13 ENCOUNTER — Telehealth: Payer: Self-pay | Admitting: Family Medicine

## 2017-05-13 NOTE — Telephone Encounter (Signed)
Pt is needing a refill on desloratadine -she had refills but they expired because she didn't use them but is needing a refill now   Best number [phone]

## 2017-05-15 MED ORDER — DESLORATADINE 5 MG PO TABS: 5.0000 mg | ORAL_TABLET | Freq: Every day | ORAL | 0 refills | Status: DC

## 2017-06-05 DIAGNOSIS — L304 Erythema intertrigo: Secondary | ICD-10-CM | POA: Diagnosis not present

## 2017-06-05 DIAGNOSIS — D485 Neoplasm of uncertain behavior of skin: Secondary | ICD-10-CM | POA: Diagnosis not present

## 2017-06-05 DIAGNOSIS — L659 Nonscarring hair loss, unspecified: Secondary | ICD-10-CM | POA: Diagnosis not present

## 2017-06-05 DIAGNOSIS — L300 Nummular dermatitis: Secondary | ICD-10-CM | POA: Diagnosis not present

## 2017-06-27 ENCOUNTER — Ambulatory Visit: Payer: BC Managed Care – PPO | Admitting: Family Medicine

## 2017-07-11 ENCOUNTER — Ambulatory Visit (INDEPENDENT_AMBULATORY_CARE_PROVIDER_SITE_OTHER): Payer: BC Managed Care – PPO | Admitting: Family Medicine

## 2017-07-11 ENCOUNTER — Encounter: Payer: Self-pay | Admitting: Family Medicine

## 2017-07-11 VITALS — BP 112/76 | HR 85 | Temp 99.7°F | Resp 16 | Ht 67.72 in | Wt 221.0 lb

## 2017-07-11 DIAGNOSIS — R062 Wheezing: Secondary | ICD-10-CM

## 2017-07-11 DIAGNOSIS — R05 Cough: Secondary | ICD-10-CM

## 2017-07-11 DIAGNOSIS — R059 Cough, unspecified: Secondary | ICD-10-CM

## 2017-07-11 DIAGNOSIS — J22 Unspecified acute lower respiratory infection: Secondary | ICD-10-CM

## 2017-07-11 MED ORDER — DOXYCYCLINE HYCLATE 100 MG PO CAPS: 100.0000 mg | ORAL_CAPSULE | Freq: Two times a day (BID) | ORAL | 0 refills | Status: DC

## 2017-07-11 MED ORDER — BENZONATATE 100 MG PO CAPS: 100.0000 mg | ORAL_CAPSULE | Freq: Three times a day (TID) | ORAL | 0 refills | Status: DC | PRN

## 2017-07-11 NOTE — Patient Instructions (Addendum)
IF you received an x-ray today, you will receive an invoice from [facility]. Please contact [facility] at [phone] with questions or concerns regarding your invoice.   IF you received labwork today, you will receive an invoice from [facility]. Please contact [facility] at [phone] with questions or concerns regarding your invoice.   Our billing staff will not be able to assist you with questions regarding bills from these companies.  You will be contacted with the lab results as soon as they are available. The fastest way to get your results is to activate your My Chart account. Instructions are located on the last page of this paperwork. If you have not heard from [place] regarding the results in 2 weeks, please contact this office.      Acute Bronchitis, Adult Acute bronchitis is sudden (acute) swelling of the air tubes (bronchi) in the lungs. Acute bronchitis causes these tubes to fill with mucus, which can make it hard to breathe. It can also cause coughing or wheezing. In adults, acute bronchitis usually goes away within 2 weeks. A cough caused by bronchitis may last up to 3 weeks. Smoking, allergies, and asthma can make the condition worse. Repeated episodes of bronchitis may cause further lung problems, such as chronic obstructive pulmonary disease (COPD). What are the causes? This condition can be caused by germs and by substances that irritate the lungs, including:  Cold and flu viruses. This condition is most often caused by the same virus that causes a cold.  Bacteria.  Exposure to tobacco smoke, dust, fumes, and air pollution.  What increases the risk? This condition is more likely to develop in people who:  Have close contact with someone with acute bronchitis.  Are exposed to lung irritants, such as tobacco smoke, dust, fumes, and vapors.  Have a weak immune system.  Have a respiratory condition such as asthma.  What are the signs or  symptoms? Symptoms of this condition include:  A cough.  Coughing up clear, yellow, or green mucus.  Wheezing.  Chest congestion.  Shortness of breath.  A fever.  Body aches.  Chills.  A sore throat.  How is this diagnosed? This condition is usually diagnosed with a physical exam. During the exam, your health care provider may order tests, such as chest X-rays, to rule out other conditions. He or she may also:  Test a sample of your mucus for bacterial infection.  Check the level of oxygen in your blood. This is done to check for pneumonia.  Do a chest X-ray or lung function testing to rule out pneumonia and other conditions.  Perform blood tests.  Your health care provider will also ask about your symptoms and medical history. How is this treated? Most cases of acute bronchitis clear up over time without treatment. Your health care provider may recommend:  Drinking more fluids. Drinking more makes your mucus thinner, which may make it easier to breathe.  Taking a medicine for a fever or cough.  Taking an antibiotic medicine.  Using an inhaler to help improve shortness of breath and to control a cough.  Using a cool mist vaporizer or humidifier to make it easier to breathe.  Follow these instructions at home: Medicines  Take over-the-counter and prescription medicines only as told by your health care provider.  If you were prescribed an antibiotic, take it as told by your health care provider. Do not stop taking the antibiotic even if you start to feel better. General instructions  Get plenty of rest.  Drink enough fluids to keep your urine clear or pale yellow.  Avoid smoking and secondhand smoke. Exposure to cigarette smoke or irritating chemicals will make bronchitis worse. If you smoke and you need help quitting, ask your health care provider. Quitting smoking will help your lungs heal faster.  Use an inhaler, cool mist vaporizer, or humidifier as told  by your health care provider.  Keep all follow-up visits as told by your health care provider. This is important. How is this prevented? To lower your risk of getting this condition again:  Wash your hands often with soap and water. If soap and water are not available, use hand sanitizer.  Avoid contact with people who have cold symptoms.  Try not to touch your hands to your mouth, nose, or eyes.  Make sure to get the flu shot every year.  Contact a health care provider if:  Your symptoms do not improve in 2 weeks of treatment. Get help right away if:  You cough up blood.  You have chest pain.  You have severe shortness of breath.  You become dehydrated.  You faint or keep feeling like you are going to faint.  You keep vomiting.  You have a severe headache.  Your fever or chills gets worse. This information is not intended to replace advice given to you by your health care provider. Make sure you discuss any questions you have with your health care provider. Document Released: 01/23/2005 Document Revised: 07/10/2016 Document Reviewed: 06/05/2016 Elsevier Interactive Patient Education  2017 [employer].

## 2017-07-11 NOTE — Progress Notes (Signed)
Subjective:    Patient ID: Christina Mora, female    DOB: 05-Mar-1971, 46 y.o.   MRN: [id number]  07/11/2017  Cough   HPI This 46 y.o. female presents for evaluation of cough.  Got a cold in May end; bad cough with it.  Unable to shake cold.  Keeping up at night.  Coughs through the day and night.  Low grade fever Tmax 99-100.  Fatigued/tired.  Two foster children recently; thought due to fatigue.  Will wheeze but coughing will clear it. Sptum light yellow.  Taking tessalon perles with mild relief.  Fisherman Friend's lozenges. Traveled to [place] in March.  Traveled throughout the [place] in July.     Review of Systems  Constitutional: Positive for diaphoresis, fatigue and fever. Negative for chills.  HENT: Negative for congestion, ear pain, postnasal drip, rhinorrhea, sinus pain, sinus pressure, sore throat and voice change.   Eyes: Negative for visual disturbance.  Respiratory: Positive for cough and wheezing. Negative for shortness of breath.   Cardiovascular: Negative for chest pain, palpitations and leg swelling.  Gastrointestinal: Negative for abdominal pain, constipation, diarrhea, nausea and vomiting.  Endocrine: Negative for cold intolerance, heat intolerance, polydipsia, polyphagia and polyuria.  Neurological: Negative for dizziness, tremors, seizures, syncope, facial asymmetry, speech difficulty, weakness, light-headedness, numbness and headaches.    Past Medical History:  Diagnosis Date  . Allergy    Loratadine PRN  . Anemia   . Anovulatory    10+ years  . Anxiety   . Elevated hemoglobin A1c 2017  . Elevated WBC count 2017  . Fibroids   . Pre-diabetes   . SVD (spontaneous vaginal delivery)    x 1   Past Surgical History:  Procedure Laterality Date  . DILATION AND CURETTAGE OF UTERUS     x 4, 3 for heavy bleeding, 1 for MAB  . EYE SURGERY Bilateral    as a child at age 76 or 4  . FRACTURE SURGERY     R femur fracture age 8.  . HYSTERECTOMY ABDOMINAL WITH  SALPINGECTOMY Bilateral 09/24/2016   Procedure: HYSTERECTOMY ABDOMINAL WITH Bilateral SALPINGECTOMY with Lysis of Adhesions;  Surgeon: Nunzio Cobbs, MD;  Location: [facility];  Service: Gynecology;  Laterality: Bilateral;  3 hours   Allergies  Allergen Reactions  . Robaxin [Methocarbamol] Nausea And Vomiting    Social History   Social History  . Marital status: Legally Separated    Spouse name: N/A  . Number of children: N/A  . Years of education: N/A   Occupational History  . BUSINESS OWNER     daycare centers x 2   Social History Main Topics  . Smoking status: Never Smoker  . Smokeless tobacco: Never Used  . Alcohol use 0.0 oz/week     Comment: occasional wine   . Drug use: No  . Sexual activity: Yes    Partners: Female    Birth control/ protection: None     Comment: female partner   Other Topics Concern  . 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   Family History  Problem Relation Age of Onset  . Stroke Mother   . Heart disease Mother        AMI x few with defibrillator/CHF  . COPD Mother   . Epilepsy Mother   . Obesity Mother   . Heart attack Mother   . Multiple sclerosis Maternal Grandmother   . Cancer Paternal Grandmother   . Breast cancer Paternal Aunt   . Breast cancer Paternal Aunt   . Breast cancer Paternal Aunt        Objective:    BP 112/76   Pulse 85   Temp 99.7 F (37.6 C) (Oral)   Resp 16   Ht 5' 7.72" (1.72 m)   Wt 221 lb (100.2 kg)   LMP 08/23/2016   SpO2 99%   BMI 33.88 kg/m  Physical Exam    Constitutional: She is oriented to person, place, and time. She appears well-developed and well-nourished. No distress.  HENT:  Head: Normocephalic and atraumatic.  Right Ear: External ear normal.  Left Ear: External ear normal.  Nose: Nose normal.  Mouth/Throat: Oropharynx is clear and moist.  Eyes: Pupils are equal, round, and reactive to light. Conjunctivae and EOM are normal.  Neck: Normal range of motion. Neck supple. Carotid bruit is not present. No thyromegaly present.  Cardiovascular: Normal rate, regular rhythm, normal heart sounds and intact distal pulses.  Exam reveals no gallop and no friction rub.   No murmur heard. Pulmonary/Chest: Effort normal and breath sounds normal. She has no wheezes. She has no rales.  Abdominal: Soft. Bowel sounds are normal.  Lymphadenopathy:    She has no cervical adenopathy.  Neurological: She is alert and oriented to person, place, and time. No cranial nerve deficit.  Skin: Skin is warm and dry. No rash noted. She is not diaphoretic. No erythema. No pallor.  Psychiatric: She has a normal mood and affect. Her behavior is normal.        Assessment & Plan:   1. Cough   2. Wheezing   3. Lower respiratory infection    -New onset; rx for Doxy and Tessalon Perles. -pt declined rx for Prednisone or Albuterol yet recommend contacting office in 1-2 weeks if no improvement and will call in. -pt also declined CXR; will return in 2-3 weeks if no improvement for imaging.   No orders of the defined types were placed in this encounter.  Meds ordered this encounter  Medications  . doxycycline (VIBRAMYCIN) 100 MG capsule    Sig: Take 1 capsule (100 mg total) by mouth 2 (two) times daily.    Dispense:  20 capsule    Refill:  0  . benzonatate (TESSALON) 100 MG capsule    Sig: Take 1-2 capsules (100-200 mg total) by mouth 3 (three) times daily as needed for cough.    Dispense:  40 capsule    Refill:  0    No Follow-up on file.   Javonni Macke Elayne Guerin, M.D. Primary Care at [facility] [address] [place], [place]  [address] [phone] phone [phone] fax

## 2017-07-29 ENCOUNTER — Ambulatory Visit (INDEPENDENT_AMBULATORY_CARE_PROVIDER_SITE_OTHER): Payer: BC Managed Care – PPO | Admitting: Family Medicine

## 2017-07-29 ENCOUNTER — Ambulatory Visit (INDEPENDENT_AMBULATORY_CARE_PROVIDER_SITE_OTHER): Payer: BC Managed Care – PPO

## 2017-07-29 ENCOUNTER — Encounter: Payer: Self-pay | Admitting: Family Medicine

## 2017-07-29 VITALS — BP 109/72 | HR 86 | Temp 98.5°F | Resp 16 | Ht 66.5 in | Wt 220.0 lb

## 2017-07-29 DIAGNOSIS — R059 Cough, unspecified: Secondary | ICD-10-CM

## 2017-07-29 DIAGNOSIS — R062 Wheezing: Secondary | ICD-10-CM

## 2017-07-29 DIAGNOSIS — R05 Cough: Secondary | ICD-10-CM

## 2017-07-29 DIAGNOSIS — J209 Acute bronchitis, unspecified: Secondary | ICD-10-CM | POA: Diagnosis not present

## 2017-07-29 MED ORDER — ALBUTEROL SULFATE (2.5 MG/3ML) 0.083% IN NEBU
2.5000 mg | INHALATION_SOLUTION | Freq: Once | RESPIRATORY_TRACT | Status: AC
  Administered 2017-07-29: 2.5 mg via RESPIRATORY_TRACT

## 2017-07-29 MED ORDER — ALBUTEROL SULFATE HFA 108 (90 BASE) MCG/ACT IN AERS: 1.0000 | INHALATION_SPRAY | RESPIRATORY_TRACT | 0 refills | Status: DC | PRN

## 2017-07-29 MED ORDER — HYDROCODONE-HOMATROPINE 5-1.5 MG/5ML PO SYRP: ORAL_SOLUTION | ORAL | 0 refills | Status: DC

## 2017-07-29 MED ORDER — PREDNISONE 20 MG PO TABS: 40.0000 mg | ORAL_TABLET | Freq: Every day | ORAL | 0 refills | Status: DC

## 2017-07-29 NOTE — Patient Instructions (Addendum)
I do hear significant wheezing on your exam today. That is likely a contributor to your cough. Start prednisone 2 pills per day for the next 5 days. Albuterol inhaler up to every 4-6 hours as needed for cough or wheezing. Hydrocodone cough syrup at bedtime if needed only if you are not wheezing or short of breath (use albuterol for wheezing or shortness of breath, not hydrocodone).    Okay to be out of work for the next 2 days to allow relative rest, follow-up within the next 4-5 days if you're not improving. Sooner if worse.  Return to the clinic or go to the nearest emergency room if any of your symptoms worsen or new symptoms occur.   Bronchospasm, Adult Bronchospasm is a tightening of the airways going into the lungs. During an episode, it may be harder to breathe. You may cough, and you may make a whistling sound when you breathe (wheeze). This condition often affects people with asthma. What are the causes? This condition is caused by swelling and irritation in the airways. It can be triggered by:  An infection (common).  Seasonal allergies.  An allergic reaction.  Exercise.  Irritants. These include pollution, cigarette smoke, strong odors, aerosol sprays, and paint fumes.  Weather changes. Winds increase molds and pollens in the air. Cold air may cause swelling.  Stress and emotional upset.  What are the signs or symptoms? Symptoms of this condition include:  Wheezing. If the episode was triggered by an allergy, wheezing may start right away or hours later.  Nighttime coughing.  Frequent or severe coughing with a simple cold.  Chest tightness.  Shortness of breath.  Decreased ability to exercise.  How is this diagnosed? This condition is usually diagnosed with a review of your medical history and a physical exam. Tests, such as lung function tests, are sometimes done to look for other conditions. The need for a chest X-ray depends on where the wheezing occurs and  whether it is the first time you have wheezed. How is this treated? This condition may be treated with:  Inhaled medicines. These open up the airways and help you breathe. They can be taken with an inhaler or a nebulizer device.  Corticosteroid medicines. These may be given for severe bronchospasm, usually when it is associated with asthma.  Avoiding triggers, such as irritants, infection, or allergies.  Follow these instructions at home: Medicines  Take over-the-counter and prescription medicines only as told by your health care provider.  If you need to use an inhaler or nebulizer to take your medicine, ask your health care provider to explain how to use it correctly. If you were given a spacer, always use it with your inhaler. Lifestyle  Reduce the number of triggers in your home. To do this: ? Change your heating and air conditioning filter at least once a month. ? Limit your use of fireplaces and wood stoves. ? Do not smoke. Do not allow smoking in your home. ? Avoid using perfumes and fragrances. ? Get rid of pests, such as roaches and mice, and their droppings. ? Remove any mold from your home. ? Keep your house clean and dust free. Use unscented cleaning products. ? Replace carpet with wood, tile, or vinyl flooring. Carpet can trap dander and dust. ? Use allergy-proof pillows, mattress covers, and box spring covers. ? Wash bed sheets and blankets every week in hot water. Dry them in a dryer. ? Use blankets that are made of polyester or cotton. ?  Wash your hands often. ? Do not allow pets in your bedroom.  Avoid breathing in cold air when you exercise. General instructions  Have a plan for seeking medical care. Know when to call your health care provider and local emergency services, and where to get emergency care.  Stay up to date on your immunizations.  When you have an episode of bronchospasm, stay calm. Try to relax and breathe more slowly.  If you have asthma,  make sure you have an asthma action plan.  Keep all follow-up visits as told by your health care provider. This is important. Contact a health care provider if:  You have muscle aches.  You have chest pain.  The mucus that you cough up (sputum) changes from clear or white to yellow, green, gray, or bloody.  You have a fever.  Your sputum gets thicker. Get help right away if:  Your wheezing and coughing get worse, even after you take your prescribed medicines.  It gets even harder to breathe.  You develop severe chest pain. Summary  Bronchospasm is a tightening of the airways going into the lungs.  During an episode of bronchospasm, you may have a harder time breathing. You may cough and make a whistling sound when you breathe (wheeze).  Avoid exposure to triggers such as smoke, dust, mold, animal dander, and fragrances.  When you have an episode of bronchospasm, stay calm. Try to relax and breathe more slowly. This information is not intended to replace advice given to you by your health care provider. Make sure you discuss any questions you have with your health care provider. Document Released: 12/19/2003 Document Revised: 12/12/2016 Document Reviewed: 12/12/2016 Elsevier Interactive Patient Education  2017 [employer]  Acute Bronchitis, Adult Acute bronchitis is sudden (acute) swelling of the air tubes (bronchi) in the lungs. Acute bronchitis causes these tubes to fill with mucus, which can make it hard to breathe. It can also cause coughing or wheezing. In adults, acute bronchitis usually goes away within 2 weeks. A cough caused by bronchitis may last up to 3 weeks. Smoking, allergies, and asthma can make the condition worse. Repeated episodes of bronchitis may cause further lung problems, such as chronic obstructive pulmonary disease (COPD). What are the causes? This condition can be caused by germs and by substances that irritate the lungs, including:  Cold and flu  viruses. This condition is most often caused by the same virus that causes a cold.  Bacteria.  Exposure to tobacco smoke, dust, fumes, and air pollution.  What increases the risk? This condition is more likely to develop in people who:  Have close contact with someone with acute bronchitis.  Are exposed to lung irritants, such as tobacco smoke, dust, fumes, and vapors.  Have a weak immune system.  Have a respiratory condition such as asthma.  What are the signs or symptoms? Symptoms of this condition include:  A cough.  Coughing up clear, yellow, or green mucus.  Wheezing.  Chest congestion.  Shortness of breath.  A fever.  Body aches.  Chills.  A sore throat.  How is this diagnosed? This condition is usually diagnosed with a physical exam. During the exam, your health care provider may order tests, such as chest X-rays, to rule out other conditions. He or she may also:  Test a sample of your mucus for bacterial infection.  Check the level of oxygen in your blood. This is done to check for pneumonia.  Do a chest X-ray or lung  function testing to rule out pneumonia and other conditions.  Perform blood tests.  Your health care provider will also ask about your symptoms and medical history. How is this treated? Most cases of acute bronchitis clear up over time without treatment. Your health care provider may recommend:  Drinking more fluids. Drinking more makes your mucus thinner, which may make it easier to breathe.  Taking a medicine for a fever or cough.  Taking an antibiotic medicine.  Using an inhaler to help improve shortness of breath and to control a cough.  Using a cool mist vaporizer or humidifier to make it easier to breathe.  Follow these instructions at home: Medicines  Take over-the-counter and prescription medicines only as told by your health care provider.  If you were prescribed an antibiotic, take it as told by your health care  provider. Do not stop taking the antibiotic even if you start to feel better. General instructions  Get plenty of rest.  Drink enough fluids to keep your urine clear or pale yellow.  Avoid smoking and secondhand smoke. Exposure to cigarette smoke or irritating chemicals will make bronchitis worse. If you smoke and you need help quitting, ask your health care provider. Quitting smoking will help your lungs heal faster.  Use an inhaler, cool mist vaporizer, or humidifier as told by your health care provider.  Keep all follow-up visits as told by your health care provider. This is important. How is this prevented? To lower your risk of getting this condition again:  Wash your hands often with soap and water. If soap and water are not available, use hand sanitizer.  Avoid contact with people who have cold symptoms.  Try not to touch your hands to your mouth, nose, or eyes.  Make sure to get the flu shot every year.  Contact a health care provider if:  Your symptoms do not improve in 2 weeks of treatment. Get help right away if:  You cough up blood.  You have chest pain.  You have severe shortness of breath.  You become dehydrated.  You faint or keep feeling like you are going to faint.  You keep vomiting.  You have a severe headache.  Your fever or chills gets worse. This information is not intended to replace advice given to you by your health care provider. Make sure you discuss any questions you have with your health care provider. Document Released: 01/23/2005 Document Revised: 07/10/2016 Document Reviewed: 06/05/2016 Elsevier Interactive Patient Education  2017 [employer].       IF you received an x-ray today, you will receive an invoice from [facility]. Please contact [facility] at [phone] with questions or concerns regarding your invoice.   IF you received labwork today, you will receive an invoice from [place]. Please contact  [facility] at [phone] with questions or concerns regarding your invoice.   Our billing staff will not be able to assist you with questions regarding bills from these companies.  You will be contacted with the lab results as soon as they are available. The fastest way to get your results is to activate your My Chart account. Instructions are located on the last page of this paperwork. If you have not heard from [place] regarding the results in 2 weeks, please contact this office.

## 2017-07-29 NOTE — Progress Notes (Signed)
Subjective:  By signing my name below, I, [name], attest that this documentation has been prepared under the direction and in the presence of Merri Ray, MD. Electronically Signed: [name], [place]. 07/29/2017 , 1:57 PM .  Patient was seen in Room 10 .   Patient ID: Christina Mora, female    DOB: 05/20/1971, 46 y.o.   MRN: [id number] Chief Complaint  Patient presents with  . Follow-up    cough - would like Chest xray   HPI Christina Mora is a 46 y.o. female Here for follow up on cough. Patient was last seen by Dr. Tamala Julian on July 13th. She was treated with tessalon perles and doxycycline for 10 days. She declined chest xray, prednisone, or albuterol at that time. Her temperature was 99.7*F at last visit.   Patient reports coughing started since the end of May, around [place] day. She usually has a cold with cough that lasts for 1-2 weeks usually. However, this has been persistent for 9 weeks now. She notes 10 day full course of doxycycline didn't help the cough at all. She's been taking the tessalon perles with mild improvement. She denies any fever. She denies history of asthma, or history of inhaler/puffer use. She noticed wheezing at the end of her breaths. She also reports coughing up light yellow phlegm. Sh denies history of smoking. She mentions taken prednisone in the past, but had a side effect from it that she didn't like.   She denies heart issues, but did have murmur after pregancy about 20 years ago. She had albuterol nebulizer treatment at home, as her mother has asthma, and felt better from the treatment, but it caused her to become jittery. She denies reflux or heartburn.   Patient Active Problem List   Diagnosis Date Noted  . Status post total abdominal hysterectomy 09/24/2016  . Obesity, unspecified 05/17/2014  . Allergic rhinitis, cause unspecified 05/17/2014   Past Medical History:  Diagnosis Date  . Allergy    Loratadine PRN  . Anemia   . Anovulatory     10+ years  . Anxiety   . Elevated hemoglobin A1c 2017  . Elevated WBC count 2017  . Fibroids   . Pre-diabetes   . SVD (spontaneous vaginal delivery)    x 1   Past Surgical History:  Procedure Laterality Date  . DILATION AND CURETTAGE OF UTERUS     x 4, 3 for heavy bleeding, 1 for MAB  . EYE SURGERY Bilateral    as a child at age 17 or 4  . FRACTURE SURGERY     R femur fracture age 52.  . HYSTERECTOMY ABDOMINAL WITH SALPINGECTOMY Bilateral 09/24/2016   Procedure: HYSTERECTOMY ABDOMINAL WITH Bilateral SALPINGECTOMY with Lysis of Adhesions;  Surgeon: Nunzio Cobbs, MD;  Location: [facility];  Service: Gynecology;  Laterality: Bilateral;  3 hours   Allergies  Allergen Reactions  . Robaxin [Methocarbamol] Nausea And Vomiting   Prior to Admission medications   Medication Sig Start Date End Date Taking? Authorizing Provider  Ascorbic Acid (VITAMIN C PO) Take 1 tablet by mouth daily.     [provider]  benzonatate (TESSALON) 100 MG capsule Take 1-2 capsules (100-200 mg total) by mouth 3 (three) times daily as needed for cough. 07/11/17   Wardell Honour, MD  desloratadine (CLARINEX) 5 MG tablet Take 1 tablet (5 mg total) by mouth daily. 05/15/17   Wardell Honour, MD  doxycycline (VIBRAMYCIN) 100 MG capsule Take 1  capsule (100 mg total) by mouth 2 (two) times daily. 07/11/17   Wardell Honour, MD  LORazepam (ATIVAN) 1 MG tablet Take 1 mg by mouth every 8 (eight) hours.    [provider]   Social History   Social History  . Marital status: Legally Separated    Spouse name: N/A  . Number of children: N/A  . Years of education: N/A   Occupational History  . BUSINESS OWNER     daycare centers x 2   Social History Main Topics  . Smoking status: Never Smoker  . Smokeless tobacco: Never Used  . Alcohol use 0.0 oz/week     Comment: occasional wine   . Drug use: No  . Sexual activity: Yes    Partners: Female    Birth control/ protection: None     Comment:  female partner   Other Topics Concern  . 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   Review of Systems  Constitutional: Negative for chills, fatigue, fever and unexpected weight change.  HENT: Positive for congestion. Negative for rhinorrhea.   Respiratory: Positive for cough and wheezing.   Gastrointestinal: Negative for constipation, diarrhea, nausea and vomiting.  Skin: Negative for rash and wound.  Neurological: Negative for dizziness, weakness and headaches.       Objective:   Physical Exam  Constitutional: She is oriented to person, place, and time. She appears well-developed and well-nourished. No distress.  HENT:  Head: Normocephalic and atraumatic.  Eyes: Pupils are equal, round, and reactive to light. EOM are normal.  Neck: Neck supple. No JVD present.  Cardiovascular: Normal rate.   Pulmonary/Chest: Effort normal. No respiratory distress. She has wheezes (faint inspiratory, but primarily expiratory).  Musculoskeletal: Normal range of motion. She exhibits no edema.  Neurological: She is alert and oriented to person, place, and time.  Skin: Skin is warm and dry.  Psychiatric: She has a normal mood and affect. Her behavior is normal.  Nursing note and vitals reviewed.   Vitals:   07/29/17 1349  BP: 109/72  Pulse: 86  Resp: 16  Temp: 98.5 F (36.9 C)  TempSrc: Oral  SpO2: 97%  Weight: 220 lb (99.8 kg)  Height: 5' 6.5" (1.689 m)   [2:30PM] After albuterol 2.10m neb, still has slight expiratory wheeze.   Dg Chest 2 View  Result Date:  07/29/2017 CLINICAL DATA:  Nine week history of persistent cough and wheezing. EXAM: CHEST  2 VIEW COMPARISON:  02/17/2015. FINDINGS: Cardiomediastinal silhouette unremarkable, unchanged. Mildly prominent bronchovascular markings diffusely and mild central peribronchial thickening, more so than on the prior examination. Lungs otherwise clear. No localized airspace consolidation. No pleural effusions. No pneumothorax. Normal pulmonary vascularity. Degenerative changes involving the thoracic spine. IMPRESSION: Mild changes of acute bronchitis and/or asthma without focal airspace pneumonia. Electronically Signed   By: TEvangeline Dakin  M.D.   On: 07/29/2017 14:28        Assessment & Plan:     Christina Mora is a 47 y.o. female Bronchitis with bronchospasm - Plan: HYDROcodone-homatropine (HYCODAN) 5-1.5 MG/5ML syrup, predniSONE (DELTASONE) 20 MG tablet, albuterol (PROVENTIL HFA;VENTOLIN HFA) 108 (90 Base) MCG/ACT inhaler  Cough - Plan: DG Chest 2 View, HYDROcodone-homatropine (HYCODAN) 5-1.5 MG/5ML syrup  Wheezing - Plan: DG Chest 2 View, albuterol (PROVENTIL) (2.5 MG/3ML) 0.083% nebulizer solution 2.5 mg Suspected bronchitis with bronchospasm. Some improvement of wheeze with albuterol neb. Vital signs are stable. Chest x-ray without acute infiltrate, and just finished doxycycline.  - Prednisone 40 mg daily 5 days - potential side effects discussed, albuterol inhaler or nebulizer (has at home) if needed up to every 4 hours, but use should lessen as prednisone starts to work.  -Hycodan at bedtime if needed, advised not to use if wheezing or short of breath potential side effects discussed.  - work note 2 days, RTC precautions discussed   Meds ordered this encounter  Medications  . albuterol (PROVENTIL) (2.5 MG/3ML) 0.083% nebulizer solution 2.5 mg  . HYDROcodone-homatropine (HYCODAN) 5-1.5 MG/5ML syrup    Sig: 69mby mouth a bedtime as needed for cough.    Dispense:  120 mL    Refill:  0  .  predniSONE (DELTASONE) 20 MG tablet    Sig: Take 2 tablets (40 mg total) by mouth daily with breakfast.    Dispense:  10 tablet    Refill:  0  . albuterol (PROVENTIL HFA;VENTOLIN HFA) 108 (90 Base) MCG/ACT inhaler    Sig: Inhale 1-2 puffs into the lungs every 4 (four) hours as needed for wheezing or shortness of breath.    Dispense:  1 Inhaler    Refill:  0   Patient Instructions   I do hear significant wheezing on your exam today. That is likely a contributor to your cough. Start prednisone 2 pills per day for the next 5 days. Albuterol inhaler up to every 4-6 hours as needed for cough or wheezing. Hydrocodone cough syrup at bedtime if needed only if you are not wheezing or short of breath (use albuterol for wheezing or shortness of breath, not hydrocodone).    Okay to be out of work for the next 2 days to allow relative rest, follow-up within the next 4-5 days if you're not improving. Sooner if worse.  Return to the clinic or go to the nearest emergency room if any of your symptoms worsen or new symptoms occur.   Bronchospasm, Adult Bronchospasm is a tightening of the airways going into the lungs. During an episode, it may be harder to breathe. You may cough, and you may make a whistling sound when you breathe (wheeze). This condition often affects people with asthma. What are the causes? This condition is caused by swelling and irritation in the airways. It can be triggered by:  An infection (common).  Seasonal allergies.  An allergic reaction.  Exercise.  Irritants. These include pollution, cigarette smoke, strong odors, aerosol sprays, and paint fumes.  Weather changes. Winds increase molds and pollens in the air. Cold air may cause swelling.  Stress and emotional upset.  What are the signs or symptoms? Symptoms of this condition include:  Wheezing. If the episode was triggered by an allergy, wheezing may start right away or hours later.  Nighttime  coughing.  Frequent or severe coughing with a simple cold.  Chest tightness.  Shortness of breath.  Decreased ability to exercise.  How is this diagnosed? This condition is usually diagnosed with a review of your medical history and a physical exam. Tests, such as lung function tests, are sometimes done to look for other conditions. The need for a chest X-ray depends on where the wheezing occurs and whether it is the first time you have wheezed. How is this treated? This condition may be treated with:  Inhaled medicines. These open up the airways and help you breathe. They can be taken with an inhaler or a nebulizer device.  Corticosteroid medicines. These may be given for severe bronchospasm, usually when it is associated with asthma.  Avoiding triggers, such as irritants, infection, or allergies.  Follow these instructions at home: Medicines  Take over-the-counter and prescription medicines only as told by your health care provider.  If you need to use an inhaler or nebulizer to take your medicine, ask your health care provider to explain how to use it correctly. If you were given a spacer, always use it with your inhaler. Lifestyle  Reduce the number of triggers in your home. To do this: ? Change your heating and air conditioning filter at least once a month. ? Limit your use of fireplaces and wood stoves. ? Do not smoke. Do not allow smoking in your home. ? Avoid using perfumes and fragrances. ? Get rid of pests, such as roaches and mice, and their droppings. ? Remove any mold from your home. ? Keep your house clean and dust free. Use unscented cleaning products. ? Replace carpet with wood, tile, or vinyl flooring. Carpet can trap dander and dust. ? Use allergy-proof pillows, mattress covers, and box spring covers. ? Wash bed sheets and blankets every week in hot water. Dry them in a dryer. ? Use blankets that are made of polyester or cotton. ? Wash your hands  often. ? Do not allow pets in your bedroom.  Avoid breathing in cold air when you exercise. General instructions  Have a plan for seeking medical care. Know when to call your health care provider and local emergency services, and where to get emergency care.  Stay up to date on your immunizations.  When you have an episode of bronchospasm, stay calm. Try to relax and breathe more slowly.  If you have asthma, make sure you have an asthma action plan.  Keep all follow-up visits as told by your health care provider. This is important. Contact a health care provider if:  You have muscle aches.  You have chest pain.  The mucus that you cough up (sputum) changes from clear or white to yellow, green, gray, or bloody.  You have a fever.  Your sputum gets thicker. Get help right away if:  Your wheezing and coughing get worse, even after you take your prescribed medicines.  It gets even harder to breathe.  You develop severe chest pain. Summary  Bronchospasm is a tightening of the airways going into the lungs.  During an episode of bronchospasm, you may have a harder time breathing. You may cough and make a whistling sound when you breathe (wheeze).  Avoid exposure to triggers such as smoke, dust, mold, animal dander, and fragrances.  When you have an episode of bronchospasm, stay calm. Try to relax and breathe more slowly. This information is not intended to replace advice given to you by your health care provider. Make sure you discuss any questions you have with your health care provider. Document Released: 12/19/2003 Document Revised: 12/12/2016 Document Reviewed: 12/12/2016 Elsevier Interactive Patient  Education  2017 [employer]  Acute Bronchitis, Adult Acute bronchitis is sudden (acute) swelling of the air tubes (bronchi) in the lungs. Acute bronchitis causes these tubes to fill with mucus, which can make it hard to breathe. It can also cause coughing or wheezing. In  adults, acute bronchitis usually goes away within 2 weeks. A cough caused by bronchitis may last up to 3 weeks. Smoking, allergies, and asthma can make the condition worse. Repeated episodes of bronchitis may cause further lung problems, such as chronic obstructive pulmonary disease (COPD). What are the causes? This condition can be caused by germs and by substances that irritate the lungs, including:  Cold and flu viruses. This condition is most often caused by the same virus that causes a cold.  Bacteria.  Exposure to tobacco smoke, dust, fumes, and air pollution.  What increases the risk? This condition is more likely to develop in people who:  Have close contact with someone with acute bronchitis.  Are exposed to lung irritants, such as tobacco smoke, dust, fumes, and vapors.  Have a weak immune system.  Have a respiratory condition such as asthma.  What are the signs or symptoms? Symptoms of this condition include:  A cough.  Coughing up clear, yellow, or green mucus.  Wheezing.  Chest congestion.  Shortness of breath.  A fever.  Body aches.  Chills.  A sore throat.  How is this diagnosed? This condition is usually diagnosed with a physical exam. During the exam, your health care provider may order tests, such as chest X-rays, to rule out other conditions. He or she may also:  Test a sample of your mucus for bacterial infection.  Check the level of oxygen in your blood. This is done to check for pneumonia.  Do a chest X-ray or lung function testing to rule out pneumonia and other conditions.  Perform blood tests.  Your health care provider will also ask about your symptoms and medical history. How is this treated? Most cases of acute bronchitis clear up over time without treatment. Your health care provider may recommend:  Drinking more fluids. Drinking more makes your mucus thinner, which may make it easier to breathe.  Taking a medicine for a fever or  cough.  Taking an antibiotic medicine.  Using an inhaler to help improve shortness of breath and to control a cough.  Using a cool mist vaporizer or humidifier to make it easier to breathe.  Follow these instructions at home: Medicines  Take over-the-counter and prescription medicines only as told by your health care provider.  If you were prescribed an antibiotic, take it as told by your health care provider. Do not stop taking the antibiotic even if you start to feel better. General instructions  Get plenty of rest.  Drink enough fluids to keep your urine clear or pale yellow.  Avoid smoking and secondhand smoke. Exposure to cigarette smoke or irritating chemicals will make bronchitis worse. If you smoke and you need help quitting, ask your health care provider. Quitting smoking will help your lungs heal faster.  Use an inhaler, cool mist vaporizer, or humidifier as told by your health care provider.  Keep all follow-up visits as told by your health care provider. This is important. How is this prevented? To lower your risk of getting this condition again:  Wash your hands often with soap and water. If soap and water are not available, use hand sanitizer.  Avoid contact with people who have cold symptoms.  Try  not to touch your hands to your mouth, nose, or eyes.  Make sure to get the flu shot every year.  Contact a health care provider if:  Your symptoms do not improve in 2 weeks of treatment. Get help right away if:  You cough up blood.  You have chest pain.  You have severe shortness of breath.  You become dehydrated.  You faint or keep feeling like you are going to faint.  You keep vomiting.  You have a severe headache.  Your fever or chills gets worse. This information is not intended to replace advice given to you by your health care provider. Make sure you discuss any questions you have with your health care provider. Document Released: 01/23/2005  Document Revised: 07/10/2016 Document Reviewed: 06/05/2016 Elsevier Interactive Patient Education  2017 [employer].       IF you received an x-ray today, you will receive an invoice from [facility]. Please contact [facility] at [phone] with questions or concerns regarding your invoice.   IF you received labwork today, you will receive an invoice from [facility]. Please contact [facility] at [phone] with questions or concerns regarding your invoice.   Our billing staff will not be able to assist you with questions regarding bills from these companies.  You will be contacted with the lab results as soon as they are available. The fastest way to get your results is to activate your My Chart account. Instructions are located on the last page of this paperwork. If you have not heard from [place] regarding the results in 2 weeks, please contact this office.       I personally performed the services described in this documentation, which was scribed in my presence. The recorded information has been reviewed and considered for accuracy and completeness, addended by me as needed, and agree with information above.  Signed,   Merri Ray, MD [facility].  07/29/17 2:36 PM

## 2017-10-29 ENCOUNTER — Ambulatory Visit (INDEPENDENT_AMBULATORY_CARE_PROVIDER_SITE_OTHER): Payer: BC Managed Care – PPO | Admitting: Emergency Medicine

## 2017-10-29 ENCOUNTER — Encounter: Payer: Self-pay | Admitting: Emergency Medicine

## 2017-10-29 VITALS — BP 102/60 | HR 91 | Temp 98.9°F | Resp 16 | Ht 67.0 in | Wt 226.0 lb

## 2017-10-29 DIAGNOSIS — Z23 Encounter for immunization: Secondary | ICD-10-CM

## 2017-10-29 DIAGNOSIS — F411 Generalized anxiety disorder: Secondary | ICD-10-CM | POA: Diagnosis not present

## 2017-10-29 DIAGNOSIS — J309 Allergic rhinitis, unspecified: Secondary | ICD-10-CM | POA: Diagnosis not present

## 2017-10-29 MED ORDER — LORAZEPAM 1 MG PO TABS: 1.0000 mg | ORAL_TABLET | Freq: Three times a day (TID) | ORAL | 0 refills | Status: DC | PRN

## 2017-10-29 MED ORDER — DESLORATADINE 5 MG PO TABS: 5.0000 mg | ORAL_TABLET | Freq: Every day | ORAL | 6 refills | Status: DC

## 2017-10-29 NOTE — Progress Notes (Signed)
Christina Mora 46 y.o.   Chief Complaint  Patient presents with  . Headache    per patient the nose ache  . sneezing    off/on 3-4 days    HISTORY OF PRESENT ILLNESS: This is a 46 y.o. female needs medication for allergies.  HPI   Prior to Admission medications   Medication Sig Start Date End Date Taking? Authorizing Provider  albuterol (PROVENTIL HFA;VENTOLIN HFA) 108 (90 Base) MCG/ACT inhaler Inhale 1-2 puffs into the lungs every 4 (four) hours as needed for wheezing or shortness of breath. 07/29/17  Yes Wendie Agreste, MD  Ascorbic Acid (VITAMIN C PO) Take 1 tablet by mouth daily.    Yes [provider]  desloratadine (CLARINEX) 5 MG tablet Take 5 mg by mouth daily.   Yes [provider]  LORazepam (ATIVAN) 1 MG tablet Take 1 mg by mouth every 8 (eight) hours.   Yes [provider]  predniSONE (DELTASONE) 20 MG tablet Take 2 tablets (40 mg total) by mouth daily with breakfast. Patient not taking: Reported on 10/29/2017 07/29/17   Wendie Agreste, MD    Allergies  Allergen Reactions  . Robaxin [Methocarbamol] Nausea And Vomiting    Patient Active Problem List   Diagnosis Date Noted  . Status post total abdominal hysterectomy 09/24/2016  . Obesity, unspecified 05/17/2014  . Allergic rhinitis, cause unspecified 05/17/2014    Past Medical History:  Diagnosis Date  . Allergy    Loratadine PRN  . Anemia   . Anovulatory    10+ years  . Anxiety   . Elevated hemoglobin A1c 2017  . Elevated WBC count 2017  . Fibroids   . Pre-diabetes   . SVD (spontaneous vaginal delivery)    x 1    Past Surgical History:  Procedure Laterality Date  . DILATION AND CURETTAGE OF UTERUS     x 4, 3 for heavy bleeding, 1 for MAB  . EYE SURGERY Bilateral    as a child at age 12 or 4  . FRACTURE SURGERY     R femur fracture age 42.  . HYSTERECTOMY ABDOMINAL WITH SALPINGECTOMY Bilateral 09/24/2016   Procedure: HYSTERECTOMY ABDOMINAL WITH Bilateral  SALPINGECTOMY with Lysis of Adhesions;  Surgeon: Nunzio Cobbs, MD;  Location: [facility];  Service: Gynecology;  Laterality: Bilateral;  3 hours    Social History   Social History  . Marital status: Legally Separated    Spouse name: N/A  . Number of children: N/A  . Years of education: N/A   Occupational History  . BUSINESS OWNER     daycare centers x 2   Social History Main Topics  . Smoking status: Never Smoker  . Smokeless tobacco: Never Used  . Alcohol use 0.0 oz/week     Comment: occasional wine   . Drug use: No  . Sexual activity: Yes    Partners: Female    Birth control/ protection: None     Comment: female partner   Other Topics Concern  . 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    Family History  Problem Relation Age of Onset  . Stroke Mother   . Heart disease Mother        AMI x few with defibrillator/CHF  . COPD Mother   . Epilepsy Mother   . Obesity Mother   . Heart attack Mother   . Multiple sclerosis Maternal Grandmother   . Cancer Paternal Grandmother   . Breast cancer Paternal Aunt   . Breast cancer Paternal Aunt   . Breast cancer Paternal Aunt      Review of Systems  Constitutional: Negative.  Negative for chills and fever.  HENT: Positive for congestion.        Nose aches and frequent sneezing.  Eyes: Negative.  Negative for discharge and redness.  Respiratory: Negative.  Negative for cough and shortness of breath.   Cardiovascular: Negative.  Negative for chest pain and palpitations.  Gastrointestinal:  Negative for abdominal pain, blood in stool, diarrhea, nausea and vomiting.  Genitourinary: Negative.  Negative for dysuria and hematuria.  Musculoskeletal: Negative.  Negative for back pain and myalgias.  Skin: Negative.  Negative for rash.  Neurological: Positive for headaches. Negative for dizziness.  Endo/Heme/Allergies: Negative.   All other systems reviewed and are negative.  Vitals:   10/29/17 1517  BP: 102/60  Pulse: 91  Resp: 16  Temp: 98.9 F (37.2 C)  SpO2: 97%     Physical Exam  Constitutional: She is oriented to person, place, and time. She appears well-developed and well-nourished.  HENT:  Head: Normocephalic and atraumatic.  Right Ear: Tympanic membrane, external ear and ear canal normal.  Left Ear: Tympanic membrane, external ear and ear canal normal.  Nose: Nose normal.  Mouth/Throat: Oropharynx is clear and moist.  Eyes: Pupils are equal, round, and reactive to light. Conjunctivae and EOM are normal.  Neck: Normal range of motion. Neck supple.  Cardiovascular: Normal rate, regular rhythm and normal heart sounds.   Pulmonary/Chest: Effort normal and breath sounds normal.  Abdominal: Soft. Bowel sounds are normal. There is no tenderness.  Musculoskeletal: Normal range of motion.  Neurological: She is alert and oriented to person, place, and time. No sensory deficit. She exhibits normal muscle tone.  Skin: Skin is warm and dry. Capillary refill takes less than 2 seconds.  Psychiatric: She has a normal mood and affect. Her behavior is normal.  Vitals reviewed.  A total of 25 minutes was spent in the room with the patient, greater than 50% of which was in counseling/coordination of care regarding chronic medical problems.   ASSESSMENT & PLAN: Christina Mora was seen today for headache and sneezing.  Diagnoses and all orders for this visit:  Chronic allergic rhinitis -     desloratadine (CLARINEX) 5 MG tablet; Take 1 tablet (5 mg total) by mouth daily.  Need for  influenza vaccination -     Flu Vaccine QUAD 36+ mos IM  Anxiety state -     LORazepam (ATIVAN) 1 MG tablet; Take 1 tablet (1 mg total) by mouth every 8 (eight) hours as needed for anxiety.    Patient Instructions       IF you received an x-ray today, you will receive an invoice from [facility]. Please contact [facility] at [phone] with questions or concerns regarding  your invoice.   IF you received labwork today, you will receive an invoice from [facility]. Please contact [facility] at [phone] with questions or concerns regarding your invoice.   Our billing staff will not be able to assist you with questions regarding bills from these companies.  You will be contacted with the lab results as soon as they are available. The fastest way to get your results is to activate your My Chart account. Instructions are located on the last page of this paperwork. If you have not heard from [place] regarding the results in 2 weeks, please contact this office.     Nasal Allergies Nasal allergies are a reaction to allergens in the air. Allergens are tiny specks (particles) in the air that cause your body to have an allergic reaction. Nasal allergies are not passed from person to person (contagious). They cannot be cured, but they can be controlled. Common causes of nasal allergies include:  Pollen from grasses, trees, and weeds.  House dust mites.  Pet dander.  Mold.  Follow these instructions at home:  Avoid the allergen that is causing your symptoms, if you can.  Keep windows closed. If possible, use air conditioning when there is a lot of pollen in the air.  Do not use fans in your home.  Do not hang clothes outside to dry.  Wear sunglasses to keep pollen out of your eyes.  Wash your hands right away after you touch household pets.  Take over-the-counter and prescription medicines only as told by your doctor.  Keep all follow-up visits as told by your  doctor. This is important. Contact a doctor if:  You have a fever.  You have a cough that does not go away (is persistent).  You start to make whistling sounds when you breathe (wheeze).  Your symptoms do not get better with treatment.  You have thick fluid coming from your nose.  You start to have nosebleeds. Get help right away if:  Your tongue or your lips are swollen.  You have trouble breathing.  You feel light-headed or you feel like you are going to pass out (faint).  You have cold sweats. This information is not intended to replace advice given to you by your health care provider. Make sure you discuss any questions you have with your health care provider. Document Released: 04/17/2011 Document Revised: 05/23/2016 Document Reviewed: 06/28/2015 Elsevier Interactive Patient Education  2018 Elsevier Inc.      Agustina Caroli, MD [facility]

## 2017-10-29 NOTE — Patient Instructions (Addendum)
     IF you received an x-ray today, you will receive an invoice from [facility]. Please contact [facility] at [phone] with questions or concerns regarding your invoice.   IF you received labwork today, you will receive an invoice from [facility]. Please contact [facility] at [phone] with questions or concerns regarding your invoice.   Our billing staff will not be able to assist you with questions regarding bills from these companies.  You will be contacted with the lab results as soon as they are available. The fastest way to get your results is to activate your My Chart account. Instructions are located on the last page of this paperwork. If you have not heard from [place] regarding the results in 2 weeks, please contact this office.     Nasal Allergies Nasal allergies are a reaction to allergens in the air. Allergens are tiny specks (particles) in the air that cause your body to have an allergic reaction. Nasal allergies are not passed from person to person (contagious). They cannot be cured, but they can be controlled. Common causes of nasal allergies include:  Pollen from grasses, trees, and weeds.  House dust mites.  Pet dander.  Mold.  Follow these instructions at home:  Avoid the allergen that is causing your symptoms, if you can.  Keep windows closed. If possible, use air conditioning when there is a lot of pollen in the air.  Do not use fans in your home.  Do not hang clothes outside to dry.  Wear sunglasses to keep pollen out of your eyes.  Wash your hands right away after you touch household pets.  Take over-the-counter and prescription medicines only as told by your doctor.  Keep all follow-up visits as told by your doctor. This is important. Contact a doctor if:  You have a fever.  You have a cough that does not go away (is persistent).  You start to make whistling sounds when you breathe (wheeze).  Your symptoms do not get better  with treatment.  You have thick fluid coming from your nose.  You start to have nosebleeds. Get help right away if:  Your tongue or your lips are swollen.  You have trouble breathing.  You feel light-headed or you feel like you are going to pass out (faint).  You have cold sweats. This information is not intended to replace advice given to you by your health care provider. Make sure you discuss any questions you have with your health care provider. Document Released: 04/17/2011 Document Revised: 05/23/2016 Document Reviewed: 06/28/2015 Elsevier Interactive Patient Education  [employer].

## 2018-05-21 ENCOUNTER — Encounter: Payer: Self-pay | Admitting: Family Medicine

## 2018-09-26 DIAGNOSIS — B354 Tinea corporis: Secondary | ICD-10-CM | POA: Diagnosis not present

## 2018-09-26 DIAGNOSIS — B350 Tinea barbae and tinea capitis: Secondary | ICD-10-CM | POA: Diagnosis not present

## 2018-10-07 DIAGNOSIS — L42 Pityriasis rosea: Secondary | ICD-10-CM | POA: Diagnosis not present

## 2018-11-02 ENCOUNTER — Encounter: Payer: Self-pay | Admitting: Family Medicine

## 2018-11-02 ENCOUNTER — Other Ambulatory Visit: Payer: Self-pay

## 2018-11-02 ENCOUNTER — Ambulatory Visit (INDEPENDENT_AMBULATORY_CARE_PROVIDER_SITE_OTHER): Payer: BLUE CROSS/BLUE SHIELD | Admitting: Family Medicine

## 2018-11-02 VITALS — BP 125/84 | HR 89 | Temp 98.7°F | Resp 14 | Ht 67.0 in | Wt 218.8 lb

## 2018-11-02 DIAGNOSIS — F411 Generalized anxiety disorder: Secondary | ICD-10-CM | POA: Diagnosis not present

## 2018-11-02 DIAGNOSIS — Z13220 Encounter for screening for lipoid disorders: Secondary | ICD-10-CM

## 2018-11-02 DIAGNOSIS — Z0001 Encounter for general adult medical examination with abnormal findings: Secondary | ICD-10-CM

## 2018-11-02 DIAGNOSIS — Z0000 Encounter for general adult medical examination without abnormal findings: Secondary | ICD-10-CM

## 2018-11-02 DIAGNOSIS — J9801 Acute bronchospasm: Secondary | ICD-10-CM | POA: Diagnosis not present

## 2018-11-02 DIAGNOSIS — Z13228 Encounter for screening for other metabolic disorders: Secondary | ICD-10-CM

## 2018-11-02 DIAGNOSIS — Z1329 Encounter for screening for other suspected endocrine disorder: Secondary | ICD-10-CM | POA: Diagnosis not present

## 2018-11-02 DIAGNOSIS — R7303 Prediabetes: Secondary | ICD-10-CM

## 2018-11-02 DIAGNOSIS — Z1231 Encounter for screening mammogram for malignant neoplasm of breast: Secondary | ICD-10-CM

## 2018-11-02 DIAGNOSIS — Z130 Encounter for screening for diseases of the blood and blood-forming organs and certain disorders involving the immune mechanism: Secondary | ICD-10-CM | POA: Diagnosis not present

## 2018-11-02 MED ORDER — ALBUTEROL SULFATE HFA 108 (90 BASE) MCG/ACT IN AERS: 1.0000 | INHALATION_SPRAY | RESPIRATORY_TRACT | 0 refills | Status: DC | PRN

## 2018-11-02 MED ORDER — LORAZEPAM 1 MG PO TABS: 1.0000 mg | ORAL_TABLET | Freq: Three times a day (TID) | ORAL | 0 refills | Status: DC | PRN

## 2018-11-02 NOTE — Progress Notes (Signed)
11/4/201911:58 AM  Christina Mora 1971/07/10, 47 y.o. female [id number]  Chief Complaint  Patient presents with  . Annual Exam    would like to have blood sugar checked per dermatology possible pre diebetic    HPI:   Patient is a 47 y.o. female who presents today for CPE  Last CPE 2017 Cervical Cancer Screening: hysterectomy Breast Cancer Screening: 2017, [facility] Colorectal Cancer Screening: at age 61 Bone Density Testing: at age 49 HIV Screening: 2017 STI Screening: 2017 Seasonal Influenza Vaccination: has had this season at [employer] Td/Tdap Vaccination: 2012 Pneumococcal Vaccination: at age 36 Zoster Vaccination: after age 47 Frequency of Dental evaluation: Q6 months, [facility] Frequency of Eye evaluation: yearly, Dr Doyce Para Not fasting today   Fall Risk  11/02/2018 10/29/2017 07/29/2017 07/11/2017 01/26/2016  Falls in the past year? 0 No No No Yes  Number falls in past yr: - - - - 2 or more  Comment - - - - -  Injury with Fall? - - - - No     Depression screen [facility] 2/9 11/02/2018 10/29/2017 07/29/2017  Decreased Interest 0 0 0  Down, Depressed, Hopeless 0 0 0  PHQ - 2 Score 0 0 0    Allergies  Allergen Reactions  . Robaxin [Methocarbamol] Nausea And Vomiting    Prior to Admission medications   Medication Sig Start Date End Date Taking? Authorizing Provider  LORazepam (ATIVAN) 1 MG tablet Take 1 tablet (1 mg total) by mouth every 8 (eight) hours as needed for anxiety. 10/29/17  Yes Sagardia, Ines Bloomer, MD  Triamcinolone Acetonide (TRIAMCINOLONE 0.1 % CREAM : EUCERIN) CREA Apply 1 application topically 2 (two) times daily as needed.   Yes [provider]  albuterol (PROVENTIL HFA;VENTOLIN HFA) 108 (90 Base) MCG/ACT inhaler Inhale 1-2 puffs into the lungs every 4 (four) hours as needed for wheezing or shortness of breath. Patient not taking: Reported on 11/02/2018 07/29/17   Wendie Agreste, MD  Ascorbic Acid (VITAMIN C PO) Take 1 tablet by mouth daily.      [provider]    Past Medical History:  Diagnosis Date  . Allergy    Loratadine PRN  . Anemia   . Anovulatory    10+ years  . Anxiety   . Elevated hemoglobin A1c 2017  . Elevated WBC count 2017  . Fibroids   . Pre-diabetes   . SVD (spontaneous vaginal delivery)    x 1    Past Surgical History:  Procedure Laterality Date  . ABDOMINAL HYSTERECTOMY  09/24/2016  . DILATION AND CURETTAGE OF UTERUS     x 4, 3 for heavy bleeding, 1 for MAB  . EYE SURGERY Bilateral    as a child at age 57 or 4  . FRACTURE SURGERY     R femur fracture age 80.  . HYSTERECTOMY ABDOMINAL WITH SALPINGECTOMY Bilateral 09/24/2016   Procedure: HYSTERECTOMY ABDOMINAL WITH Bilateral SALPINGECTOMY with Lysis of Adhesions;  Surgeon: Nunzio Cobbs, MD;  Location: [facility];  Service: Gynecology;  Laterality: Bilateral;  3 hours    Social History   Tobacco Use  . Smoking status: Never Smoker  . Smokeless tobacco: Never Used  Substance Use Topics  . Alcohol use: Yes    Alcohol/week: 0.0 standard drinks    Comment: occasional wine     Family History  Problem Relation Age of Onset  . Stroke Mother   . Heart disease Mother        AMI x few  with defibrillator/CHF  . COPD Mother   . Epilepsy Mother   . Obesity Mother   . Heart attack Mother   . Multiple sclerosis Maternal Grandmother   . Cancer Paternal Grandmother   . Breast cancer Paternal Aunt   . Breast cancer Paternal Aunt   . Breast cancer Paternal Aunt     Review of Systems  Constitutional: Negative for chills and fever.  Respiratory: Negative for cough and shortness of breath.   Cardiovascular: Negative for chest pain, palpitations and leg swelling.  Gastrointestinal: Negative for abdominal pain, nausea and vomiting.  Genitourinary: Positive for frequency. Negative for urgency.  Skin: Positive for rash.  Psychiatric/Behavioral: Negative for depression. The patient is nervous/anxious. The patient does not have insomnia.     All other systems reviewed and are negative.    OBJECTIVE:  Blood pressure 125/84, pulse 89, temperature 98.7 F (37.1 C), temperature source Oral, resp. rate 14, height 5' 7"  (1.702 m), weight 218 lb 12.8 oz (99.2 kg), last menstrual period 08/23/2016, SpO2 99 %. Body mass index is 34.27 kg/m.   Physical Exam  Constitutional: She is oriented to person, place, and time. She appears well-developed and well-nourished.  HENT:  Head: Normocephalic and atraumatic.  Right Ear: Hearing, tympanic membrane, external ear and ear canal normal.  Left Ear: Hearing, tympanic membrane, external ear and ear canal normal.  Mouth/Throat: Oropharynx is clear and moist.  Eyes: Pupils are equal, round, and reactive to light. Conjunctivae and EOM are normal.  Neck: Neck supple. No thyromegaly present.  Cardiovascular: Normal rate, regular rhythm, normal heart sounds and intact distal pulses. Exam reveals no gallop and no friction rub.  No murmur heard. Pulmonary/Chest: Effort normal and breath sounds normal. She has no wheezes. She has no rales.  Abdominal: Soft. Bowel sounds are normal. She exhibits no distension and no mass. There is no tenderness.  Musculoskeletal: Normal range of motion. She exhibits no edema.  Lymphadenopathy:    She has no cervical adenopathy.  Neurological: She is alert and oriented to person, place, and time. She has normal reflexes. No cranial nerve deficit. Gait normal.  Skin: Skin is warm and dry.  Psychiatric: She has a normal mood and affect.  Nursing note and vitals reviewed.   ASSESSMENT and PLAN  1. Annual physical exam No concerns per history or exam. Routine HCM labs ordered. HCM reviewed/discussed. Anticipatory guidance regarding healthy weight, lifestyle and choices given.   2. Pre-diabetes - Hemoglobin A1c  3. Cold induced bronchospasm - albuterol (PROVENTIL HFA;VENTOLIN HFA) 108 (90 Base) MCG/ACT inhaler; Inhale 1-2 puffs into the lungs every 4 (four)  hours as needed for wheezing or shortness of breath.  4. Anxiety state - LORazepam (ATIVAN) 1 MG tablet; Take 1 tablet (1 mg total) by mouth every 8 (eight) hours as needed for anxiety.  5. Visit for screening mammogram  6. Screening for deficiency anemia - CBC  7. Screening for lipoid disorders - Lipid panel  8. Screening for endocrine, metabolic and immunity disorder - Comprehensive metabolic panel - TSH  Other orders Return in 1 year (on 11/03/2019).    Rutherford Guys, MD [facility] [place], [place] [address] Ph.  [phone] Fax [phone]

## 2018-11-02 NOTE — Patient Instructions (Addendum)
   If you have lab work done today you will be contacted with your lab results within the next 2 weeks.  If you have not heard from us then please contact us. The fastest way to get your results is to register for My Chart.   IF you received an x-ray today, you will receive an invoice from [facility]. Please contact [facility] at [phone] with questions or concerns regarding your invoice.   IF you received labwork today, you will receive an invoice from [facility]. Please contact [facility] at [phone] with questions or concerns regarding your invoice.   Our billing staff will not be able to assist you with questions regarding bills from these companies.  You will be contacted with the lab results as soon as they are available. The fastest way to get your results is to activate your My Chart account. Instructions are located on the last page of this paperwork. If you have not heard from us regarding the results in 2 weeks, please contact this office.     Health Maintenance, Female Adopting a healthy lifestyle and getting preventive care can go a long way to promote health and wellness. Talk with your health care provider about what schedule of regular examinations is right for you. This is a good chance for you to check in with your provider about disease prevention and staying healthy. In between checkups, there are plenty of things you can do on your own. Experts have done a lot of research about which lifestyle changes and preventive measures are most likely to keep you healthy. Ask your health care provider for more information. Weight and diet Eat a healthy diet  Be sure to include plenty of vegetables, fruits, low-fat dairy products, and lean protein.  Do not eat a lot of foods high in solid fats, added sugars, or salt.  Get regular exercise. This is one of the most important things you can do for your health. ? Most adults should exercise for at least 150  minutes each week. The exercise should increase your heart rate and make you sweat (moderate-intensity exercise). ? Most adults should also do strengthening exercises at least twice a week. This is in addition to the moderate-intensity exercise.  Maintain a healthy weight  Body mass index (BMI) is a measurement that can be used to identify possible weight problems. It estimates body fat based on height and weight. Your health care provider can help determine your BMI and help you achieve or maintain a healthy weight.  For females 20 years of age and older: ? A BMI below 18.5 is considered underweight. ? A BMI of 18.5 to 24.9 is normal. ? A BMI of 25 to 29.9 is considered overweight. ? A BMI of 30 and above is considered obese.  Watch levels of cholesterol and blood lipids  You should start having your blood tested for lipids and cholesterol at 46 years of age, then have this test every 5 years.  You may need to have your cholesterol levels checked more often if: ? Your lipid or cholesterol levels are high. ? You are older than 46 years of age. ? You are at high risk for heart disease.  Cancer screening Lung Cancer  Lung cancer screening is recommended for adults 55-80 years old who are at high risk for lung cancer because of a history of smoking.  A yearly low-dose CT scan of the lungs is recommended for people who: ? Currently smoke. ? Have quit   within the past 15 years. ? Have at least a 30-pack-year history of smoking. A pack year is smoking an average of one pack of cigarettes a day for 1 year.  Yearly screening should continue until it has been 15 years since you quit.  Yearly screening should stop if you develop a health problem that would prevent you from having lung cancer treatment.  Breast Cancer  Practice breast self-awareness. This means understanding how your breasts normally appear and feel.  It also means doing regular breast self-exams. Let your health care  provider know about any changes, no matter how small.  If you are in your 20s or 30s, you should have a clinical breast exam (CBE) by a health care provider every 1-3 years as part of a regular health exam.  If you are 40 or older, have a CBE every year. Also consider having a breast X-ray (mammogram) every year.  If you have a family history of breast cancer, talk to your health care provider about genetic screening.  If you are at high risk for breast cancer, talk to your health care provider about having an MRI and a mammogram every year.  Breast cancer gene (BRCA) assessment is recommended for women who have family members with BRCA-related cancers. BRCA-related cancers include: ? Breast. ? Ovarian. ? Tubal. ? Peritoneal cancers.  Results of the assessment will determine the need for genetic counseling and BRCA1 and BRCA2 testing.  Cervical Cancer Your health care provider may recommend that you be screened regularly for cancer of the pelvic organs (ovaries, uterus, and vagina). This screening involves a pelvic examination, including checking for microscopic changes to the surface of your cervix (Pap test). You may be encouraged to have this screening done every 3 years, beginning at age 21.  For women ages 30-65, health care providers may recommend pelvic exams and Pap testing every 3 years, or they may recommend the Pap and pelvic exam, combined with testing for human papilloma virus (HPV), every 5 years. Some types of HPV increase your risk of cervical cancer. Testing for HPV may also be done on women of any age with unclear Pap test results.  Other health care providers may not recommend any screening for nonpregnant women who are considered low risk for pelvic cancer and who do not have symptoms. Ask your health care provider if a screening pelvic exam is right for you.  If you have had past treatment for cervical cancer or a condition that could lead to cancer, you need Pap tests  and screening for cancer for at least 20 years after your treatment. If Pap tests have been discontinued, your risk factors (such as having a new sexual partner) need to be reassessed to determine if screening should resume. Some women have medical problems that increase the chance of getting cervical cancer. In these cases, your health care provider may recommend more frequent screening and Pap tests.  Colorectal Cancer  This type of cancer can be detected and often prevented.  Routine colorectal cancer screening usually begins at 46 years of age and continues through 46 years of age.  Your health care provider may recommend screening at an earlier age if you have risk factors for colon cancer.  Your health care provider may also recommend using home test kits to check for hidden blood in the stool.  A small camera at the end of a tube can be used to examine your colon directly (sigmoidoscopy or colonoscopy). This is done to check   for the earliest forms of colorectal cancer.  Routine screening usually begins at age 50.  Direct examination of the colon should be repeated every 5-10 years through 46 years of age. However, you may need to be screened more often if early forms of precancerous polyps or small growths are found.  Skin Cancer  Check your skin from head to toe regularly.  Tell your health care provider about any new moles or changes in moles, especially if there is a change in a mole's shape or color.  Also tell your health care provider if you have a mole that is larger than the size of a pencil eraser.  Always use sunscreen. Apply sunscreen liberally and repeatedly throughout the day.  Protect yourself by wearing long sleeves, pants, a wide-brimmed hat, and sunglasses whenever you are outside.  Heart disease, diabetes, and high blood pressure  High blood pressure causes heart disease and increases the risk of stroke. High blood pressure is more likely to develop  in: ? People who have blood pressure in the high end of the normal range (130-139/85-89 mm Hg). ? People who are overweight or obese. ? People who are African American.  If you are 18-39 years of age, have your blood pressure checked every 3-5 years. If you are 40 years of age or older, have your blood pressure checked every year. You should have your blood pressure measured twice-once when you are at a hospital or clinic, and once when you are not at a hospital or clinic. Record the average of the two measurements. To check your blood pressure when you are not at a hospital or clinic, you can use: ? An automated blood pressure machine at a pharmacy. ? A home blood pressure monitor.  If you are between 55 years and 79 years old, ask your health care provider if you should take aspirin to prevent strokes.  Have regular diabetes screenings. This involves taking a blood sample to check your fasting blood sugar level. ? If you are at a normal weight and have a low risk for diabetes, have this test once every three years after 45 years of age. ? If you are overweight and have a high risk for diabetes, consider being tested at a younger age or more often. Preventing infection Hepatitis B  If you have a higher risk for hepatitis B, you should be screened for this virus. You are considered at high risk for hepatitis B if: ? You were born in a country where hepatitis B is common. Ask your health care provider which countries are considered high risk. ? Your parents were born in a high-risk country, and you have not been immunized against hepatitis B (hepatitis B vaccine). ? You have HIV or AIDS. ? You use needles to inject street drugs. ? You live with someone who has hepatitis B. ? You have had sex with someone who has hepatitis B. ? You get hemodialysis treatment. ? You take certain medicines for conditions, including cancer, organ transplantation, and autoimmune conditions.  Hepatitis C  Blood  testing is recommended for: ? Everyone born from 1945 through 1965. ? Anyone with known risk factors for hepatitis C.  Sexually transmitted infections (STIs)  You should be screened for sexually transmitted infections (STIs) including gonorrhea and chlamydia if: ? You are sexually active and are younger than 46 years of age. ? You are older than 46 years of age and your health care provider tells you that you are at risk for   this type of infection. ? Your sexual activity has changed since you were last screened and you are at an increased risk for chlamydia or gonorrhea. Ask your health care provider if you are at risk.  If you do not have HIV, but are at risk, it may be recommended that you take a prescription medicine daily to prevent HIV infection. This is called pre-exposure prophylaxis (PrEP). You are considered at risk if: ? You are sexually active and do not regularly use condoms or know the HIV status of your partner(s). ? You take drugs by injection. ? You are sexually active with a partner who has HIV.  Talk with your health care provider about whether you are at high risk of being infected with HIV. If you choose to begin PrEP, you should first be tested for HIV. You should then be tested every 3 months for as long as you are taking PrEP. Pregnancy  If you are premenopausal and you may become pregnant, ask your health care provider about preconception counseling.  If you may become pregnant, take 400 to 800 micrograms (mcg) of folic acid every day.  If you want to prevent pregnancy, talk to your health care provider about birth control (contraception). Osteoporosis and menopause  Osteoporosis is a disease in which the bones lose minerals and strength with aging. This can result in serious bone fractures. Your risk for osteoporosis can be identified using a bone density scan.  If you are 65 years of age or older, or if you are at risk for osteoporosis and fractures, ask your  health care provider if you should be screened.  Ask your health care provider whether you should take a calcium or vitamin D supplement to lower your risk for osteoporosis.  Menopause may have certain physical symptoms and risks.  Hormone replacement therapy may reduce some of these symptoms and risks. Talk to your health care provider about whether hormone replacement therapy is right for you. Follow these instructions at home:  Schedule regular health, dental, and eye exams.  Stay current with your immunizations.  Do not use any tobacco products including cigarettes, chewing tobacco, or electronic cigarettes.  If you are pregnant, do not drink alcohol.  If you are breastfeeding, limit how much and how often you drink alcohol.  Limit alcohol intake to no more than 1 drink per day for nonpregnant women. One drink equals 12 ounces of beer, 5 ounces of wine, or 1 ounces of hard liquor.  Do not use street drugs.  Do not share needles.  Ask your health care provider for help if you need support or information about quitting drugs.  Tell your health care provider if you often feel depressed.  Tell your health care provider if you have ever been abused or do not feel safe at home. This information is not intended to replace advice given to you by your health care provider. Make sure you discuss any questions you have with your health care provider. Document Released: 07/01/2011 Document Revised: 05/23/2016 Document Reviewed: 09/19/2015 Elsevier Interactive Patient Education  2018 [employer] Inc[employer]  

## 2018-11-03 LAB — COMPREHENSIVE METABOLIC PANEL
ALT: 15 IU/L (ref 0–32)
AST: 18 IU/L (ref 0–40)
Albumin/Globulin Ratio: 1.7 (ref 1.2–2.2)
Albumin: 4.7 g/dL (ref 3.5–5.5)
Alkaline Phosphatase: 88 IU/L (ref 39–117)
BUN/Creatinine Ratio: 16 (ref 9–23)
BUN: 14 mg/dL (ref 6–24)
Bilirubin Total: <0.2 mg/dL (ref 0.0–1.2)
CO2: 21 mmol/L (ref 20–29)
Calcium: 9.4 mg/dL (ref 8.7–10.2)
Chloride: 99 mmol/L (ref 96–106)
Creatinine, Ser: 0.85 mg/dL (ref 0.57–1.00)
GFR calc Af Amer: 95 mL/min/{1.73_m2} (ref >59)
GFR calc non Af Amer: 82 mL/min/{1.73_m2} (ref >59)
Globulin, Total: 2.8 g/dL (ref 1.5–4.5)
Glucose: 85 mg/dL (ref 65–99)
Potassium: 4.4 mmol/L (ref 3.5–5.2)
Sodium: 137 mmol/L (ref 134–144)
Total Protein: 7.5 g/dL (ref 6.0–8.5)

## 2018-11-03 LAB — CBC
Hematocrit: 40.2 % (ref 34.0–46.6)
Hemoglobin: 13.2 g/dL (ref 11.1–15.9)
MCH: 28.5 pg (ref 26.6–33.0)
MCHC: 32.8 g/dL (ref 31.5–35.7)
MCV: 87 fL (ref 79–97)
Platelets: 442 10*3/uL (ref 150–450)
RBC: 4.63 x10E6/uL (ref 3.77–5.28)
RDW: 12.6 % (ref 12.3–15.4)
WBC: 12.7 10*3/uL — ABNORMAL HIGH (ref 3.4–10.8)

## 2018-11-03 LAB — LIPID PANEL
Chol/HDL Ratio: 5.1 ratio — ABNORMAL HIGH (ref 0.0–4.4)
Cholesterol, Total: 225 mg/dL — ABNORMAL HIGH (ref 100–199)
HDL: 44 mg/dL (ref >39)
LDL Calculated: 157 mg/dL — ABNORMAL HIGH (ref 0–99)
Triglycerides: 119 mg/dL (ref 0–149)
VLDL Cholesterol Cal: 24 mg/dL (ref 5–40)

## 2018-11-03 LAB — HEMOGLOBIN A1C
Est. average glucose Bld gHb Est-mCnc: 114 mg/dL
Hgb A1c MFr Bld: 5.6 % (ref 4.8–5.6)

## 2018-11-03 LAB — TSH: TSH: 2.66 u[IU]/mL (ref 0.450–4.500)

## 2019-04-26 ENCOUNTER — Telehealth (INDEPENDENT_AMBULATORY_CARE_PROVIDER_SITE_OTHER): Payer: BLUE CROSS/BLUE SHIELD | Admitting: Family Medicine

## 2019-04-26 ENCOUNTER — Ambulatory Visit: Payer: Self-pay | Admitting: *Deleted

## 2019-04-26 ENCOUNTER — Other Ambulatory Visit: Payer: Self-pay

## 2019-04-26 NOTE — Telephone Encounter (Signed)
Patient  At different has been experiencing intermittent tingling sensation in both arms and legs, not all at the same time, seems random and moves around.Has been occurring since April 1st and has progress over the last few days.  Left arm and hand seem to be worse. Yesterday, the left arm and hand tingling intensified and last approximately 2 minutes to the point she could not use her left hand, briefly.Stated the tingling has lasted upwards 5 hours at a time.  Denies CP/SOB/Fever/vision changes/changes in speech. No swelling noted. No unsteadiness/dizziness reported. Reports she has a familial hx of strokes. Also reports she is very clumsy/falls alot but can't pinpoint a particular accident that might have caused some damage.  Routing/warm transferring  to PCP for appointment. Reason for Disposition . [1] Numbness or tingling on both sides of body AND [2] is a new symptom present > 24 hours  Answer Assessment - Initial Assessment Questions 1. SYMPTOM: "What is the main symptom you are concerned about?" (e.g., weakness, numbness)      2. ONSET: "When did this start?" (minutes, hours, days; while sleeping)     April 1st these symptoms started and have progressed. 3. LAST NORMAL: "When was the last time you were normal (no symptoms)?"     End of March. 4. PATTERN "Does this come and go, or has it been constant since it started?"  "Is it present now?"     Comes and goes. 5. CARDIAC SYMPTOMS: "Have you had any of the following symptoms: chest pain, difficulty breathing, palpitations?"     no 6. NEUROLOGIC SYMPTOMS: "Have you had any of the following symptoms: headache, dizziness, vision loss, double vision, changes in speech, unsteady on your feet?"     no 7. OTHER SYMPTOMS: "Do you have any other symptoms?"     Both arms and both feet with the tingling. Top of right leg just above the knee.  8. PREGNANCY: "Is there any chance you are pregnant?" "When was your last menstrual period?"      no  Protocols used: NEUROLOGIC DEFICIT-A-AH

## 2019-04-26 NOTE — Progress Notes (Signed)
Virtual Visit via Telephone Note Call placed [phone], then [phone]- left message - call back for visit today or reschedule for tomorrow.    I connected with Christina Mora on 04/26/19 at 4:42 PM by telephone and verified that I am speaking with the correct person using two identifiers.   I discussed the limitations, risks, security and privacy concerns of performing an evaluation and management service by telephone and the availability of in person appointments. I also discussed with the patient that there may be a patient responsible charge related to this service. The patient expressed understanding and agreed to proceed, consent obtained  Chief complaint:   History of Present Illness:    Patient Active Problem List   Diagnosis Date Noted   Need for influenza vaccination 10/29/2017   Anxiety state 10/29/2017   Status post total abdominal hysterectomy 09/24/2016   Obesity, unspecified 05/17/2014   Chronic allergic rhinitis 05/17/2014   Past Medical History:  Diagnosis Date   Allergy    Loratadine PRN   Anemia    Anovulatory    10+ years   Anxiety    Elevated hemoglobin A1c 2017   Elevated WBC count 2017   Fibroids    Pre-diabetes    SVD (spontaneous vaginal delivery)    x 1   Past Surgical History:  Procedure Laterality Date   ABDOMINAL HYSTERECTOMY  09/24/2016   DILATION AND CURETTAGE OF UTERUS     x 4, 3 for heavy bleeding, 1 for MAB   EYE SURGERY Bilateral    as a child at age 30 or 4   FRACTURE SURGERY     R femur fracture age 75.   HYSTERECTOMY ABDOMINAL WITH SALPINGECTOMY Bilateral 09/24/2016   Procedure: HYSTERECTOMY ABDOMINAL WITH Bilateral SALPINGECTOMY with Lysis of Adhesions;  Surgeon: Nunzio Cobbs, MD;  Location: [facility];  Service: Gynecology;  Laterality: Bilateral;  3 hours   Allergies  Allergen Reactions   Robaxin [Methocarbamol] Nausea And Vomiting   Prior to Admission medications   Medication Sig Start Date End Date Taking? Authorizing  Provider  albuterol (PROVENTIL HFA;VENTOLIN HFA) 108 (90 Base) MCG/ACT inhaler Inhale 1-2 puffs into the lungs every 4 (four) hours as needed for wheezing or shortness of breath. 11/02/18   Rutherford Guys, MD  Ascorbic Acid (VITAMIN C PO) Take 1 tablet by mouth daily.     [provider]  LORazepam (ATIVAN) 1 MG tablet Take 1 tablet (1 mg total) by mouth every 8 (eight) hours as needed for anxiety. 11/02/18   Rutherford Guys, MD  Triamcinolone Acetonide (TRIAMCINOLONE 0.1 % CREAM : EUCERIN) CREA Apply 1 application topically 2 (two) times daily as needed.    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  Social Needs   Emergency planning/management officer strain: Not on file   Food insecurity:    Worry: Not on file    Inability: Not on file   Transportation needs:    Medical: Not on file    Non-medical: Not on file  Tobacco Use   Smoking status: Never Smoker   Smokeless tobacco: Never Used  Substance and Sexual Activity   Alcohol use:  Yes    Alcohol/week: 0.0 standard drinks    Comment: occasional wine    Drug use: No   Sexual activity: Yes    Partners: Female    Birth control/protection: None    Comment: female partner  Lifestyle   Physical activity:    Days per week: Not on file    Minutes per session: Not on file   Stress: Not on file  Relationships   Social connections:    Talks on phone: Not on file    Gets together: Not on file    Attends religious service: Not on file    Active member of club or organization: Not on file    Attends meetings of clubs or organizations: Not on file    Relationship status: Not on file   Intimate partner violence:    Fear of current or ex partner: Not on file     Emotionally abused: Not on file    Physically abused: Not on file    Forced sexual activity: Not on file  Other Topics Concern   Not on file  Social History Narrative   Marital status: married x 19 years; never happy      Children:  5 children (1 biological son, 1 stepchild; 3 adopted foster children); no grandchildren      Lives: husband, 47 yo, 62 yo, 100 yo; 2 in college      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     Observations/Objective:   Assessment and Plan: No diagnosis found.   Follow Up Instructions:    I discussed the assessment and treatment plan with the patient. The patient was provided an opportunity to ask questions and all were answered. The patient agreed with the plan and demonstrated an understanding of the instructions.   The patient was advised to call back or seek an in-person evaluation if the symptoms worsen or if the condition fails to improve as anticipated.  I provided  minutes of non-face-to-face time during this encounter.  Signed,   Merri Ray, MD [facility].  04/26/19

## 2019-04-27 ENCOUNTER — Telehealth (INDEPENDENT_AMBULATORY_CARE_PROVIDER_SITE_OTHER): Payer: BLUE CROSS/BLUE SHIELD | Admitting: Family Medicine

## 2019-04-27 VITALS — BP 133/80 | HR 94

## 2019-04-27 DIAGNOSIS — Z9181 History of falling: Secondary | ICD-10-CM

## 2019-04-27 DIAGNOSIS — Z131 Encounter for screening for diabetes mellitus: Secondary | ICD-10-CM

## 2019-04-27 DIAGNOSIS — R202 Paresthesia of skin: Secondary | ICD-10-CM | POA: Diagnosis not present

## 2019-04-27 DIAGNOSIS — R200 Anesthesia of skin: Secondary | ICD-10-CM

## 2019-04-27 DIAGNOSIS — Z1329 Encounter for screening for other suspected endocrine disorder: Secondary | ICD-10-CM

## 2019-04-27 DIAGNOSIS — R29898 Other symptoms and signs involving the musculoskeletal system: Secondary | ICD-10-CM | POA: Diagnosis not present

## 2019-04-27 DIAGNOSIS — M792 Neuralgia and neuritis, unspecified: Secondary | ICD-10-CM | POA: Diagnosis not present

## 2019-04-27 DIAGNOSIS — Z130 Encounter for screening for diseases of the blood and blood-forming organs and certain disorders involving the immune mechanism: Secondary | ICD-10-CM | POA: Diagnosis not present

## 2019-04-27 MED ORDER — GABAPENTIN 300 MG PO CAPS: 300.0000 mg | ORAL_CAPSULE | Freq: Two times a day (BID) | ORAL | 0 refills | Status: DC

## 2019-04-27 NOTE — Progress Notes (Signed)
Virtual Visit via Video Note  I connected with Christina Mora on 04/27/19 at 3:07 PM by a video enabled telemedicine application and verified that I am speaking with the correct person using two identifiers.   I discussed the limitations, risks, security and privacy concerns of performing an evaluation and management service by telephone and the availability of in person appointments. I also discussed with the patient that there may be a patient responsible charge related to this service. The patient expressed understanding and agreed to proceed, consent obtained  Chief complaint:  Tingling arms and legs.    History of Present Illness: Christina Mora is a 48 y.o. female  Starting 1 month ago - noticed numbness in both arms when sleeping - appeared to be related to which side she was sleeping on.  Over past 4-5 days noticed numbness sensation in legs - intermittent when sitting,lying down, sometimes when standing.  Pins and needles/tingling feeling. Goes in waves, up and down arm - starts in one area. Starts in left arm usually.  Symptoms so bad 2 days ago - so intense, that hand was paralyzed for about 90 seconds. Could move elbow, but could not move hand.  After about 90 seconds, ability to move returned. Episode occurred again once more that day. Symptoms appear to be getting worse these past 5 days. Painful feeling - icy hot feeling to pain. Worst symptoms 2 days ago - debilitating.  Numbness in different areas, but primarily left side, left toes, left heel, left arm and hand. 70% left. 30% right side. Only on right at this time. Arm and leg.  Slight sweating with pain - sweating on both sides.  No tactile hallucinations. Nonsmoker. Diagnosed with cerebral palsy as a young child, had therapy as a child and cannot tell. Has had falls 1-2 times per month for her entire life. ?depth perception issues.  Not followed by neuro recently. No recent change in falling.  No new headaches.  No slurred  speech, focal weakness. No urinary/stool incontinence.  No neck pain or stiffness.  No back pain No fever.  No known injury. No injuries in months.    Recent increased stressors.  Mom died March 31, 2023 with PNA, prior debilitaing stroke in February, 3 businesses - one failed, trying to save other two.  Feels like she manages stress well. Has ativan available, but not taken  Mom had history of TIA's, and 3 CVA.   Results for orders placed or performed in visit on 11/02/18  CBC  Result Value Ref Range   WBC 12.7 (H) 3.4 - 10.8 x10E3/uL   RBC 4.63 3.77 - 5.28 x10E6/uL   Hemoglobin 13.2 11.1 - 15.9 g/dL   Hematocrit 40.2 34.0 - 46.6 %   MCV 87 79 - 97 fL   MCH 28.5 26.6 - 33.0 pg   MCHC 32.8 31.5 - 35.7 g/dL   RDW 12.6 12.3 - 15.4 %   Platelets 442 150 - 450 x10E3/uL  Comprehensive metabolic panel  Result Value Ref Range   Glucose 85 65 - 99 mg/dL   BUN 14 6 - 24 mg/dL   Creatinine, Ser 0.85 0.57 - 1.00 mg/dL   GFR calc non Af Amer 82 >59 mL/min/1.73   GFR calc Af Amer 95 >59 mL/min/1.73   BUN/Creatinine Ratio 16 9 - 23   Sodium 137 134 - 144 mmol/L   Potassium 4.4 3.5 - 5.2 mmol/L   Chloride 99 96 - 106 mmol/L   CO2 21 20 -  29 mmol/L   Calcium 9.4 8.7 - 10.2 mg/dL   Total Protein 7.5 6.0 - 8.5 g/dL   Albumin 4.7 3.5 - 5.5 g/dL   Globulin, Total 2.8 1.5 - 4.5 g/dL   Albumin/Globulin Ratio 1.7 1.2 - 2.2   Bilirubin Total <0.2 0.0 - 1.2 mg/dL   Alkaline Phosphatase 88 39 - 117 IU/L   AST 18 0 - 40 IU/L   ALT 15 0 - 32 IU/L  Lipid panel  Result Value Ref Range   Cholesterol, Total 225 (H) 100 - 199 mg/dL   Triglycerides 119 0 - 149 mg/dL   HDL 44 >39 mg/dL   VLDL Cholesterol Cal 24 5 - 40 mg/dL   LDL Calculated 157 (H) 0 - 99 mg/dL   Chol/HDL Ratio 5.1 (H) 0.0 - 4.4 ratio  TSH  Result Value Ref Range   TSH 2.660 0.450 - 4.500 uIU/mL  Hemoglobin A1c  Result Value Ref Range   Hgb A1c MFr Bld 5.6 4.8 - 5.6 %   Est. average glucose Bld gHb Est-mCnc 114 mg/dL       Patient Active Problem List   Diagnosis Date Noted  . Need for influenza vaccination 10/29/2017  . Anxiety state 10/29/2017  . Status post total abdominal hysterectomy 09/24/2016  . Obesity, unspecified 05/17/2014  . Chronic allergic rhinitis 05/17/2014   Past Medical History:  Diagnosis Date  . Allergy    Loratadine PRN  . Anemia   . Anovulatory    10+ years  . Anxiety   . Elevated hemoglobin A1c 2017  . Elevated WBC count 2017  . Fibroids   . Pre-diabetes   . SVD (spontaneous vaginal delivery)    x 1   Past Surgical History:  Procedure Laterality Date  . ABDOMINAL HYSTERECTOMY  09/24/2016  . DILATION AND CURETTAGE OF UTERUS     x 4, 3 for heavy bleeding, 1 for MAB  . EYE SURGERY Bilateral    as a child at age 48 or 4  . FRACTURE SURGERY     R femur fracture age 65.  . HYSTERECTOMY ABDOMINAL WITH SALPINGECTOMY Bilateral 09/24/2016   Procedure: HYSTERECTOMY ABDOMINAL WITH Bilateral SALPINGECTOMY with Lysis of Adhesions;  Surgeon: Nunzio Cobbs, MD;  Location: [facility];  Service: Gynecology;  Laterality: Bilateral;  3 hours   Allergies  Allergen Reactions  . Robaxin [Methocarbamol] Nausea And Vomiting   Prior to Admission medications   Medication Sig Start Date End Date Taking? Authorizing Provider  albuterol (PROVENTIL HFA;VENTOLIN HFA) 108 (90 Base) MCG/ACT inhaler Inhale 1-2 puffs into the lungs every 4 (four) hours as needed for wheezing or shortness of breath. 11/02/18  Yes Rutherford Guys, MD  Triamcinolone Acetonide (TRIAMCINOLONE 0.1 % CREAM : EUCERIN) CREA Apply 1 application topically 2 (two) times daily as needed.   Yes [provider]   Social History   Socioeconomic History  . Marital status: Legally Separated    Spouse name: Not on file  . Number of children: Not on file  . Years of education: Not on file  . Highest education level: Not on file  Occupational History  . 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 Financial resource strain: Not on file  . Food insecurity:    Worry: Not on file    Inability: Not on file  . Transportation needs:    Medical: Not on file    Non-medical: Not on file  Tobacco Use  . Smoking status: Never Smoker  . Smokeless tobacco: Never Used  Substance and Sexual Activity  . Alcohol use: Yes    Alcohol/week: 0.0 standard drinks    Comment: occasional wine   . Drug use: No  . Sexual activity: Yes    Partners: Female    Birth control/protection: None    Comment: female partner  Lifestyle  . Physical activity:    Days per week: Not on file    Minutes per session: Not on file  . Stress: Not on file  Relationships  . Social connections:    Talks on phone: Not on file    Gets together: Not on file    Attends religious service: Not on file    Active member of club or organization: Not on file    Attends meetings of clubs or organizations: Not on file    Relationship status: Not on file  . Intimate partner violence:    Fear of current or ex partner: Not on file    Emotionally abused: Not on file    Physically abused: Not on file    Forced sexual activity: Not on file  Other Topics Concern  . 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    Observations/Objective: No distress on video, normal speech.  Normal responses.  Euthymic mood.  Normal facial movements, no droop, no apparent deficits over video  Results for orders placed or performed in visit on 04/27/19  CBC  Result Value Ref Range   WBC 12.0 (H) 3.4 -  10.8 x10E3/uL   RBC 4.33 3.77 - 5.28 x10E6/uL   Hemoglobin 12.5 11.1 - 15.9 g/dL   Hematocrit 37.9 34.0 - 46.6 %   MCV 88 79 - 97 fL   MCH 28.9 26.6 - 33.0 pg   MCHC 33.0 31.5 - 35.7 g/dL   RDW 12.4 11.7 - 15.4 %   Platelets 405 150 - 450 x10E3/uL  TSH  Result Value Ref Range   TSH 1.820 0.450 - 4.500 uIU/mL  Magnesium  Result Value Ref Range   Magnesium 2.2 1.6 - 2.3 mg/dL  Comprehensive metabolic panel  Result Value Ref Range   Glucose 108 (H) 65 - 99 mg/dL   BUN 14 6 - 24 mg/dL   Creatinine, Ser 1.01 (H) 0.57 - 1.00 mg/dL   GFR calc non Af Amer 67 >59 mL/min/1.73   GFR calc Af Amer 77 >59 mL/min/1.73   BUN/Creatinine Ratio 14 9 - 23   Sodium 141 134 - 144 mmol/L   Potassium 4.1 3.5 - 5.2 mmol/L   Chloride 104 96 - 106 mmol/L   CO2 19 (L) 20 - 29 mmol/L   Calcium 9.1 8.7 - 10.2 mg/dL   Total Protein 7.0 6.0 - 8.5 g/dL   Albumin 4.3 3.8 - 4.8 g/dL   Globulin, Total  2.7 1.5 - 4.5 g/dL   Albumin/Globulin Ratio 1.6 1.2 - 2.2   Bilirubin Total <0.2 0.0 - 1.2 mg/dL   Alkaline Phosphatase 90 39 - 117 IU/L   AST 15 0 - 40 IU/L   ALT 12 0 - 32 IU/L    Assessment and Plan: Numbness and tingling of both lower extremities - Plan: CBC, TSH, Magnesium, gabapentin (NEURONTIN) 300 MG capsule, Comprehensive metabolic panel, Ambulatory referral to Neurology  Numbness and tingling of both upper extremities - Plan: CBC, TSH, Magnesium, gabapentin (NEURONTIN) 300 MG capsule, Comprehensive metabolic panel, Ambulatory referral to Neurology  Left hand weakness - Plan: CBC, TSH, Magnesium, gabapentin (NEURONTIN) 300 MG capsule, Comprehensive metabolic panel, Ambulatory referral to Neurology  History of falling - Plan: Ambulatory referral to Neurology  Nerve pain - Plan: TSH, gabapentin (NEURONTIN) 300 MG capsule, Ambulatory referral to Neurology  Screening, anemia, deficiency, iron - Plan: CBC  Screening for thyroid disorder - Plan: TSH  Screening for diabetes mellitus - Plan:  Comprehensive metabolic panel  Primarily left-sided dysesthesias to the point of inability to use his hand a few days ago when symptoms were worse.  Left greater than right but has had bilateral symptoms.  Initial arm symptoms could have been more neuronal impingement/cervical in nature, but now with lower extremity symptoms as well.  No headache, no fever, no visual symptoms, no bowel/bladder incontinence.   -Blood work above with reassuring TSH, magnesium, overall electrolytes reassuring with only mild hyperglycemia, and borderline leukocytosis without fever or infection symptoms.  -Less likely MS without bowel or bladder symptoms, visual symptoms, but will have urgent referral/evaluation with neurology on April 30 to determine further course/eval.  -I did start her on gabapentin 300 mg twice daily, nightly initially to make sure tolerated.  Potential side effects discussed, RTC and ER precautions given.  Follow Up Instructions: Patient Instructions       If you have lab work done today you will be contacted with your lab results within the next 2 weeks.  If you have not heard from [place] then please contact us. The fastest way to get your results is to register for My Chart.   IF you received an x-ray today, you will receive an invoice from [facility]. Please contact [facility] at [phone] with questions or concerns regarding your invoice.   IF you received labwork today, you will receive an invoice from [facility]. Please contact [facility] at [phone] with questions or concerns regarding your invoice.   Our billing staff will not be able to assist you with questions regarding bills from these companies.  You will be contacted with the lab results as soon as they are available. The fastest way to get your results is to activate your My Chart account. Instructions are located on the last page of this paperwork. If you have not heard from [place] regarding the results in 2  weeks, please contact this office.          I discussed the assessment and treatment plan with the patient. The patient was provided an opportunity to ask questions and all were answered. The patient agreed with the plan and demonstrated an understanding of the instructions.   The patient was advised to call back or seek an in-person evaluation if the symptoms worsen or if the condition fails to improve as anticipated.  I provided 26 minutes of non-face-to-face time during this encounter.   Wendie Agreste, MD

## 2019-04-27 NOTE — Progress Notes (Signed)
CC- tingling in both arms and legs- started last week of March but worse in the last 2 days. Hands could not move. Feelings come back slow. Could not move finger an hand. This come in waves. Pins and needles. Knows a lot about a stroke and do not think its a stroke. Feels like nerves is doing some crazy things. Paralysis at times feel is getting worse and scared to the point need to get this checked out. Was told need to go to Er and patient refused to go to the ER and just want to be seen by a specialist. Would like to have a referral.

## 2019-04-27 NOTE — Patient Instructions (Signed)
° ° ° °  If you have lab work done today you will be contacted with your lab results within the next 2 weeks.  If you have not heard from us then please contact us. The fastest way to get your results is to register for My Chart. ° ° °IF you received an x-ray today, you will receive an invoice from [facility]. Please contact [facility] at [phone] with questions or concerns regarding your invoice.  ° °IF you received labwork today, you will receive an invoice from [facility]. Please contact [facility] at [phone] with questions or concerns regarding your invoice.  ° °Our billing staff will not be able to assist you with questions regarding bills from these companies. ° °You will be contacted with the lab results as soon as they are available. The fastest way to get your results is to activate your My Chart account. Instructions are located on the last page of this paperwork. If you have not heard from us regarding the results in 2 weeks, please contact this office. °  ° ° ° °

## 2019-04-28 ENCOUNTER — Telehealth: Payer: Self-pay

## 2019-04-28 LAB — COMPREHENSIVE METABOLIC PANEL
ALT: 12 IU/L (ref 0–32)
AST: 15 IU/L (ref 0–40)
Albumin/Globulin Ratio: 1.6 (ref 1.2–2.2)
Albumin: 4.3 g/dL (ref 3.8–4.8)
Alkaline Phosphatase: 90 IU/L (ref 39–117)
BUN/Creatinine Ratio: 14 (ref 9–23)
BUN: 14 mg/dL (ref 6–24)
Bilirubin Total: <0.2 mg/dL (ref 0.0–1.2)
CO2: 19 mmol/L — ABNORMAL LOW (ref 20–29)
Calcium: 9.1 mg/dL (ref 8.7–10.2)
Chloride: 104 mmol/L (ref 96–106)
Creatinine, Ser: 1.01 mg/dL — ABNORMAL HIGH (ref 0.57–1.00)
GFR calc Af Amer: 77 mL/min/{1.73_m2} (ref >59)
GFR calc non Af Amer: 67 mL/min/{1.73_m2} (ref >59)
Globulin, Total: 2.7 g/dL (ref 1.5–4.5)
Glucose: 108 mg/dL — ABNORMAL HIGH (ref 65–99)
Potassium: 4.1 mmol/L (ref 3.5–5.2)
Sodium: 141 mmol/L (ref 134–144)
Total Protein: 7 g/dL (ref 6.0–8.5)

## 2019-04-28 LAB — CBC
Hematocrit: 37.9 % (ref 34.0–46.6)
Hemoglobin: 12.5 g/dL (ref 11.1–15.9)
MCH: 28.9 pg (ref 26.6–33.0)
MCHC: 33 g/dL (ref 31.5–35.7)
MCV: 88 fL (ref 79–97)
Platelets: 405 10*3/uL (ref 150–450)
RBC: 4.33 x10E6/uL (ref 3.77–5.28)
RDW: 12.4 % (ref 11.7–15.4)
WBC: 12 10*3/uL — ABNORMAL HIGH (ref 3.4–10.8)

## 2019-04-28 LAB — TSH: TSH: 1.82 u[IU]/mL (ref 0.450–4.500)

## 2019-04-28 LAB — MAGNESIUM: Magnesium: 2.2 mg/dL (ref 1.6–2.3)

## 2019-04-28 NOTE — Telephone Encounter (Signed)
E-mail has been provided to the pt.

## 2019-04-28 NOTE — Telephone Encounter (Signed)
I called pt. Pt's meds, allergies, and PMH were updated.  Pt understands that although there may be some limitations with this type of visit, we will take all precautions to reduce any security or privacy concerns.  Pt understands that this will be treated like an in office visit and we will file with pt's insurance, and there may be a patient responsible charge related to this service.  Pt's email is [email or web address]. Pt understands that the cis[employer] webex software must be downloaded and operational on the device pt plans to use for the visit.  Pt would prefer to do webex because she used webex with her visit with Dr. Carlota Raspberry.

## 2019-04-28 NOTE — Telephone Encounter (Signed)
Received call from Dr. Carlota Raspberry. He is requesting an urgent referral for this pt with odd neurological symptoms detailed in his note from yesterday. Blood work that was ordered yesterday was resulted today. Dr. Jannifer Franklin has openings tomorrow.  I called pt to discuss. I called pt's home number, no answer, no VM set up. I called pt's work number, left a message asking her to call me back.

## 2019-04-28 NOTE — Telephone Encounter (Signed)
Okay for video evaluation at least initially.  If I feel need to see the patient in the office, we will get that set up quickly.

## 2019-04-29 ENCOUNTER — Other Ambulatory Visit: Payer: Self-pay

## 2019-04-29 ENCOUNTER — Ambulatory Visit (INDEPENDENT_AMBULATORY_CARE_PROVIDER_SITE_OTHER): Payer: BLUE CROSS/BLUE SHIELD | Admitting: Neurology

## 2019-04-29 ENCOUNTER — Encounter: Payer: Self-pay | Admitting: Neurology

## 2019-04-29 DIAGNOSIS — R202 Paresthesia of skin: Secondary | ICD-10-CM

## 2019-04-29 NOTE — Progress Notes (Signed)
Virtual Visit via Video Note  I connected with Christina Mora on 04/29/19 at  9:00 AM EDT by a video enabled telemedicine application and verified that I am speaking with the correct person using two identifiers.  Location: Patient: The patient is at home. Provider: Physician at office.   I discussed the limitations of evaluation and management by telemedicine and the availability of in person appointments. The patient expressed understanding and agreed to proceed.  History of Present Illness: Christina Mora is a 48 year old right-handed black female with a history of cerebral palsy with minimal residual deficits.  The patient does have some chronic issues with gait instability and occasional falls.  She is being evaluated for new symptoms that began about 5 weeks ago.  The patient initially began having some numbness in her arms, she may note that if she laid on her right side her right arm may become numb and tingly or if she laid on the left side her left arm would become numb.  If she woke up and shook the arm out the tingling would go away.  Within the last couple weeks, the patient has begun having symptoms that are now affecting both arms and both legs, mainly below the knees in the legs, left greater than right.  The episodes are occurring multiple times during the day and last anywhere from 2 minutes to 10 minutes.  She has had occasional events where she feels paralyzed with the arm on the left with severe numbness and tingling.  She denies any sensory complaints in the face or on the body.  The patient does not believe there is any true weakness with the events.  She denies any balance changes or difficulty controlling the bowels or the bladder.  She has not noted any headaches or dizziness and she denies any visual field disturbances.  The patient has not had any cognitive changes.  She is concerned about the worsening frequency and severity of the sensory events, she is sent to this  office for an evaluation.   Observations/Objective: The video evaluation reveals that the patient is alert and cooperative.  The patient has a normal speech pattern, no aphasia or dysarthria is noted.  She is answering questions appropriately.  Facial symmetry is present.  She has the ability to protrude the tongue in the midline with good lateral movement of the tongue.  Full extraocular movements are seen.  The patient is good range of movement the cervical spine.  The patient is good finger-nose-finger and heel shin bilaterally.  Gait is normal.  Tandem gait is normal.  Romberg is negative.  Assessment and Plan: 1.  Paresthesias, all 4 extremities  2.  History of cerebral palsy  The patient is having intermittent, purely sensory complaints with no objective abnormalities.  The history suggests a more benign etiology of the sensory complaints but given the evolution of new symptoms, the patient will need to be evaluated to exclude demyelinating disease.  The patient will be set up for blood work, she will have MRI of the brain and cervical spine with and without gadolinium enhancement.  She will follow-up here in 2 months for a more complete neurologic examination.  Follow Up Instructions: 53-monthfollow-up with me.   I discussed the assessment and treatment plan with the patient. The patient was provided an opportunity to ask questions and all were answered. The patient agreed with the plan and demonstrated an understanding of the instructions.   The patient was  advised to call back or seek an in-person evaluation if the symptoms worsen or if the condition fails to improve as anticipated.  I provided 30 minutes of non-face-to-face time during this encounter.   Kathrynn Ducking, MD

## 2019-05-03 ENCOUNTER — Telehealth: Payer: Self-pay

## 2019-05-03 ENCOUNTER — Telehealth: Payer: Self-pay | Admitting: Family Medicine

## 2019-05-03 NOTE — Telephone Encounter (Signed)
Copied from [place] [id number]. Topic: General - Other >> May 03, 2019  8:13 AM Burchel, Abbi R wrote: Pt returning call re: lab results, and repeat labs.

## 2019-05-03 NOTE — Telephone Encounter (Signed)
Pt called in to get results from recent testing, verbalized some normal concern due to the readings yet understanding. Says the readings are common for her but have yet to find out why, having no chronic conditions. She did not want to follow up with pcp for questioning

## 2019-05-03 NOTE — Telephone Encounter (Signed)
Spoke with pt and she informed me that she was given a call this morning and they have went over her labs.

## 2019-05-04 ENCOUNTER — Telehealth: Payer: Self-pay | Admitting: Neurology

## 2019-05-04 NOTE — Telephone Encounter (Signed)
no to the covid-19 questions MR Brain w/wo contrast & MR Cervical spine w/wo contrast Dr. Stephanie Acre Auth: [id number] (exp. 05/04/19 to 10/30/19). Patient is scheduled at [facility] for 05/11/19.

## 2019-05-04 NOTE — Telephone Encounter (Signed)
Wyatt Portela called from [facility] stating that patient wanted to go to [place] location .  MRI  Orders needed to be faxed to [phone] . Josem Kaufmann [phone] - 05/04/2019 - 10/30/2019

## 2019-05-04 NOTE — Telephone Encounter (Signed)
Noted,faxed order to [facility] they will reach out to the pt to schedule.

## 2019-05-11 ENCOUNTER — Other Ambulatory Visit: Payer: BLUE CROSS/BLUE SHIELD

## 2019-05-21 ENCOUNTER — Telehealth: Payer: Self-pay | Admitting: Family Medicine

## 2019-05-21 ENCOUNTER — Ambulatory Visit: Payer: Self-pay | Admitting: *Deleted

## 2019-05-21 NOTE — Telephone Encounter (Signed)
PT would like to speak to Dr. Pamella Pert. She is aware that the provider's schedule is full. She is refusing to go to Urgent Care. She is refusing to see other providers. She is also refusing virtual visits. PT had tingly sensation in arm that has turned into pain.

## 2019-05-21 NOTE — Telephone Encounter (Signed)
Having numbness or tingling feeling in her left side last month. Left arm, pins and needles feeling. No pins or needles now since last week, now having pain in the left arm from the upper part of her shoulder, shoulder, elbow and forearm. She had an appointment with her neurologists and was advised to have a MRI. She does not think that is what she needs right now.  She checked her b/p this morning and it was 148/101 at 8 am and now it is 108/72. She does not want a tele visit. Advised to go to an urgent care if she gets worst.  She is asking for Dr. Pamella Pert to give her call back today. She has taking extra tylenol for the pain and stated that it does help her. Notified flow at PCP regarding her symptoms and she will send message to her provider. Routing to flow at PCP.  Reason for Disposition . Weakness (i.e., loss of strength) in hand or fingers    (Exception: not truly weak; hand feels weak because of pain)  Answer Assessment - Initial Assessment Questions 1. ONSET: "When did the pain start?"     Last week 2. LOCATION: "Where is the pain located?"     Left am, elbow and forearm 3. PAIN: "How bad is the pain?" (Scale 1-10; or mild, moderate, severe)   - MILD (1-3): doesn't interfere with normal activities   - MODERATE (4-7): interferes with normal activities (e.g., work or school) or awakens from sleep   - SEVERE (8-10): excruciating pain, unable to do any normal activities, unable to move arm at all due to pain     Pain # between a 3 and 6 4. WORK OR EXERCISE: "Has there been any recent work or exercise that involved this part of the body?"     no 5. CAUSE: "What do you think is causing the shoulder pain?"     Not sure 6. OTHER SYMPTOMS: "Do you have any other symptoms?" (e.g., neck pain, swelling, rash, fever, numbness, weakness)     Some weakness,  7. PREGNANCY: "Is there any chance you are pregnant?" "When was your last menstrual period?"    Had hysterectomy  Protocols used:  SHOULDER PAIN-A-AH

## 2019-05-25 NOTE — Telephone Encounter (Signed)
Pt can schedule OV

## 2019-05-26 NOTE — Telephone Encounter (Signed)
Called patient to set her up for appt mail box is unable to accept messages FR

## 2019-05-27 DIAGNOSIS — M9901 Segmental and somatic dysfunction of cervical region: Secondary | ICD-10-CM | POA: Diagnosis not present

## 2019-05-27 DIAGNOSIS — M20001 Unspecified deformity of right finger(s): Secondary | ICD-10-CM | POA: Diagnosis not present

## 2019-05-27 DIAGNOSIS — M25512 Pain in left shoulder: Secondary | ICD-10-CM | POA: Diagnosis not present

## 2019-05-27 DIAGNOSIS — M9902 Segmental and somatic dysfunction of thoracic region: Secondary | ICD-10-CM | POA: Diagnosis not present

## 2019-06-03 DIAGNOSIS — M9902 Segmental and somatic dysfunction of thoracic region: Secondary | ICD-10-CM | POA: Diagnosis not present

## 2019-06-03 DIAGNOSIS — M9901 Segmental and somatic dysfunction of cervical region: Secondary | ICD-10-CM | POA: Diagnosis not present

## 2019-06-03 DIAGNOSIS — M25512 Pain in left shoulder: Secondary | ICD-10-CM | POA: Diagnosis not present

## 2019-06-03 DIAGNOSIS — M25532 Pain in left wrist: Secondary | ICD-10-CM | POA: Diagnosis not present

## 2019-07-09 DIAGNOSIS — Z20828 Contact with and (suspected) exposure to other viral communicable diseases: Secondary | ICD-10-CM | POA: Diagnosis not present

## 2019-09-20 DIAGNOSIS — M542 Cervicalgia: Secondary | ICD-10-CM | POA: Diagnosis not present

## 2019-09-20 DIAGNOSIS — M546 Pain in thoracic spine: Secondary | ICD-10-CM | POA: Diagnosis not present

## 2019-09-20 DIAGNOSIS — M9902 Segmental and somatic dysfunction of thoracic region: Secondary | ICD-10-CM | POA: Diagnosis not present

## 2019-09-20 DIAGNOSIS — M9901 Segmental and somatic dysfunction of cervical region: Secondary | ICD-10-CM | POA: Diagnosis not present

## 2019-09-22 ENCOUNTER — Ambulatory Visit: Payer: BLUE CROSS/BLUE SHIELD | Admitting: Neurology

## 2019-09-22 ENCOUNTER — Telehealth: Payer: Self-pay | Admitting: Neurology

## 2019-09-22 ENCOUNTER — Encounter: Payer: Self-pay | Admitting: Neurology

## 2019-09-22 ENCOUNTER — Encounter

## 2019-09-22 NOTE — Telephone Encounter (Signed)
This patient did not show for a revisit appointment today.  She also did not get MRI evaluations as ordered.

## 2019-11-29 DIAGNOSIS — M25532 Pain in left wrist: Secondary | ICD-10-CM | POA: Diagnosis not present

## 2019-11-29 DIAGNOSIS — M25512 Pain in left shoulder: Secondary | ICD-10-CM | POA: Diagnosis not present

## 2019-11-29 DIAGNOSIS — M546 Pain in thoracic spine: Secondary | ICD-10-CM | POA: Diagnosis not present

## 2019-11-29 DIAGNOSIS — M542 Cervicalgia: Secondary | ICD-10-CM | POA: Diagnosis not present

## 2019-11-30 DIAGNOSIS — M542 Cervicalgia: Secondary | ICD-10-CM | POA: Diagnosis not present

## 2019-11-30 DIAGNOSIS — M25512 Pain in left shoulder: Secondary | ICD-10-CM | POA: Diagnosis not present

## 2019-11-30 DIAGNOSIS — M25532 Pain in left wrist: Secondary | ICD-10-CM | POA: Diagnosis not present

## 2019-11-30 DIAGNOSIS — M546 Pain in thoracic spine: Secondary | ICD-10-CM | POA: Diagnosis not present

## 2019-12-01 DIAGNOSIS — M546 Pain in thoracic spine: Secondary | ICD-10-CM | POA: Diagnosis not present

## 2019-12-01 DIAGNOSIS — M25512 Pain in left shoulder: Secondary | ICD-10-CM | POA: Diagnosis not present

## 2019-12-01 DIAGNOSIS — M542 Cervicalgia: Secondary | ICD-10-CM | POA: Diagnosis not present

## 2019-12-01 DIAGNOSIS — M25532 Pain in left wrist: Secondary | ICD-10-CM | POA: Diagnosis not present

## 2019-12-02 ENCOUNTER — Encounter: Payer: BC Managed Care – PPO | Admitting: Family Medicine

## 2019-12-02 DIAGNOSIS — M546 Pain in thoracic spine: Secondary | ICD-10-CM | POA: Diagnosis not present

## 2019-12-02 DIAGNOSIS — M25532 Pain in left wrist: Secondary | ICD-10-CM | POA: Diagnosis not present

## 2019-12-02 DIAGNOSIS — M542 Cervicalgia: Secondary | ICD-10-CM | POA: Diagnosis not present

## 2019-12-02 DIAGNOSIS — M25512 Pain in left shoulder: Secondary | ICD-10-CM | POA: Diagnosis not present

## 2019-12-03 ENCOUNTER — Encounter: Payer: Self-pay | Admitting: Family Medicine

## 2019-12-03 DIAGNOSIS — M25512 Pain in left shoulder: Secondary | ICD-10-CM | POA: Diagnosis not present

## 2019-12-03 DIAGNOSIS — M5412 Radiculopathy, cervical region: Secondary | ICD-10-CM | POA: Insufficient documentation

## 2019-12-03 DIAGNOSIS — M542 Cervicalgia: Secondary | ICD-10-CM | POA: Diagnosis not present

## 2019-12-07 ENCOUNTER — Other Ambulatory Visit: Payer: Self-pay

## 2019-12-07 DIAGNOSIS — M25532 Pain in left wrist: Secondary | ICD-10-CM | POA: Diagnosis not present

## 2019-12-07 DIAGNOSIS — M25512 Pain in left shoulder: Secondary | ICD-10-CM | POA: Diagnosis not present

## 2019-12-07 DIAGNOSIS — M546 Pain in thoracic spine: Secondary | ICD-10-CM | POA: Diagnosis not present

## 2019-12-07 DIAGNOSIS — M542 Cervicalgia: Secondary | ICD-10-CM | POA: Diagnosis not present

## 2019-12-07 DIAGNOSIS — Z20822 Contact with and (suspected) exposure to covid-19: Secondary | ICD-10-CM

## 2019-12-09 DIAGNOSIS — M542 Cervicalgia: Secondary | ICD-10-CM | POA: Diagnosis not present

## 2019-12-09 DIAGNOSIS — M25512 Pain in left shoulder: Secondary | ICD-10-CM | POA: Diagnosis not present

## 2019-12-09 DIAGNOSIS — M546 Pain in thoracic spine: Secondary | ICD-10-CM | POA: Diagnosis not present

## 2019-12-09 DIAGNOSIS — M25532 Pain in left wrist: Secondary | ICD-10-CM | POA: Diagnosis not present

## 2019-12-09 LAB — NOVEL CORONAVIRUS, NAA: SARS-CoV-2, NAA: NOT DETECTED

## 2019-12-10 ENCOUNTER — Telehealth: Payer: Self-pay | Admitting: *Deleted

## 2019-12-10 NOTE — Telephone Encounter (Signed)
Patient called given negative covid results .

## 2019-12-14 DIAGNOSIS — M546 Pain in thoracic spine: Secondary | ICD-10-CM | POA: Diagnosis not present

## 2019-12-14 DIAGNOSIS — M25512 Pain in left shoulder: Secondary | ICD-10-CM | POA: Diagnosis not present

## 2019-12-14 DIAGNOSIS — M542 Cervicalgia: Secondary | ICD-10-CM | POA: Diagnosis not present

## 2019-12-14 DIAGNOSIS — M25532 Pain in left wrist: Secondary | ICD-10-CM | POA: Diagnosis not present

## 2019-12-14 DIAGNOSIS — M5412 Radiculopathy, cervical region: Secondary | ICD-10-CM | POA: Diagnosis not present

## 2019-12-16 DIAGNOSIS — M542 Cervicalgia: Secondary | ICD-10-CM | POA: Diagnosis not present

## 2019-12-16 DIAGNOSIS — M546 Pain in thoracic spine: Secondary | ICD-10-CM | POA: Diagnosis not present

## 2019-12-16 DIAGNOSIS — M25512 Pain in left shoulder: Secondary | ICD-10-CM | POA: Diagnosis not present

## 2019-12-16 DIAGNOSIS — M25532 Pain in left wrist: Secondary | ICD-10-CM | POA: Diagnosis not present

## 2020-05-03 ENCOUNTER — Telehealth: Payer: Self-pay | Admitting: Family Medicine

## 2020-05-03 DIAGNOSIS — Z13220 Encounter for screening for lipoid disorders: Secondary | ICD-10-CM

## 2020-05-03 DIAGNOSIS — Z130 Encounter for screening for diseases of the blood and blood-forming organs and certain disorders involving the immune mechanism: Secondary | ICD-10-CM

## 2020-05-03 DIAGNOSIS — R7303 Prediabetes: Secondary | ICD-10-CM

## 2020-05-03 DIAGNOSIS — Z0000 Encounter for general adult medical examination without abnormal findings: Secondary | ICD-10-CM

## 2020-05-03 NOTE — Telephone Encounter (Signed)
Pt is getting her cpe on 06/27/20 and her labs scheduled for 06/22/20. She will needs orders put in for her 06/22/20 lab visit.

## 2020-06-22 ENCOUNTER — Ambulatory Visit: Payer: BC Managed Care – PPO

## 2020-06-27 ENCOUNTER — Ambulatory Visit: Payer: Self-pay

## 2020-06-27 ENCOUNTER — Ambulatory Visit (INDEPENDENT_AMBULATORY_CARE_PROVIDER_SITE_OTHER): Payer: 59 | Admitting: Family Medicine

## 2020-06-27 ENCOUNTER — Telehealth: Payer: Self-pay | Admitting: *Deleted

## 2020-06-27 ENCOUNTER — Encounter: Payer: Self-pay | Admitting: Family Medicine

## 2020-06-27 ENCOUNTER — Other Ambulatory Visit: Payer: Self-pay

## 2020-06-27 VITALS — BP 103/72 | HR 78 | Temp 98.7°F | Ht 67.0 in | Wt 222.0 lb

## 2020-06-27 DIAGNOSIS — Z1211 Encounter for screening for malignant neoplasm of colon: Secondary | ICD-10-CM | POA: Diagnosis not present

## 2020-06-27 DIAGNOSIS — R7303 Prediabetes: Secondary | ICD-10-CM

## 2020-06-27 DIAGNOSIS — Z1231 Encounter for screening mammogram for malignant neoplasm of breast: Secondary | ICD-10-CM

## 2020-06-27 DIAGNOSIS — Z13220 Encounter for screening for lipoid disorders: Secondary | ICD-10-CM

## 2020-06-27 DIAGNOSIS — Z1329 Encounter for screening for other suspected endocrine disorder: Secondary | ICD-10-CM

## 2020-06-27 DIAGNOSIS — Z130 Encounter for screening for diseases of the blood and blood-forming organs and certain disorders involving the immune mechanism: Secondary | ICD-10-CM

## 2020-06-27 DIAGNOSIS — Z0001 Encounter for general adult medical examination with abnormal findings: Secondary | ICD-10-CM

## 2020-06-27 DIAGNOSIS — Z13228 Encounter for screening for other metabolic disorders: Secondary | ICD-10-CM

## 2020-06-27 DIAGNOSIS — Z0000 Encounter for general adult medical examination without abnormal findings: Secondary | ICD-10-CM

## 2020-06-27 MED ORDER — TRIAMCINOLONE 0.1 % CREAM:EUCERIN CREAM 1:1: 1.0000 "application " | TOPICAL_CREAM | Freq: Two times a day (BID) | CUTANEOUS | 0 refills | Status: DC | PRN

## 2020-06-27 NOTE — Telephone Encounter (Signed)
Faxed Cologuard requisition to [facility]. Confirmation page 2:18 pm.

## 2020-06-27 NOTE — Progress Notes (Signed)
6/29/20211:38 PM  Christina Mora 1971-05-11, 49 y.o., female [id number]  Chief Complaint  Patient presents with  . Annual Exam    request check for DM, mammo pended, hysterctomy     HPI:   Patient is a 49 y.o. female with past medical history significant for prediabtes who presents today for CPE  Last CPE 2019 Cervical Cancer Screening: hysterectomy Breast Cancer Screening: 2017, [facility] Colorectal Cancer Screening: due Bone Density Testing: at age 89 HIV Screening: 2017 STI Screening: 2017 Seasonal Influenza Vaccination: has had this season at [employer] Td/Tdap Vaccination: 2012 Pneumococcal Vaccination: at age 64 Zoster Vaccination: after age 38 Frequency of Dental evaluation: Q6 months, LTR Dentistry Frequency of Eye evaluation: yearly, Dr Doyce Para Fasting today  Depression screen [facility] 2/9 04/27/2019 11/02/2018 10/29/2017  Decreased Interest 0 0 0  Down, Depressed, Hopeless 0 0 0  PHQ - 2 Score 0 0 0    Fall Risk  06/27/2020 04/27/2019 11/02/2018 10/29/2017 07/29/2017  Falls in the past year? 0 0 0 No No  Number falls in past yr: 0 0 - - -  Comment - - - - -  Injury with Fall? 0 0 - - -  Follow up Falls evaluation completed - - - -     Allergies  Allergen Reactions  . Robaxin [Methocarbamol] Nausea And Vomiting    Prior to Admission medications   Medication Sig Start Date End Date Taking? Authorizing Provider  albuterol (PROVENTIL HFA;VENTOLIN HFA) 108 (90 Base) MCG/ACT inhaler Inhale 1-2 puffs into the lungs every 4 (four) hours as needed for wheezing or shortness of breath. Patient not taking: Reported on 06/27/2020 11/02/18   Rutherford Guys, MD  gabapentin (NEURONTIN) 300 MG capsule Take 1 capsule (300 mg total) by mouth 2 (two) times daily. Initially QD, increase to BID if tolerated. Patient not taking: Reported on 06/27/2020 04/27/19   Wendie Agreste, MD  Triamcinolone Acetonide (TRIAMCINOLONE 0.1 % CREAM : EUCERIN) CREA Apply 1 application topically 2 (two)  times daily as needed.    [provider]    Past Medical History:  Diagnosis Date  . Allergy    Loratadine PRN  . Anovulatory    10+ years  . Elevated hemoglobin A1c 2017  . Elevated WBC count 2017  . Fibroids   . Pre-diabetes   . SVD (spontaneous vaginal delivery)    x 1    Past Surgical History:  Procedure Laterality Date  . ABDOMINAL HYSTERECTOMY  09/24/2016  . DILATION AND CURETTAGE OF UTERUS     x 4, 3 for heavy bleeding, 1 for MAB  . EYE SURGERY Bilateral    as a child at age 60 or 4  . FRACTURE SURGERY     R femur fracture age 3.  . HYSTERECTOMY ABDOMINAL WITH SALPINGECTOMY Bilateral 09/24/2016   Procedure: HYSTERECTOMY ABDOMINAL WITH Bilateral SALPINGECTOMY with Lysis of Adhesions;  Surgeon: Nunzio Cobbs, MD;  Location: [facility];  Service: Gynecology;  Laterality: Bilateral;  3 hours    Social History   Tobacco Use  . Smoking status: Never Smoker  . Smokeless tobacco: Never Used  Substance Use Topics  . Alcohol use: Yes    Alcohol/week: 0.0 standard drinks    Comment: occasional wine     Family History  Problem Relation Age of Onset  . Stroke Mother   . Heart disease Mother        AMI x few with defibrillator/CHF  . COPD Mother   . Epilepsy  Mother   . Obesity Mother   . Heart attack Mother   . Multiple sclerosis Maternal Grandmother   . Cancer Paternal Grandmother   . Breast cancer Paternal Aunt   . Breast cancer Paternal Aunt   . Breast cancer Paternal Aunt     Review of Systems  Constitutional: Negative for chills and fever.  Respiratory: Negative for cough and shortness of breath.   Cardiovascular: Negative for chest pain, palpitations and leg swelling.  Gastrointestinal: Negative for abdominal pain, nausea and vomiting.  Musculoskeletal: Positive for joint pain and neck pain.  All other systems reviewed and are negative.    OBJECTIVE:  Today's Vitals   06/27/20 1329  BP: 103/72  Pulse: 78  Temp: 98.7 F (37.1 C)    SpO2: 99%  Weight: 222 lb (100.7 kg)  Height: 5' 7"  (1.702 m)   Body mass index is 34.77 kg/m.   Physical Exam Vitals and nursing note reviewed. Exam conducted with a chaperone present.  Constitutional:      Appearance: She is well-developed.  HENT:     Head: Normocephalic and atraumatic.     Right Ear: Hearing, tympanic membrane, ear canal and external ear normal.     Left Ear: Hearing, tympanic membrane, ear canal and external ear normal.     Mouth/Throat:     Mouth: Mucous membranes are moist.     Pharynx: No oropharyngeal exudate or posterior oropharyngeal erythema.  Eyes:     Extraocular Movements: Extraocular movements intact.     Conjunctiva/sclera: Conjunctivae normal.     Pupils: Pupils are equal, round, and reactive to light.  Neck:     Thyroid: No thyromegaly.  Cardiovascular:     Rate and Rhythm: Normal rate and regular rhythm.     Heart sounds: Normal heart sounds. No murmur heard.  No friction rub. No gallop.   Pulmonary:     Effort: Pulmonary effort is normal.     Breath sounds: Normal breath sounds. No wheezing, rhonchi or rales.  Chest:     Breasts:        Right: No mass, nipple discharge or skin change.        Left: No mass, nipple discharge or skin change.  Abdominal:     General: Bowel sounds are normal. There is no distension.     Palpations: Abdomen is soft. There is no hepatomegaly, splenomegaly or mass.     Tenderness: There is no abdominal tenderness.  Musculoskeletal:        General: Normal range of motion.     Cervical back: Neck supple.     Right lower leg: No edema.     Left lower leg: No edema.  Lymphadenopathy:     Cervical: No cervical adenopathy.     Upper Body:     Right upper body: No supraclavicular, axillary or pectoral adenopathy.     Left upper body: No supraclavicular, axillary or pectoral adenopathy.  Skin:    General: Skin is warm and dry.  Neurological:     Mental Status: She is alert and oriented to person, place, and  time.     Cranial Nerves: No cranial nerve deficit.     Gait: Gait normal.     Deep Tendon Reflexes: Reflexes are normal and symmetric.  Psychiatric:        Mood and Affect: Mood normal.        Behavior: Behavior normal.     No results found for this or any previous visit (from  the past 24 hour(s)).  No results found.   ASSESSMENT and PLAN  1. Annual physical exam No concerns per history or exam. Routine HCM labs ordered. HCM reviewed/discussed. Anticipatory guidance regarding healthy weight, lifestyle and choices given.   2. Pre-diabetes - Hemoglobin A1c  3. Screen for colon cancer - Cologuard  4. Visit for screening mammogram - MM DIGITAL SCREENING BILATERAL  5. Screening for deficiency anemia - CBC  6. Screening for lipoid disorders - Lipid panel  7. Screening for thyroid disorder - TSH  8. Screening for endocrine, metabolic and immunity disorder - Comprehensive metabolic panel  Other orders - Triamcinolone Acetonide (TRIAMCINOLONE 0.1 % CREAM : EUCERIN) CREA; Apply 1 application topically 2 (two) times daily as needed.  Return in about 1 year (around 06/27/2021).    Rutherford Guys, MD [facility], [place] [address] Ph.  [phone] Fax [phone]

## 2020-06-27 NOTE — Patient Instructions (Addendum)
   If you have lab work done today you will be contacted with your lab results within the next 2 weeks.  If you have not heard from us then please contact us. The fastest way to get your results is to register for My Chart.   IF you received an x-ray today, you will receive an invoice from [facility]. Please contact [facility] at [phone] with questions or concerns regarding your invoice.   IF you received labwork today, you will receive an invoice from [facility]. Please contact [facility] at [phone] with questions or concerns regarding your invoice.   Our billing staff will not be able to assist you with questions regarding bills from these companies.  You will be contacted with the lab results as soon as they are available. The fastest way to get your results is to activate your My Chart account. Instructions are located on the last page of this paperwork. If you have not heard from us regarding the results in 2 weeks, please contact this office.     Preventive Care 40-64 Years Old, Female Preventive care refers to visits with your health care provider and lifestyle choices that can promote health and wellness. This includes:  A yearly physical exam. This may also be called an annual well check.  Regular dental visits and eye exams.  Immunizations.  Screening for certain conditions.  Healthy lifestyle choices, such as eating a healthy diet, getting regular exercise, not using drugs or products that contain nicotine and tobacco, and limiting alcohol use. What can I expect for my preventive care visit? Physical exam Your health care provider will check your:  Height and weight. This may be used to calculate body mass index (BMI), which tells if you are at a healthy weight.  Heart rate and blood pressure.  Skin for abnormal spots. Counseling Your health care provider may ask you questions about your:  Alcohol, tobacco, and drug use.  Emotional  well-being.  Home and relationship well-being.  Sexual activity.  Eating habits.  Work and work environment.  Method of birth control.  Menstrual cycle.  Pregnancy history. What immunizations do I need?  Influenza (flu) vaccine  This is recommended every year. Tetanus, diphtheria, and pertussis (Tdap) vaccine  You may need a Td booster every 10 years. Varicella (chickenpox) vaccine  You may need this if you have not been vaccinated. Zoster (shingles) vaccine  You may need this after age 60. Measles, mumps, and rubella (MMR) vaccine  You may need at least one dose of MMR if you were born in 1957 or later. You may also need a second dose. Pneumococcal conjugate (PCV13) vaccine  You may need this if you have certain conditions and were not previously vaccinated. Pneumococcal polysaccharide (PPSV23) vaccine  You may need one or two doses if you smoke cigarettes or if you have certain conditions. Meningococcal conjugate (MenACWY) vaccine  You may need this if you have certain conditions. Hepatitis A vaccine  You may need this if you have certain conditions or if you travel or work in places where you may be exposed to hepatitis A. Hepatitis B vaccine  You may need this if you have certain conditions or if you travel or work in places where you may be exposed to hepatitis B. Haemophilus influenzae type b (Hib) vaccine  You may need this if you have certain conditions. Human papillomavirus (HPV) vaccine  If recommended by your health care provider, you may need three doses over 6 months.   You may receive vaccines as individual doses or as more than one vaccine together in one shot (combination vaccines). Talk with your health care provider about the risks and benefits of combination vaccines. What tests do I need? Blood tests  Lipid and cholesterol levels. These may be checked every 5 years, or more frequently if you are over 50 years old.  Hepatitis C  test.  Hepatitis B test. Screening  Lung cancer screening. You may have this screening every year starting at age 55 if you have a 30-pack-year history of smoking and currently smoke or have quit within the past 15 years.  Colorectal cancer screening. All adults should have this screening starting at age 50 and continuing until age 75. Your health care provider may recommend screening at age 45 if you are at increased risk. You will have tests every 1-10 years, depending on your results and the type of screening test.  Diabetes screening. This is done by checking your blood sugar (glucose) after you have not eaten for a while (fasting). You may have this done every 1-3 years.  Mammogram. This may be done every 1-2 years. Talk with your health care provider about when you should start having regular mammograms. This may depend on whether you have a family history of breast cancer.  BRCA-related cancer screening. This may be done if you have a family history of breast, ovarian, tubal, or peritoneal cancers.  Pelvic exam and Pap test. This may be done every 3 years starting at age 21. Starting at age 30, this may be done every 5 years if you have a Pap test in combination with an HPV test. Other tests  Sexually transmitted disease (STD) testing.  Bone density scan. This is done to screen for osteoporosis. You may have this scan if you are at high risk for osteoporosis. Follow these instructions at home: Eating and drinking  Eat a diet that includes fresh fruits and vegetables, whole grains, lean protein, and low-fat dairy.  Take vitamin and mineral supplements as recommended by your health care provider.  Do not drink alcohol if: ? Your health care provider tells you not to drink. ? You are pregnant, may be pregnant, or are planning to become pregnant.  If you drink alcohol: ? Limit how much you have to 0-1 drink a day. ? Be aware of how much alcohol is in your drink. In the [place] one  drink equals one 12 oz bottle of beer (355 mL), one 5 oz glass of wine (148 mL), or one 1 oz glass of hard liquor (44 mL). Lifestyle  Take daily care of your teeth and gums.  Stay active. Exercise for at least 30 minutes on 5 or more days each week.  Do not use any products that contain nicotine or tobacco, such as cigarettes, e-cigarettes, and chewing tobacco. If you need help quitting, ask your health care provider.  If you are sexually active, practice safe sex. Use a condom or other form of birth control (contraception) in order to prevent pregnancy and STIs (sexually transmitted infections).  If told by your health care provider, take low-dose aspirin daily starting at age 50. What's next?  Visit your health care provider once a year for a well check visit.  Ask your health care provider how often you should have your eyes and teeth checked.  Stay up to date on all vaccines. This information is not intended to replace advice given to you by your health care provider. Make sure   you discuss any questions you have with your health care provider. Document Revised: 08/27/2018 Document Reviewed: 08/27/2018 Elsevier Patient Education  2020 [employer]  

## 2020-06-28 LAB — CBC
Hematocrit: 38.8 % (ref 34.0–46.6)
Hemoglobin: 12.5 g/dL (ref 11.1–15.9)
MCH: 28.7 pg (ref 26.6–33.0)
MCHC: 32.2 g/dL (ref 31.5–35.7)
MCV: 89 fL (ref 79–97)
Platelets: 384 10*3/uL (ref 150–450)
RBC: 4.36 x10E6/uL (ref 3.77–5.28)
RDW: 12.4 % (ref 11.7–15.4)
WBC: 13 10*3/uL — ABNORMAL HIGH (ref 3.4–10.8)

## 2020-06-28 LAB — LIPID PANEL
Chol/HDL Ratio: 5.1 ratio — ABNORMAL HIGH (ref 0.0–4.4)
Cholesterol, Total: 211 mg/dL — ABNORMAL HIGH (ref 100–199)
HDL: 41 mg/dL (ref >39)
LDL Chol Calc (NIH): 143 mg/dL — ABNORMAL HIGH (ref 0–99)
Triglycerides: 149 mg/dL (ref 0–149)
VLDL Cholesterol Cal: 27 mg/dL (ref 5–40)

## 2020-06-28 LAB — COMPREHENSIVE METABOLIC PANEL
ALT: 12 IU/L (ref 0–32)
AST: 17 IU/L (ref 0–40)
Albumin/Globulin Ratio: 1.8 (ref 1.2–2.2)
Albumin: 4.7 g/dL (ref 3.8–4.8)
Alkaline Phosphatase: 82 IU/L (ref 48–121)
BUN/Creatinine Ratio: 15 (ref 9–23)
BUN: 13 mg/dL (ref 6–24)
Bilirubin Total: 0.5 mg/dL (ref 0.0–1.2)
CO2: 24 mmol/L (ref 20–29)
Calcium: 9.3 mg/dL (ref 8.7–10.2)
Chloride: 99 mmol/L (ref 96–106)
Creatinine, Ser: 0.89 mg/dL (ref 0.57–1.00)
GFR calc Af Amer: 89 mL/min/{1.73_m2} (ref >59)
GFR calc non Af Amer: 77 mL/min/{1.73_m2} (ref >59)
Globulin, Total: 2.6 g/dL (ref 1.5–4.5)
Glucose: 79 mg/dL (ref 65–99)
Potassium: 4.1 mmol/L (ref 3.5–5.2)
Sodium: 136 mmol/L (ref 134–144)
Total Protein: 7.3 g/dL (ref 6.0–8.5)

## 2020-06-28 LAB — HEMOGLOBIN A1C
Est. average glucose Bld gHb Est-mCnc: 111 mg/dL
Hgb A1c MFr Bld: 5.5 % (ref 4.8–5.6)

## 2020-06-28 LAB — TSH: TSH: 2.92 u[IU]/mL (ref 0.450–4.500)

## 2020-07-14 LAB — COLOGUARD: COLOGUARD: NEGATIVE

## 2020-07-14 LAB — EXTERNAL GENERIC LAB PROCEDURE: COLOGUARD: NEGATIVE

## 2020-07-18 LAB — COLOGUARD: Cologuard: NEGATIVE

## 2020-09-06 ENCOUNTER — Telehealth: Payer: Self-pay | Admitting: Family Medicine

## 2020-09-06 NOTE — Telephone Encounter (Signed)
I have called pt and informed her that I do see that her CPE in June however I did not see information about that medication. The provider is not in today so I will send her a message. The pt stated understanding.   Dr. I do not see anything about the LORAZEPAM medication. Please advise if you are willing to prescribe this to pt on a PRN basis.

## 2020-09-06 NOTE — Telephone Encounter (Signed)
Pt had cpe with dr Pamella Pert on 06-27-2020 and per pt they discuss her getting a refill on lorazepam .pt does not remember the mg. Pt does not take this medication everyday. [facility]. Pt would like a callback

## 2020-09-07 MED ORDER — LORAZEPAM 0.5 MG PO TABS: 0.5000 mg | ORAL_TABLET | Freq: Three times a day (TID) | ORAL | 0 refills | Status: DC

## 2020-09-07 NOTE — Telephone Encounter (Signed)
Med refilled by me in 2019 She takes very prn pmp reviewed, no refills since my rx in 2019 Med refilled as I have seen patient recently for her [place] only covers 0.31m dose, not 112mdose.

## 2020-09-20 ENCOUNTER — Telehealth: Payer: Self-pay | Admitting: *Deleted

## 2020-09-20 NOTE — Telephone Encounter (Signed)
Schedule mobile mammogram 9-27 @ [facility]

## 2020-11-16 ENCOUNTER — Other Ambulatory Visit: Payer: Self-pay

## 2020-11-16 ENCOUNTER — Encounter (HOSPITAL_COMMUNITY): Payer: Self-pay

## 2020-11-16 ENCOUNTER — Emergency Department (HOSPITAL_COMMUNITY)
Admission: EM | Admit: 2020-11-16 | Discharge: 2020-11-17 | Disposition: A | Discharge status: Home / Self Care | Payer: 59 | Attending: Emergency Medicine | Admitting: Emergency Medicine

## 2020-11-16 DIAGNOSIS — R1030 Lower abdominal pain, unspecified: Secondary | ICD-10-CM | POA: Diagnosis present

## 2020-11-16 DIAGNOSIS — N200 Calculus of kidney: Secondary | ICD-10-CM

## 2020-11-16 DIAGNOSIS — R109 Unspecified abdominal pain: Secondary | ICD-10-CM

## 2020-11-16 NOTE — ED Triage Notes (Signed)
Pt reports bilateral flank pain and suprapubic pain since mid September.

## 2020-11-17 ENCOUNTER — Encounter (HOSPITAL_COMMUNITY): Payer: Self-pay | Admitting: Emergency Medicine

## 2020-11-17 ENCOUNTER — Emergency Department (HOSPITAL_COMMUNITY): Payer: 59

## 2020-11-17 LAB — URINALYSIS, ROUTINE W REFLEX MICROSCOPIC
Bacteria, UA: NONE SEEN
Bilirubin Urine: NEGATIVE
Glucose, UA: NEGATIVE mg/dL
Ketones, ur: NEGATIVE mg/dL
Leukocytes,Ua: NEGATIVE
Nitrite: NEGATIVE
Protein, ur: NEGATIVE mg/dL
Specific Gravity, Urine: 1.004 — ABNORMAL LOW (ref 1.005–1.030)
pH: 6 (ref 5.0–8.0)

## 2020-11-17 LAB — CBC
HCT: 38.3 % (ref 36.0–46.0)
Hemoglobin: 12.5 g/dL (ref 12.0–15.0)
MCH: 29.3 pg (ref 26.0–34.0)
MCHC: 32.6 g/dL (ref 30.0–36.0)
MCV: 89.9 fL (ref 80.0–100.0)
Platelets: 349 10*3/uL (ref 150–400)
RBC: 4.26 MIL/uL (ref 3.87–5.11)
RDW: 12.4 % (ref 11.5–15.5)
WBC: 12.7 10*3/uL — ABNORMAL HIGH (ref 4.0–10.5)
nRBC: 0 % (ref 0.0–0.2)

## 2020-11-17 LAB — COMPREHENSIVE METABOLIC PANEL
ALT: 16 U/L (ref 0–44)
AST: 17 U/L (ref 15–41)
Albumin: 4.3 g/dL (ref 3.5–5.0)
Alkaline Phosphatase: 77 U/L (ref 38–126)
Anion gap: 9 (ref 5–15)
BUN: 11 mg/dL (ref 6–20)
CO2: 22 mmol/L (ref 22–32)
Calcium: 8.9 mg/dL (ref 8.9–10.3)
Chloride: 103 mmol/L (ref 98–111)
Creatinine, Ser: 0.94 mg/dL (ref 0.44–1.00)
GFR, Estimated: >60 mL/min (ref >60)
Glucose, Bld: 110 mg/dL — ABNORMAL HIGH (ref 70–99)
Potassium: 3.6 mmol/L (ref 3.5–5.1)
Sodium: 134 mmol/L — ABNORMAL LOW (ref 135–145)
Total Bilirubin: 0.4 mg/dL (ref 0.3–1.2)
Total Protein: 7.6 g/dL (ref 6.5–8.1)

## 2020-11-17 LAB — LIPASE, BLOOD: Lipase: 35 U/L (ref 11–51)

## 2020-11-17 MED ORDER — DICLOFENAC SODIUM ER 100 MG PO TB24
100.0000 mg | ORAL_TABLET | Freq: Every day | ORAL | 0 refills | Status: DC
Start: 2020-11-17 — End: 2021-01-22

## 2020-11-17 MED ORDER — TAMSULOSIN HCL 0.4 MG PO CAPS
0.4000 mg | ORAL_CAPSULE | ORAL | Status: AC
  Administered 2020-11-17: 0.4 mg via ORAL
  Filled 2020-11-17: qty 1

## 2020-11-17 MED ORDER — KETOROLAC TROMETHAMINE 60 MG/2ML IM SOLN
60.0000 mg | Freq: Once | INTRAMUSCULAR | Status: AC
  Administered 2020-11-17: 60 mg via INTRAMUSCULAR
  Filled 2020-11-17: qty 2

## 2020-11-17 MED ORDER — TAMSULOSIN HCL 0.4 MG PO CAPS
0.4000 mg | ORAL_CAPSULE | Freq: Every day | ORAL | 0 refills | Status: DC
Start: 2020-11-17 — End: 2021-01-22

## 2020-11-17 NOTE — ED Provider Notes (Addendum)
[facility] DEPT Provider Note   CSN: [id number] Arrival date & time: 11/16/20  2302     History Chief Complaint  Patient presents with  . Flank Pain    Christina Mora is a 49 y.o. female.  The history is provided by the patient.  Flank Pain This is a chronic problem. The current episode started more than 1 week ago (3 months ago has had B flank pain since September ). The problem occurs constantly. The problem has not changed since onset.Pertinent negatives include no chest pain, no headaches and no shortness of breath. Nothing aggravates the symptoms. Nothing relieves the symptoms. The treatment provided no relief.  Patient states she has been seen by multiple doctors including GYN for B flank pain since mid September,  Has been on antibiotics (she does not know name) and these have not changed the pain.  Moreover she has had normal pelvic US.  No n/v.      Past Medical History:  Diagnosis Date  . Allergy    Loratadine PRN  . Anovulatory    10+ years  . Elevated hemoglobin A1c 2017  . Elevated WBC count 2017  . Fibroids   . Pre-diabetes   . SVD (spontaneous vaginal delivery)    x 1    Patient Active Problem List   Diagnosis Date Noted  . Need for influenza vaccination 10/29/2017  . Anxiety state 10/29/2017  . Status post total abdominal hysterectomy 09/24/2016  . Obesity, unspecified 05/17/2014  . Chronic allergic rhinitis 05/17/2014    Past Surgical History:  Procedure Laterality Date  . ABDOMINAL HYSTERECTOMY  09/24/2016  . DILATION AND CURETTAGE OF UTERUS     x 4, 3 for heavy bleeding, 1 for MAB  . EYE SURGERY Bilateral    as a child at age 10 or 4  . FRACTURE SURGERY     R femur fracture age 89.  . HYSTERECTOMY ABDOMINAL WITH SALPINGECTOMY Bilateral 09/24/2016   Procedure: HYSTERECTOMY ABDOMINAL WITH Bilateral SALPINGECTOMY with Lysis of Adhesions;  Surgeon: Nunzio Cobbs, MD;  Location: [facility];  Service: Gynecology;   Laterality: Bilateral;  3 hours     OB History    Gravida  2   Para  1   Term  1   Preterm      AB  1   Living  1     SAB  1   TAB      Ectopic      Multiple      Live Births  1           Family History  Problem Relation Age of Onset  . Stroke Mother   . Heart disease Mother        AMI x few with defibrillator/CHF  . COPD Mother   . Epilepsy Mother   . Obesity Mother   . Heart attack Mother   . Multiple sclerosis Maternal Grandmother   . Cancer Paternal Grandmother   . Breast cancer Paternal Aunt   . Breast cancer Paternal Aunt   . Breast cancer Paternal Aunt     Social History   Tobacco Use  . Smoking status: Never Smoker  . Smokeless tobacco: Never Used  Substance Use Topics  . Alcohol use: Yes    Alcohol/week: 0.0 standard drinks    Comment: occasional wine   . Drug use: No    Home Medications Prior to Admission medications   Medication Sig Start Date End  Date Taking? Authorizing Provider  naproxen sodium (ALEVE) 220 MG tablet Take 220 mg by mouth daily as needed (pain).   Yes [provider]  albuterol (PROVENTIL HFA;VENTOLIN HFA) 108 (90 Base) MCG/ACT inhaler Inhale 1-2 puffs into the lungs every 4 (four) hours as needed for wheezing or shortness of breath. Patient not taking: Reported on 06/27/2020 11/02/18   Rutherford Guys, MD  LORazepam (ATIVAN) 0.5 MG tablet Take 1 tablet (0.5 mg total) by mouth every 8 (eight) hours. Patient not taking: Reported on 11/17/2020 09/07/20   Rutherford Guys, MD  Triamcinolone Acetonide (TRIAMCINOLONE 0.1 % CREAM : EUCERIN) CREA Apply 1 application topically 2 (two) times daily as needed. Patient not taking: Reported on 11/17/2020 06/27/20   Rutherford Guys, MD    Allergies    Robaxin [methocarbamol]  Review of Systems   Review of Systems  Constitutional: Negative for unexpected weight change.  HENT: Negative for congestion.   Eyes: Negative for visual disturbance.  Respiratory: Negative for  shortness of breath.   Cardiovascular: Negative for chest pain.  Gastrointestinal: Negative for anal bleeding.  Genitourinary: Positive for flank pain.  Musculoskeletal: Negative for arthralgias.  Skin: Negative for rash.  Neurological: Negative for headaches.  Psychiatric/Behavioral: Negative for agitation.  All other systems reviewed and are negative.   Physical Exam Updated Vital Signs BP 135/88   Pulse (!) 111   Temp 100.2 F (37.9 C) (Oral)   Resp 18   Ht 5' 7"  (1.702 m)   Wt 99.8 kg   LMP 08/23/2016   SpO2 98%   BMI 34.46 kg/m   Physical Exam Vitals and nursing note reviewed.  Constitutional:      General: She is not in acute distress.    Appearance: Normal appearance.     Comments: Sleeping in the room upon entrance   HENT:     Head: Normocephalic.     Nose: Nose normal.  Eyes:     Pupils: Pupils are equal, round, and reactive to light.  Cardiovascular:     Rate and Rhythm: Normal rate. Rhythm irregular.     Pulses: Normal pulses.     Heart sounds: Normal heart sounds.  Pulmonary:     Effort: Pulmonary effort is normal.     Breath sounds: Normal breath sounds.  Abdominal:     General: Abdomen is flat. Bowel sounds are normal.     Palpations: Abdomen is soft.     Tenderness: There is no abdominal tenderness. There is no guarding or rebound.     Hernia: No hernia is present.  Musculoskeletal:        General: Normal range of motion.     Cervical back: Normal range of motion and neck supple.  Skin:    General: Skin is warm and dry.     Capillary Refill: Capillary refill takes less than 2 seconds.  Neurological:     General: No focal deficit present.     Mental Status: She is alert and oriented to person, place, and time.  Psychiatric:        Mood and Affect: Mood normal.        Behavior: Behavior normal.     ED Results / Procedures / Treatments   Labs (all labs ordered are listed, but only abnormal results are displayed) Results for orders placed  or performed during the hospital encounter of 11/16/20  Lipase, blood  Result Value Ref Range   Lipase 35 11 - 51 U/L  Comprehensive metabolic panel  Result Value Ref Range   Sodium 134 (L) 135 - 145 mmol/L   Potassium 3.6 3.5 - 5.1 mmol/L   Chloride 103 98 - 111 mmol/L   CO2 22 22 - 32 mmol/L   Glucose, Bld 110 (H) 70 - 99 mg/dL   BUN 11 6 - 20 mg/dL   Creatinine, Ser 0.94 0.44 - 1.00 mg/dL   Calcium 8.9 8.9 - 10.3 mg/dL   Total Protein 7.6 6.5 - 8.1 g/dL   Albumin 4.3 3.5 - 5.0 g/dL   AST 17 15 - 41 U/L   ALT 16 0 - 44 U/L   Alkaline Phosphatase 77 38 - 126 U/L   Total Bilirubin 0.4 0.3 - 1.2 mg/dL   GFR, Estimated >60 >60 mL/min   Anion gap 9 5 - 15  CBC  Result Value Ref Range   WBC 12.7 (H) 4.0 - 10.5 K/uL   RBC 4.26 3.87 - 5.11 MIL/uL   Hemoglobin 12.5 12.0 - 15.0 g/dL   HCT 38.3 36 - 46 %   MCV 89.9 80.0 - 100.0 fL   MCH 29.3 26.0 - 34.0 pg   MCHC 32.6 30.0 - 36.0 g/dL   RDW 12.4 11.5 - 15.5 %   Platelets 349 150 - 400 K/uL   nRBC 0.0 0.0 - 0.2 %  Urinalysis, Routine w reflex microscopic  Result Value Ref Range   Color, Urine STRAW (A) YELLOW   APPearance CLEAR CLEAR   Specific Gravity, Urine 1.004 (L) 1.005 - 1.030   pH 6.0 5.0 - 8.0   Glucose, UA NEGATIVE NEGATIVE mg/dL   Hgb urine dipstick MODERATE (A) NEGATIVE   Bilirubin Urine NEGATIVE NEGATIVE   Ketones, ur NEGATIVE NEGATIVE mg/dL   Protein, ur NEGATIVE NEGATIVE mg/dL   Nitrite NEGATIVE NEGATIVE   Leukocytes,Ua NEGATIVE NEGATIVE   RBC / HPF 0-5 0 - 5 RBC/hpf   WBC, UA 0-5 0 - 5 WBC/hpf   Bacteria, UA NONE SEEN NONE SEEN   Squamous Epithelial / LPF 0-5 0 - 5   CT Renal Stone Study  Result Date: 11/17/2020 CLINICAL DATA:  Bilateral flank and suprapubic pain for 2 months, microhematuria EXAM: CT ABDOMEN AND PELVIS WITHOUT CONTRAST TECHNIQUE: Multidetector CT imaging of the abdomen and pelvis was performed following the standard protocol without IV contrast. COMPARISON:  CT 07/19/2011 FINDINGS: Lower  chest: Lung bases are clear. Normal heart size. No pericardial effusion. Hepatobiliary: No visible focal liver lesions within the limitations of this unenhanced CT. Smooth liver surface contour. Normal hepatic attenuation. Normal gallbladder and biliary tree. Pancreas: No pancreatic ductal dilatation or surrounding inflammatory changes. Spleen: Normal in size. No concerning splenic lesions. Adrenals/Urinary Tract: Normal adrenal glands. Kidneys are symmetric in size and normally located. No visible or concerning renal lesion. Slight asymmetry of the right renal collecting system with a 2 mm calculus in the vicinity of the right ureterovesicular junction which is possibly associated with the distal ureter the difficult to fully ascertain. No other visible urolithiasis. No left urinary tract dilatation. Urinary bladder is unremarkable. Stomach/Bowel: Distal esophagus and stomach are unremarkable. Few air-filled duodenal diverticula are present about the level of the pancreatic head and uncinate. Distal duodenum is unremarkable. No small bowel thickening or dilatation. A normal appendix is visualized. No colonic dilatation or wall thickening. Vascular/Lymphatic: Minimal plaque seen in the left iliac artery. No other significant vascular findings within limitations of this unenhanced CT. No suspicious or enlarged lymph nodes in the included lymphatic chains. Reproductive: Prior hysterectomy. Retention of  the ovaries with normal follicles bilaterally. Other: No abdominopelvic free fluid or free gas. No bowel containing hernias. Musculoskeletal: No acute osseous abnormality or suspicious osseous lesion. Mild multilevel degenerative changes and Schmorl's node formations are present in the imaged portions of the spine. IMPRESSION: Mild asymmetric right ureterectasis with a 2 mm calculus in the vicinity of the right ureterovesicular junction which could reflect a small urolith. No other acute abdominopelvic abnormality.  Prior hysterectomy. Electronically Signed   By: Lovena Le M.D.   On: 11/17/2020 01:49    EKG None  Radiology CT Renal Stone Study  Result Date: 11/17/2020 CLINICAL DATA:  Bilateral flank and suprapubic pain for 2 months, microhematuria EXAM: CT ABDOMEN AND PELVIS WITHOUT CONTRAST TECHNIQUE: Multidetector CT imaging of the abdomen and pelvis was performed following the standard protocol without IV contrast. COMPARISON:  CT 07/19/2011 FINDINGS: Lower chest: Lung bases are clear. Normal heart size. No pericardial effusion. Hepatobiliary: No visible focal liver lesions within the limitations of this unenhanced CT. Smooth liver surface contour. Normal hepatic attenuation. Normal gallbladder and biliary tree. Pancreas: No pancreatic ductal dilatation or surrounding inflammatory changes. Spleen: Normal in size. No concerning splenic lesions. Adrenals/Urinary Tract: Normal adrenal glands. Kidneys are symmetric in size and normally located. No visible or concerning renal lesion. Slight asymmetry of the right renal collecting system with a 2 mm calculus in the vicinity of the right ureterovesicular junction which is possibly associated with the distal ureter the difficult to fully ascertain. No other visible urolithiasis. No left urinary tract dilatation. Urinary bladder is unremarkable. Stomach/Bowel: Distal esophagus and stomach are unremarkable. Few air-filled duodenal diverticula are present about the level of the pancreatic head and uncinate. Distal duodenum is unremarkable. No small bowel thickening or dilatation. A normal appendix is visualized. No colonic dilatation or wall thickening. Vascular/Lymphatic: Minimal plaque seen in the left iliac artery. No other significant vascular findings within limitations of this unenhanced CT. No suspicious or enlarged lymph nodes in the included lymphatic chains. Reproductive: Prior hysterectomy. Retention of the ovaries with normal follicles bilaterally. Other: No  abdominopelvic free fluid or free gas. No bowel containing hernias. Musculoskeletal: No acute osseous abnormality or suspicious osseous lesion. Mild multilevel degenerative changes and Schmorl's node formations are present in the imaged portions of the spine. IMPRESSION: Mild asymmetric right ureterectasis with a 2 mm calculus in the vicinity of the right ureterovesicular junction which could reflect a small urolith. No other acute abdominopelvic abnormality. Prior hysterectomy. Electronically Signed   By: Lovena Le M.D.   On: 11/17/2020 01:49    Procedures Procedures (including critical care time)  Medications Ordered in ED Medications  ketorolac (TORADOL) injection 60 mg (60 mg Intramuscular Given 11/17/20 0201)  tamsulosin (FLOMAX) capsule 0.4 mg (0.4 mg Oral Given 11/17/20 0201)    ED Course  I have reviewed the triage vital signs and the nursing notes.  Pertinent labs & imaging results that were available during my care of the patient were reviewed by me and considered in my medical decision making (see chart for details).    Patient with 3 months of B flank pain.  There is nothing on the left to explain the patient's pain.  I suspect the pain is MSK in nature.  I have given toradol and flomax, urine is not infected.  Strainer provided.  Strain all urine and follow up with urology in one week for ongoing care.  Follow up with your pMD for ongoing symptoms.    Patient has a t max  here of 100.2 but urine is not infected no signs of pyelonephritis on CT scan  307 case d/w Dr. Junious Silk of urology.  No indication for antibiotics as this time.  Hydrate well and follow up in the office.   EBANY BOWERMASTER was evaluated in Emergency Department on 11/17/2020 for the symptoms described in the history of present illness. She was evaluated in the context of the global COVID-19 pandemic, which necessitated consideration that the patient might be at risk for infection with the SARS-CoV-2 virus that  causes COVID-19. Institutional protocols and algorithms that pertain to the evaluation of patients at risk for COVID-19 are in a state of rapid change based on information released by regulatory bodies including the CDC and federal and state organizations. These policies and algorithms were followed during the patient's care in the ED.  Final Clinical Impression(s) / ED Diagnoses Return for intractable cough, coughing up blood,fevers >100.4 unrelieved by medication, shortness of breath, intractable vomiting, chest pain, shortness of breath, weakness,numbness, changes in speech, facial asymmetry,abdominal pain, passing out,Inability to tolerate liquids or food, cough, altered mental status or any concerns. No signs of systemic illness or infection. The patient is nontoxic-appearing on exam and vital signs are within normal limits.   I have reviewed the triage vital signs and the nursing notes. Pertinent labs &imaging results that were available during my care of the patient were reviewed by me and considered in my medical decision making (see chart for details).After history, exam, and medical workup I feel the patient has beenappropriately medically screened and is safe for discharge home. Pertinent diagnoses were discussed with the patient. Patient was given return precautions.    Melodee Lupe, MD 11/17/20 [place], Arretta Toenjes, MD 11/17/20 1410

## 2021-01-17 ENCOUNTER — Ambulatory Visit: Payer: 59 | Admitting: Family Medicine

## 2021-01-22 ENCOUNTER — Ambulatory Visit (INDEPENDENT_AMBULATORY_CARE_PROVIDER_SITE_OTHER): Payer: 59 | Admitting: Family Medicine

## 2021-01-22 ENCOUNTER — Other Ambulatory Visit: Payer: Self-pay

## 2021-01-22 ENCOUNTER — Encounter: Payer: Self-pay | Admitting: Family Medicine

## 2021-01-22 VITALS — BP 112/80 | HR 95 | Temp 98.2°F | Resp 16 | Ht 67.0 in | Wt 231.8 lb

## 2021-01-22 DIAGNOSIS — F4322 Adjustment disorder with anxiety: Secondary | ICD-10-CM

## 2021-01-22 DIAGNOSIS — Z90710 Acquired absence of both cervix and uterus: Secondary | ICD-10-CM | POA: Diagnosis not present

## 2021-01-22 DIAGNOSIS — Z23 Encounter for immunization: Secondary | ICD-10-CM

## 2021-01-22 DIAGNOSIS — D72829 Elevated white blood cell count, unspecified: Secondary | ICD-10-CM | POA: Diagnosis not present

## 2021-01-22 DIAGNOSIS — Z803 Family history of malignant neoplasm of breast: Secondary | ICD-10-CM

## 2021-01-22 DIAGNOSIS — E669 Obesity, unspecified: Secondary | ICD-10-CM

## 2021-01-22 DIAGNOSIS — R7303 Prediabetes: Secondary | ICD-10-CM | POA: Diagnosis not present

## 2021-01-22 DIAGNOSIS — N200 Calculus of kidney: Secondary | ICD-10-CM | POA: Diagnosis not present

## 2021-01-22 HISTORY — DX: Adjustment disorder with anxiety: F43.22

## 2021-01-22 HISTORY — DX: Calculus of kidney: N20.0

## 2021-01-22 LAB — POCT GLYCOSYLATED HEMOGLOBIN (HGB A1C): Hemoglobin A1C: 5.3 % (ref 4.0–5.6)

## 2021-01-22 NOTE — Patient Instructions (Signed)
Please return in June 2022 for your annual complete physical; please come fasting.  Today you were given your Tdap vaccination. This is good for 10 years.  It was a pleasure meeting you today! Thank you for choosing [facility] to meet your healthcare needs! I truly look forward to working with you. If you have any questions or concerns, please send me a message via Mychart or call the office at [phone].

## 2021-01-22 NOTE — Progress Notes (Signed)
Subjective  CC:  Chief Complaint  Patient presents with  . New Patient (Initial Visit)    Patient previously seen at [facility]  . prediabetic    Diagnosed a few years back, last A1C 10/2019  . Health Maintenance    Tdap given in office today     HPI: Christina Mora is a 50 y.o. female who presents to [place] [place] [facility] today to establish care with me as a new patient.  Reviewed records dating back to 2013.   She has the following concerns or needs:  Very pleasant 50 year old female, mother of 70 including 1 biologic daughter, 3 adopted children and 1 stepchild.  All are grown and out of the house.  She is in a current relationship.  She lives alone.  She works full-time and is Engineer, structural in 2 daycare centers.  Work is very stressful at this time due to [employer] issues and stressed parents.  She manages well overall.  At times she takes an occasional lorazepam due to anxiety induced from work stressors.  She does not suffer from chronic anxiety nor has ever needed mood medications.  No history of depression.  Recently was diagnosed with kidney stones.  She has had 2 episodes, passing stones with the treatment of fluid and Flomax.  No history of recurrent UTI or kidney problems.  I reviewed notes from ER, recent lab results and imaging studies including a CT showing a 2 mm renal stone.  Chronic leukocytosis dating back to 2015.  She does not know the etiology and has not seem to be evaluated in the past.  Has been stable.  She has no symptoms.  She is a non-smoker.  She has a strong family history of cancers including leukemias.  Family history of cancer: Her father was 1 of 74 children.  Most siblings have some type of cancer.  She reports colon cancer, breast cancer, lymphoma and leukemia.  She is unaware of any genetic testing has been done.  She gets mammograms at her GYN office.  She reports they are normal.  We need to get records.  History of fibroid uterus  with persistent menorrhagia with anemia eventually requiring hysterectomy with salpingectomy.  Ovaries were left intact.  She does not have perimenopausal symptoms.  Cologuard was negative when recently checked.  Mildly elevated A1c several years back.  She is working on diet and weight loss.  She feels her weight is higher than she did like.  Little exercise due to remaining pretty busy with work.  Assessment  1. Pre-diabetes   2. Leukocytosis, unspecified type   3. Nephrolithiasis   4. Status post total abdominal hysterectomy   5. Family history of breast cancer   6. Adjustment reaction with anxiety   7. Obesity (BMI 30-39.9)      Plan   History of prediabetes: Improved by today's lab work.  Discussed appropriate diet to prevent abnormalities with glucose metabolism.  Recommend daily exercise and weight loss.  Counseling done.  All questions answered.  Chronic leukocytosis: We will recheck at next lab work and if persists, check peripheral smear.  If needed recommend hematologic evaluation to ensure no other abnormalities.  Nephrolithiasis: Supportive care if recurs.  Will monitor for frequency of symptoms.  History of abdominal hysterectomy and salpingectomy: No further cervical cancer screenings needed.  Will monitor for perimenopausal symptoms.  Strong family history of cancer including breast cancers.  Ensure all screens are up-to-date.  Can  consider genetic referral in the future.  Patient declines currently.  Adjustment reaction with anxiety due to work stressors.  Intermittent Xanax or lorazepam use noted.  Will monitor.  Currently stable.  Obesity: Discussed diet changes and strength training.  Updated immunizations today.  Tdap  Follow up: June for complete physical Orders Placed This Encounter  Procedures  . Tdap vaccine greater than or equal to 7yo IM  . POCT HgB A1C   No orders of the defined types were placed in this encounter.    Depression screen [facility] 2/9  04/27/2019 11/02/2018 10/29/2017 07/29/2017 07/11/2017  Decreased Interest 0 0 0 0 0  Down, Depressed, Hopeless 0 0 0 0 0  PHQ - 2 Score 0 0 0 0 0    We updated and reviewed the patient's past history in detail and it is documented below.  Patient Active Problem List   Diagnosis Date Noted  . Leukocytosis 01/22/2021    Persistent since 2015. ? If ever evaluated   . Nephrolithiasis 01/22/2021  . Family history of breast cancer 01/22/2021  . Adjustment reaction with anxiety 01/22/2021  . Obesity (BMI 30-39.9) 01/22/2021  . Cervical radiculopathy 12/03/2019  . Chronic allergic rhinitis 05/17/2014   Health Maintenance  Topic Date Due  . Hepatitis C Screening  Never done  . MAMMOGRAM  04/29/2021  . TETANUS/TDAP  01/22/2031  . INFLUENZA VACCINE  Completed  . COVID-19 Vaccine  Completed  . HIV Screening  Completed  . PAP SMEAR-Modifier  Discontinued   Immunization History  Administered Date(s) Administered  . Influenza Inj Mdck Quad Pf 09/19/2018  . Influenza,inj,Quad PF,6+ Mos 01/26/2016, 10/29/2017, 10/10/2019  . Influenza-Unspecified 10/02/2018, 11/28/2020  . PFIZER(Purple Top)SARS-COV-2 Vaccination 03/04/2020, 04/07/2020, 10/29/2020  . Tdap 12/30/2010, 01/22/2021   No outpatient medications have been marked as taking for the 01/22/21 encounter (Office Visit) with Leamon Arnt, MD.    Allergies: Patient is allergic to robaxin [methocarbamol]. Past Medical History Patient  has a past medical history of Allergy, Elevated WBC count (2017), Fibroids, Nephrolithiasis (01/22/2021), Pre-diabetes, and SVD (spontaneous vaginal delivery). Past Surgical History Patient  has a past surgical history that includes Fracture surgery; Eye surgery (Bilateral); Dilation and curettage of uterus; Hysterectomy abdominal with salpingectomy (Bilateral, 09/24/2016); and Abdominal hysterectomy (09/24/2016). Family History: Patient family history includes Breast cancer in her paternal aunt, paternal  aunt, and paternal aunt; COPD in her mother; Cancer in her paternal grandmother; Epilepsy in her mother; Heart attack in her mother; Heart disease in her mother; Multiple sclerosis in her maternal grandmother; Obesity in her mother; Stroke in her mother. Social History:  Patient  reports that she has never smoked. She has never used smokeless tobacco. She reports current alcohol use. She reports that she does not use drugs.  Review of Systems: Constitutional: negative for fever or malaise Ophthalmic: negative for photophobia, double vision or loss of vision Cardiovascular: negative for chest pain, dyspnea on exertion, or new LE swelling Respiratory: negative for SOB or persistent cough Gastrointestinal: negative for abdominal pain, change in bowel habits or melena Genitourinary: negative for dysuria or gross hematuria Musculoskeletal: negative for new gait disturbance or muscular weakness Integumentary: negative for new or persistent rashes Neurological: negative for TIA or stroke symptoms Psychiatric: negative for SI or delusions Allergic/Immunologic: negative for hives  Patient Care Team    Relationship Specialty Notifications Start End  Leamon Arnt, MD PCP - General Family Medicine  01/22/21   Leamon Arnt, MD Consulting Physician Family Medicine  01/22/21 01/22/21  Suella Broad, MD Consulting Physician Physical Medicine and Rehabilitation  01/22/21     Objective  Vitals: BP 112/80   Pulse 95   Temp 98.2 F (36.8 C) (Temporal)   Resp 16   Ht 5' 7"  (1.702 m)   Wt 231 lb 12.8 oz (105.1 kg)   LMP 08/23/2016   SpO2 97%   BMI 36.31 kg/m  General:  Well developed, well nourished, no acute distress  Psych:  Alert and oriented,normal mood and affect HEENT:  Normocephalic, atraumatic, non-icteric sclera, supple neck without adenopathy, mass or thyromegaly Cardiovascular:  RRR without gallop, rub or murmur: No lower extremity edema Respiratory:  Good breath sounds bilaterally,  CTAB with normal respiratory effort Neurologic:    Mental status is normal. Gross motor and sensory exams are normal. Normal gait   Commons side effects, risks, benefits, and alternatives for medications and treatment plan prescribed today were discussed, and the patient expressed understanding of the given instructions. Patient is instructed to call or message via MyChart if he/she has any questions or concerns regarding our treatment plan. No barriers to understanding were identified. We discussed Red Flag symptoms and signs in detail. Patient expressed understanding regarding what to do in case of urgent or emergency type symptoms.   Medication list was reconciled, printed and provided to the patient in AVS. Patient instructions and summary information was reviewed with the patient as documented in the AVS. This note was prepared with assistance of Dragon voice recognition software. Occasional wrong-word or sound-a-like substitutions may have occurred due to the inherent limitations of voice recognition software  This visit occurred during the SARS-CoV-2 public health emergency.  Safety protocols were in place, including screening questions prior to the visit, additional usage of staff PPE, and extensive cleaning of exam room while observing appropriate contact time as indicated for disinfecting solutions.

## 2021-05-08 ENCOUNTER — Telehealth: Payer: Self-pay

## 2021-05-08 NOTE — Telephone Encounter (Signed)
FYI, will send triage note also once received.

## 2021-05-08 NOTE — Telephone Encounter (Signed)
Patient is calling in stating that she thinks she may have had a stomach virus this past weekend. Vomiting, Diarrhea, fever of 102, and weakness. Christina Mora states that she is still feeling really bad without the vomiting, and diarrhea. Offered patient a virtual visit, but declined stating that the fact that I offered the virtual appointment shows that we are slacking in the healthcare system. Then Christina Mora went on to talk more in depth about her symptoms when she got up to go to the bathroom she would feel light headed, and is hardly able to get out of the bed. Had patient speak with triage, currently waiting on note.

## 2021-05-08 NOTE — Telephone Encounter (Signed)
FYI.. Patient chose to try working on hydrating before scheduling an appointment.

## 2021-05-08 NOTE — Telephone Encounter (Signed)
Please get patient scheduled for an OV tomorrow.

## 2021-05-08 NOTE — Telephone Encounter (Signed)
Nurse Assessment Nurse: Ysidro Evert, RN, Levada Dy Date/Time (Eastern Time): 05/08/2021 10:43:20 AM Confirm and document reason for call. If symptomatic, describe symptoms. ---Caller states she is feeling weak and fatigued since having a stomach virus over the weekend. No fever this morning Does the patient have any new or worsening symptoms? ---Yes Will a triage be completed? ---Yes Related visit to physician within the last 2 weeks? ---No Does the PT have any chronic conditions? (i.e. diabetes, asthma, this includes High risk factors for pregnancy, etc.) ---No Is the patient pregnant or possibly pregnant? (Ask all females between the ages of 76-55) ---No Is this a behavioral health or substance abuse call? ---No Guidelines Guideline Title Affirmed Question Affirmed Notes Nurse Date/Time (Eastern Time) Weakness (Generalized) and Fatigue Poor fluid intake probably caused the weakness Ysidro Evert, RN, Levada Dy 05/08/2021 10:46:06 AM PLEASE NOTE: All timestamps contained within this report are represented as [place] Standard Time. CONFIDENTIALTY NOTICE: This fax transmission is intended only for the addressee. It contains information that is legally privileged, confidential or otherwise protected from use or disclosure. If you are not the intended recipient, you are strictly prohibited from reviewing, disclosing, copying using or disseminating any of this information or taking any action in reliance on or regarding this information. If you have received this fax in error, please notify us immediately by telephone so that we can arrange for its return to [place]. Phone: [phone], Toll-Free: [phone], Fax: [phone] Page: 2 of 2 Call Id: [id number] [place]. Time Eilene Ghazi Time) Disposition Final User 05/08/2021 10:53:59 AM Home Care Yes Ysidro Evert, RN, Marin Shutter Disagree/Comply Comply Caller Understands Yes PreDisposition Did not know what to do Care Advice Given Per Guideline HOME CARE: * You  should be able to treat this at home. REASSURANCE AND EDUCATION: FLUIDS: * Drink several glasses of fruit juice, other clear fluids or water. * This will improve hydration and blood glucose. * Not drinking enough fluids and being a little dehydrated is a common cause of mild weakness. * Vomiting and diarrhea can lead to dehydration. REST: * Lie down with feet elevated for 1 hour. CALL BACK IF: * Still feeling weak after 2 hours of rest and fluids * You become worse CARE ADVICE given per Weakness and Fatigue (Adult) guideline. * This will improve circulation and increase blood flow to the brain

## 2021-05-10 LAB — HM MAMMOGRAPHY

## 2021-05-14 ENCOUNTER — Encounter: Payer: Self-pay | Admitting: Family Medicine

## 2021-07-24 ENCOUNTER — Encounter: Payer: 59 | Admitting: Family Medicine

## 2021-07-24 ENCOUNTER — Ambulatory Visit (INDEPENDENT_AMBULATORY_CARE_PROVIDER_SITE_OTHER): Payer: 59 | Admitting: Family Medicine

## 2021-07-24 ENCOUNTER — Encounter: Payer: Self-pay | Admitting: Family Medicine

## 2021-07-24 ENCOUNTER — Other Ambulatory Visit: Payer: Self-pay

## 2021-07-24 VITALS — BP 116/72 | HR 73 | Temp 98.0°F | Resp 16 | Ht 67.0 in | Wt 232.8 lb

## 2021-07-24 DIAGNOSIS — D72829 Elevated white blood cell count, unspecified: Secondary | ICD-10-CM

## 2021-07-24 DIAGNOSIS — Z0000 Encounter for general adult medical examination without abnormal findings: Secondary | ICD-10-CM

## 2021-07-24 DIAGNOSIS — R590 Localized enlarged lymph nodes: Secondary | ICD-10-CM

## 2021-07-24 DIAGNOSIS — E669 Obesity, unspecified: Secondary | ICD-10-CM

## 2021-07-24 LAB — CBC WITH DIFFERENTIAL/PLATELET
Basophils Absolute: 0.1 10*3/uL (ref 0.0–0.1)
Basophils Relative: 0.8 % (ref 0.0–3.0)
Eosinophils Absolute: 0.1 10*3/uL (ref 0.0–0.7)
Eosinophils Relative: 0.9 % (ref 0.0–5.0)
HCT: 38.3 % (ref 36.0–46.0)
Hemoglobin: 12.4 g/dL (ref 12.0–15.0)
Lymphocytes Relative: 25.5 % (ref 12.0–46.0)
Lymphs Abs: 2.5 10*3/uL (ref 0.7–4.0)
MCHC: 32.2 g/dL (ref 30.0–36.0)
MCV: 88.4 fl (ref 78.0–100.0)
Monocytes Absolute: 0.4 10*3/uL (ref 0.1–1.0)
Monocytes Relative: 4.5 % (ref 3.0–12.0)
Neutro Abs: 6.6 10*3/uL (ref 1.4–7.7)
Neutrophils Relative %: 68.3 % (ref 43.0–77.0)
Platelets: 371 10*3/uL (ref 150.0–400.0)
RBC: 4.34 Mil/uL (ref 3.87–5.11)
RDW: 13.4 % (ref 11.5–15.5)
WBC: 9.7 10*3/uL (ref 4.0–10.5)

## 2021-07-24 LAB — LIPID PANEL
Cholesterol: 203 mg/dL — ABNORMAL HIGH (ref 0–200)
HDL: 33.8 mg/dL — ABNORMAL LOW (ref >39.00)
LDL Cholesterol: 133 mg/dL — ABNORMAL HIGH (ref 0–99)
NonHDL: 169.4
Total CHOL/HDL Ratio: 6
Triglycerides: 180 mg/dL — ABNORMAL HIGH (ref 0.0–149.0)
VLDL: 36 mg/dL (ref 0.0–40.0)

## 2021-07-24 LAB — COMPREHENSIVE METABOLIC PANEL
ALT: 14 U/L (ref 0–35)
AST: 16 U/L (ref 0–37)
Albumin: 4.2 g/dL (ref 3.5–5.2)
Alkaline Phosphatase: 70 U/L (ref 39–117)
BUN: 14 mg/dL (ref 6–23)
CO2: 25 mEq/L (ref 19–32)
Calcium: 8.9 mg/dL (ref 8.4–10.5)
Chloride: 103 mEq/L (ref 96–112)
Creatinine, Ser: 0.83 mg/dL (ref 0.40–1.20)
GFR: 82.96 mL/min (ref >60.00)
Glucose, Bld: 102 mg/dL — ABNORMAL HIGH (ref 70–99)
Potassium: 3.8 mEq/L (ref 3.5–5.1)
Sodium: 136 mEq/L (ref 135–145)
Total Bilirubin: 0.3 mg/dL (ref 0.2–1.2)
Total Protein: 7.1 g/dL (ref 6.0–8.3)

## 2021-07-24 LAB — HEMOGLOBIN A1C: Hgb A1c MFr Bld: 5.8 % (ref 4.6–6.5)

## 2021-07-24 NOTE — Progress Notes (Signed)
Subjective  Chief Complaint  Patient presents with   Annual Exam    Fasting    Jaw Pain    Left side, popping and pain    HPI: Christina Mora is a 50 y.o. female who presents to [facility] today for a Female Wellness Visit. She also has the concerns and/or needs as listed above in the chief complaint. These will be addressed in addition to the Health Maintenance Visit.   Wellness Visit: annual visit with health maintenance review and exam without Pap  Health maintenance: Patient status post hysterectomy.  Mammogram is up-to-date.  Cologuard up-to-date and negative.  Immunizations are up-to-date. Chronic disease f/u and/or acute problem visit: (deemed necessary to be done in addition to the wellness visit): Obesity: She is noted steadily trending upwards over the last several years.  She eats plenty of fruits and veggies.  She states she is a large quantity eater.  Perhaps getting to many calories.  Never exercises.  Has a sedentary job. Chronic leukocytosis: No malaise, fevers or night sweats. Complains of left upper neck and jaw pain with full jaw opening.  Has been going on for about the last week.  Hurts when she yawns or opens her mouth widely.  Pain is sharp and severe.  Does not radiate.  Denies ear pain, TMJ symptoms, sore throat, drainage or neck pain.  She does report that 2 months ago she had a spider bite that caused significant swelling in the neck but that was on the right side and seems to have resolved.  Denies fevers or chills.   Assessment  1. Annual physical exam   2. Leukocytosis, unspecified type   3. Obesity (BMI 30-39.9)   4. Left cervical lymphadenopathy   5. Adjustment reaction with anxiety      Plan  Female Wellness Visit: Age appropriate Health Maintenance and Prevention measures were discussed with patient. Included topics are cancer screening recommendations, ways to keep healthy (see AVS) including dietary and exercise  recommendations, regular eye and dental care, use of seat belts, and avoidance of moderate alcohol use and tobacco use.  BMI: discussed patient's BMI and encouraged positive lifestyle modifications to help get to or maintain a target BMI. HM needs and immunizations were addressed and ordered. See below for orders. See HM and immunization section for updates. Routine labs and screening tests ordered including cmp, cbc and lipids where appropriate. Discussed recommendations regarding Vit D and calcium supplementation (see AVS)  Chronic disease management visit and/or acute problem visit: Leukocytosis: Check CBC and peripheral smear.  He has family history of leukemia and lymphomas.  Will refer to hematology if needed. Left neck pain: Clinical exam most consistent with cervical reactive lymphadenopathy.  Trial of anti-inflammatory and recheck in 4 to 8 weeks.  If persists, neck CT.  Patient understands and agrees with care plan. Obesity: Discussed weight gain.  Discussed diet in detail.  Recommend monitoring caloric intake.  She likely needs to restrict some.  Discussed healthy choices.  Strength training and regular exercise also recommended.  Follow up: Return in about 1 year (around 07/24/2022) for complete physical.  Orders Placed This Encounter  Procedures   CBC with Differential/Platelet   Comprehensive metabolic panel   Hemoglobin A1c   Lipid panel   Pathologist smear review   Hepatitis C antibody   No orders of the defined types were placed in this encounter.     Body mass index is 36.46 kg/m. Wt Readings from  Last 3 Encounters:  07/24/21 232 lb 12.8 oz (105.6 kg)  01/22/21 231 lb 12.8 oz (105.1 kg)  11/16/20 220 lb (99.8 kg)     Patient Active Problem List   Diagnosis Date Noted   Family history of breast cancer 01/22/2021    Priority: High   Leukocytosis 01/22/2021    Priority: Medium    Persistent since 2015. ? If ever evaluated     Nephrolithiasis 01/22/2021     Priority: Medium   Obesity (BMI 30-39.9) 01/22/2021    Priority: Medium   Chronic allergic rhinitis 05/17/2014    Priority: [place] Maintenance  Topic Date Due   Hepatitis C Screening  Never done   INFLUENZA VACCINE  07/30/2021   MAMMOGRAM  05/10/2022   TETANUS/TDAP  01/22/2031   COVID-19 Vaccine  Completed   HIV Screening  Completed   Pneumococcal Vaccine 37-77 Years old  Aged Out   HPV VACCINES  Aged Out   PAP SMEAR-Modifier  Discontinued   Immunization History  Administered Date(s) Administered   Influenza Inj Mdck Quad Pf 09/19/2018   Influenza,inj,Quad PF,6+ Mos 01/26/2016, 10/29/2017, 10/10/2019   Influenza-Unspecified 10/02/2018, 11/28/2020   PFIZER(Purple Top)SARS-COV-2 Vaccination 03/04/2020, 04/07/2020, 10/29/2020   Tdap 12/30/2010, 01/22/2021   We updated and reviewed the patient's past history in detail and it is documented below. Allergies: Patient is allergic to robaxin [methocarbamol]. Past Medical History Patient  has a past medical history of Adjustment reaction with anxiety (01/22/2021), Allergy, Elevated WBC count (2017), Fibroids, Nephrolithiasis (01/22/2021), Pre-diabetes, and SVD (spontaneous vaginal delivery). Past Surgical History Patient  has a past surgical history that includes Fracture surgery; Eye surgery (Bilateral); Dilation and curettage of uterus; Hysterectomy abdominal with salpingectomy (Bilateral, 09/24/2016); and Abdominal hysterectomy (09/24/2016). Family History: Patient family history includes Breast cancer in her paternal aunt, paternal aunt, and paternal aunt; COPD in her mother; Cancer in her paternal grandmother; Epilepsy in her mother; Heart attack in her mother; Heart disease in her mother; Multiple sclerosis in her maternal grandmother; Obesity in her mother; Stroke in her mother. Social History:  Patient  reports that she has never smoked. She has never used smokeless tobacco. She reports current alcohol use. She reports that she  does not use drugs.  Review of Systems: Constitutional: negative for fever or malaise Ophthalmic: negative for photophobia, double vision or loss of vision Cardiovascular: negative for chest pain, dyspnea on exertion, or new LE swelling Respiratory: negative for SOB or persistent cough Gastrointestinal: negative for abdominal pain, change in bowel habits or melena Genitourinary: negative for dysuria or gross hematuria, no abnormal uterine bleeding or disharge Musculoskeletal: negative for new gait disturbance or muscular weakness Integumentary: negative for new or persistent rashes, no breast lumps Neurological: negative for TIA or stroke symptoms Psychiatric: negative for SI or delusions Allergic/Immunologic: negative for hives  Patient Care Team    Relationship Specialty Notifications Start End  Leamon Arnt, MD PCP - General Family Medicine  01/22/21   Suella Broad, MD Consulting Physician Physical Medicine and Rehabilitation  01/22/21     Objective  Vitals: BP 116/72   Pulse 73   Temp 98 F (36.7 C) (Temporal)   Resp 16   Ht 5' 7"  (1.702 m)   Wt 232 lb 12.8 oz (105.6 kg)   LMP 08/23/2016   SpO2 98%   BMI 36.46 kg/m  General:  Well developed, well nourished, no acute distress  Psych:  Alert and orientedx3,normal mood and affect HEENT:  Normocephalic, atraumatic, non-icteric sclera,  supple neck without mass or thyromegaly, left submandibular tender lymph node or gland palpable.  Less than a centimeter.  Mobile.  No TMJ tenderness bilaterally, normal TMs, clear pharynx with large tonsils, nonerythematous  cardiovascular:  Normal S1, S2, RRR without gallop, rub or murmur Respiratory:  Good breath sounds bilaterally, CTAB with normal respiratory effort Gastrointestinal: normal bowel sounds, soft, non-tender, no noted masses. No HSM MSK: no deformities, contusions. Joints are without erythema or swelling.  Skin:  Warm, no rashes or suspicious lesions noted Neurologic:     Mental status is normal. CN 2-11 are normal. Gross motor and sensory exams are normal. Normal gait. No tremor Breast Exam: No mass, skin retraction or nipple discharge is appreciated in either breast. No axillary adenopathy. Fibrocystic changes are not noted   Commons side effects, risks, benefits, and alternatives for medications and treatment plan prescribed today were discussed, and the patient expressed understanding of the given instructions. Patient is instructed to call or message via MyChart if he/she has any questions or concerns regarding our treatment plan. No barriers to understanding were identified. We discussed Red Flag symptoms and signs in detail. Patient expressed understanding regarding what to do in case of urgent or emergency type symptoms.  Medication list was reconciled, printed and provided to the patient in AVS. Patient instructions and summary information was reviewed with the patient as documented in the AVS. This note was prepared with assistance of Dragon voice recognition software. Occasional wrong-word or sound-a-like substitutions may have occurred due to the inherent limitations of voice recognition software  This visit occurred during the SARS-CoV-2 public health emergency.  Safety protocols were in place, including screening questions prior to the visit, additional usage of staff PPE, and extensive cleaning of exam room while observing appropriate contact time as indicated for disinfecting solutions.

## 2021-07-24 NOTE — Patient Instructions (Signed)
Please return in 12 months for your annual complete physical; please come fasting. Sooner if your neck/jaw pain does not resolve.   Take advil 400-663m twice a day for 7-14 days.   I will release your lab results to you on your MyChart account with further instructions. Please reply with any questions.    If you have any questions or concerns, please don't hesitate to send me a message via MyChart or call the office at [phone] Thank you for visiting with [email or web address]! It's our pleasure caring for you.   Please do these things to maintain good health!  Exercise at least 30-45 minutes a day,  4-5 days a week.  Eat a low-fat diet with lots of fruits and vegetables, up to 7-9 servings per day. Drink plenty of water daily. Try to drink 8 8oz glasses per day. Seatbelts can save your life. Always wear your seatbelt. Place Smoke Detectors on every level of your home and check batteries every year. Schedule an appointment with an eye doctor for an eye exam every 1-2 years Safe sex - use condoms to protect yourself from STDs if you could be exposed to these types of infections. Use birth control if you do not want to become pregnant and are sexually active. Avoid heavy alcohol use. If you drink, keep it to less than 2 drinks/day and not every day. [place]  Choose someone you trust that could speak for you if you became unable to speak for yourself. Depression is common in our stressful world.If you're feeling down or losing interest in things you normally enjoy, please come in for a visit. If anyone is threatening or hurting you, please get help. Physical or Emotional Violence is never OK.

## 2021-07-25 LAB — PATHOLOGIST SMEAR REVIEW

## 2021-07-25 LAB — HEPATITIS C ANTIBODY
Hepatitis C Ab: NONREACTIVE
SIGNAL TO CUT-OFF: 0.01 (ref <1.00)

## 2021-07-25 LAB — EXTRA LAV TOP TUBE

## 2021-12-28 IMAGING — CT CT RENAL STONE PROTOCOL
2 of 4 series · 16 of 46 positions shown, 18 images · non-contrast
Comparison: CT 07/19/2011

CLINICAL DATA: Bilateral flank and suprapubic pain for 2 months,
microhematuria

EXAM:
CT ABDOMEN AND PELVIS WITHOUT CONTRAST
TECHNIQUE: Multidetector CT imaging of the abdomen and pelvis was performed
following the standard protocol without IV contrast.

[Series 2: axial st · axial · 0.64mm/px · z∈[-552,-152]mm · 13 of 90 slices shown, 15 images]
[im 5/90  soft-tissue]
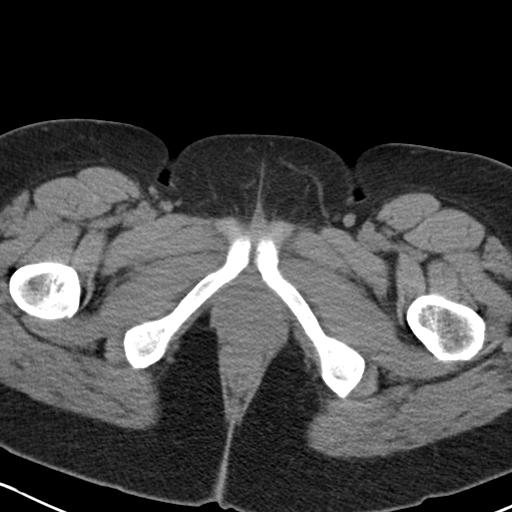
[im 5/90  bone]
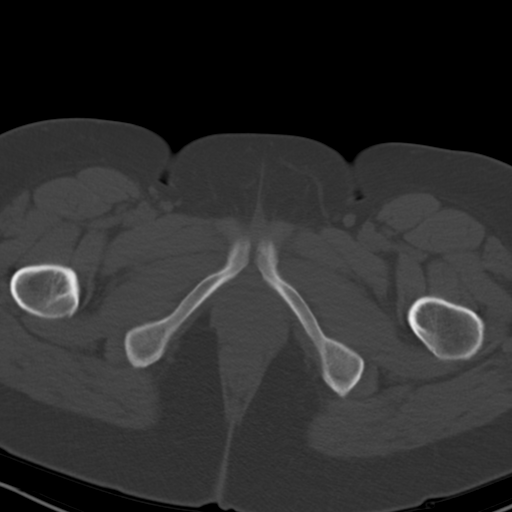
[im 14/90  soft-tissue]
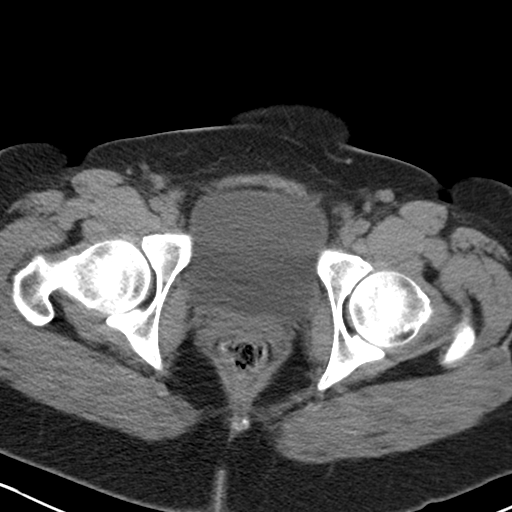
[im 18/90  soft-tissue]
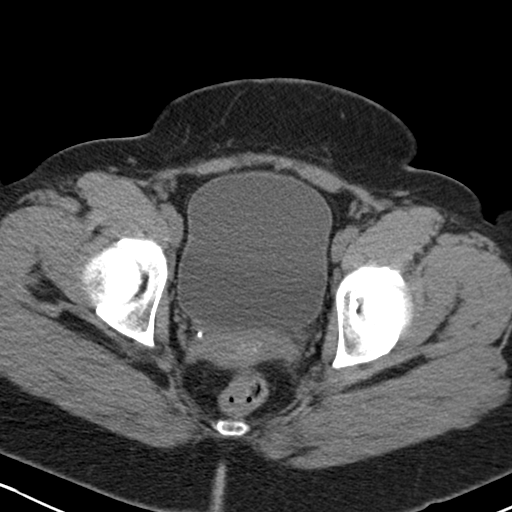
[im 27/90  soft-tissue]
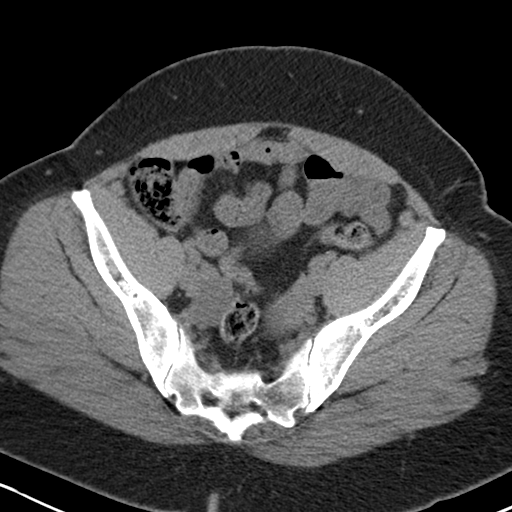
[im 32/90  soft-tissue]
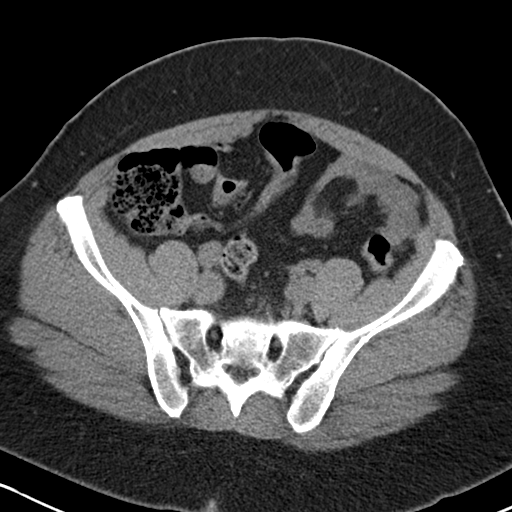
[im 41/90  soft-tissue]
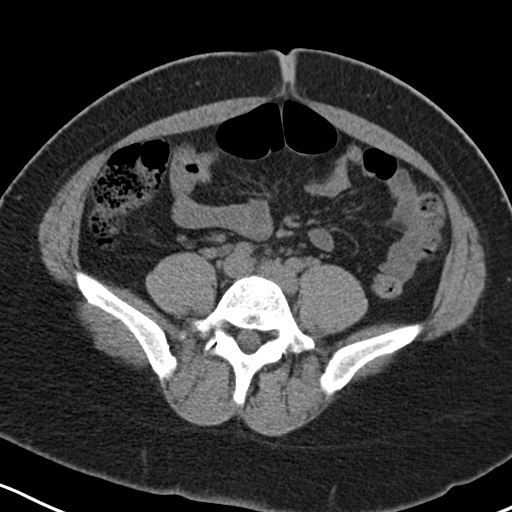
[im 45/90  soft-tissue]
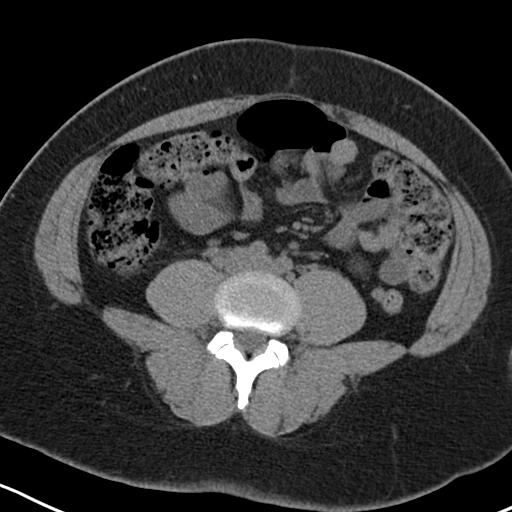
[im 49/90  soft-tissue]
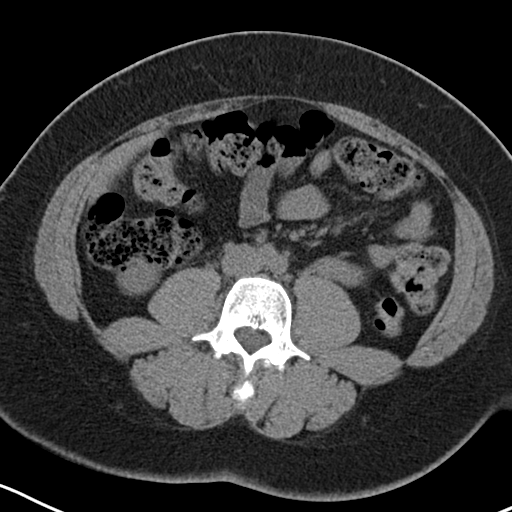
[im 58/90  soft-tissue]
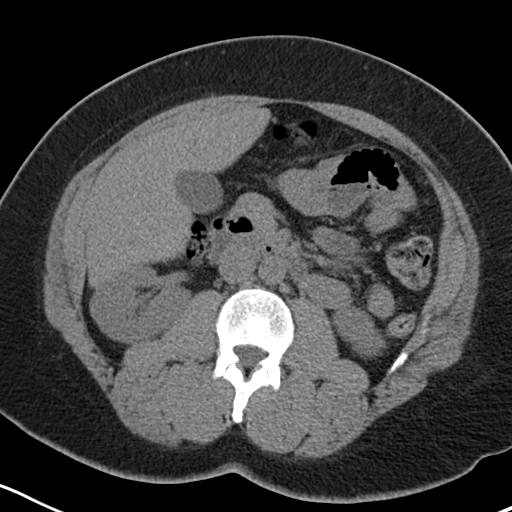
[im 58/90  bone]
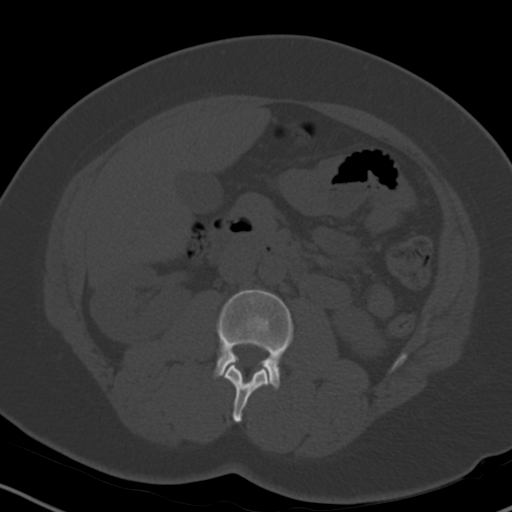
[im 63/90  soft-tissue]
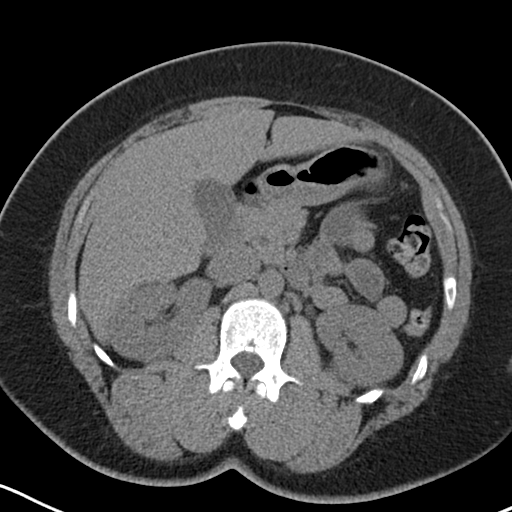
[im 72/90  soft-tissue]
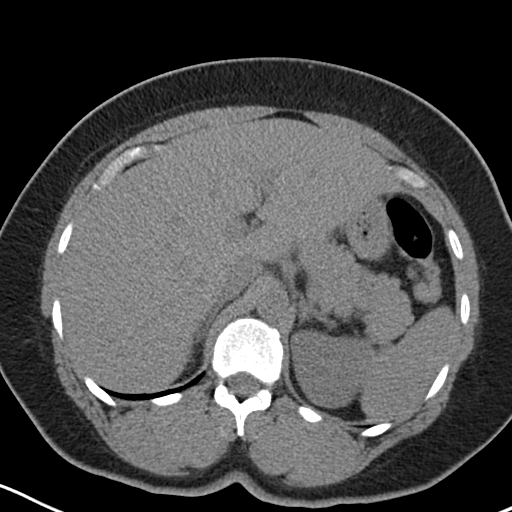
[im 76/90  soft-tissue]
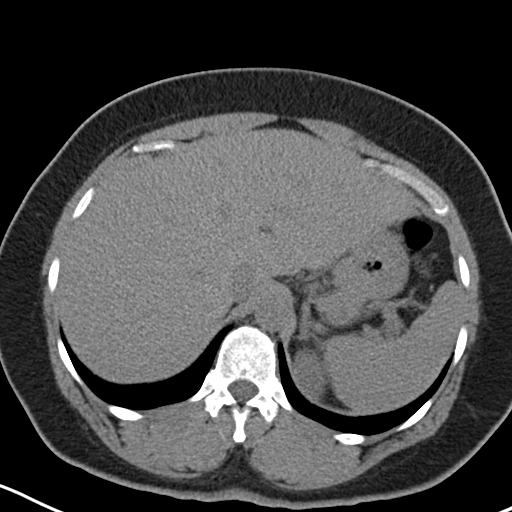
[im 85/90  soft-tissue]
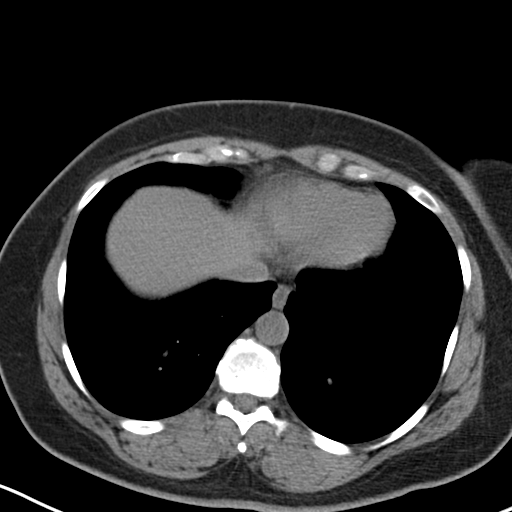

[Series 4: coronal · coronal · 0.75mm/px · 3 of 143 slices shown]
[im 48/143  soft-tissue]
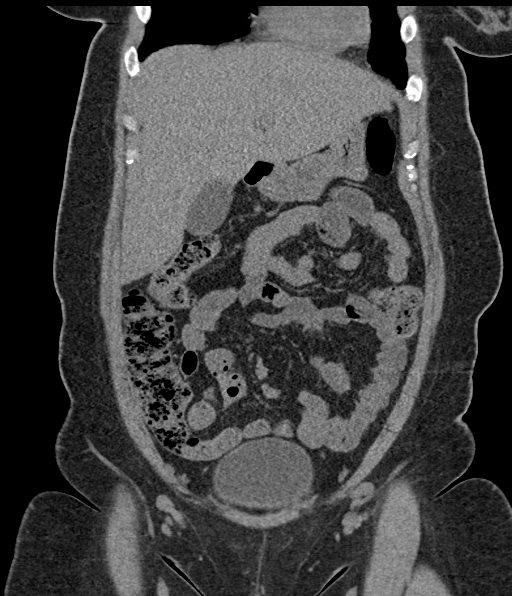
[im 64/143  soft-tissue]
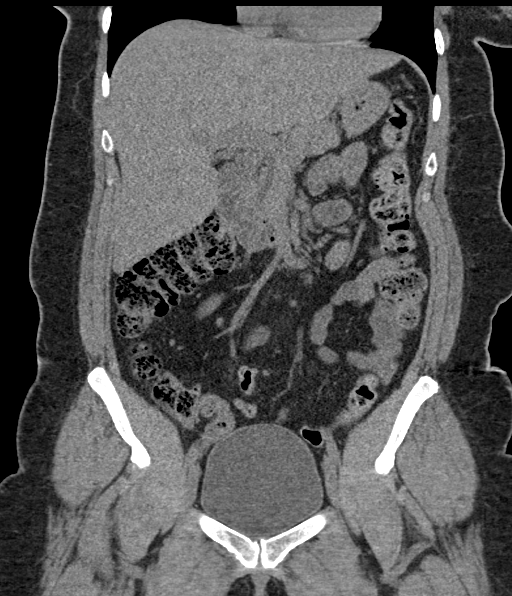
[im 79/143  soft-tissue]
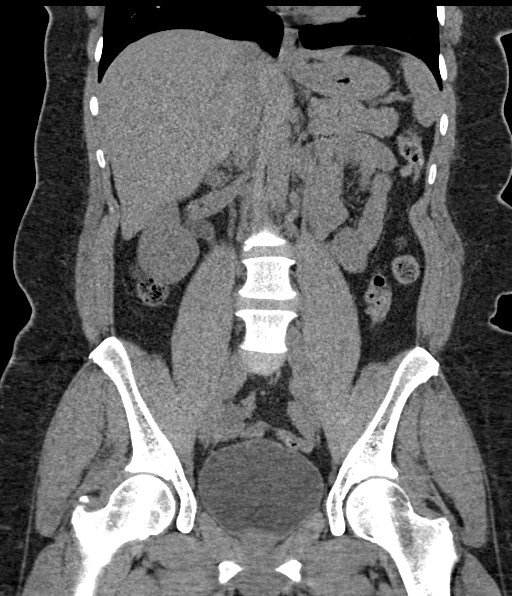

[16 of 46 positions shown; findings below may reference images not displayed]

FINDINGS: Lower chest: Lung bases are clear. Normal heart size. No pericardial
effusion.

Hepatobiliary: No visible focal liver lesions within the limitations
of this unenhanced CT. Smooth liver surface contour. Normal hepatic
attenuation. Normal gallbladder and biliary tree.

Pancreas: No pancreatic ductal dilatation or surrounding
inflammatory changes.

Spleen: Normal in size. No concerning splenic lesions.

Adrenals/Urinary Tract: Normal adrenal glands. Kidneys are symmetric
in size and normally located. No visible or concerning renal lesion.
Slight asymmetry of the right renal collecting system with a 2 mm
calculus in the vicinity of the right ureterovesicular junction
which is possibly associated with the distal ureter the difficult to
fully ascertain. No other visible urolithiasis. No left urinary
tract dilatation. Urinary bladder is unremarkable.

Stomach/Bowel: Distal esophagus and stomach are unremarkable. Few
air-filled duodenal diverticula are present about the level of the
pancreatic head and uncinate. Distal duodenum is unremarkable. No
small bowel thickening or dilatation. A normal appendix is
visualized. No colonic dilatation or wall thickening.

Vascular/Lymphatic: Minimal plaque seen in the left iliac artery. No
other significant vascular findings within limitations of this
unenhanced CT. No suspicious or enlarged lymph nodes in the included
lymphatic chains.

Reproductive: Prior hysterectomy. Retention of the ovaries with
normal follicles bilaterally.

Other: No abdominopelvic free fluid or free gas. No bowel containing
hernias.

Musculoskeletal: No acute osseous abnormality or suspicious osseous
lesion. Mild multilevel degenerative changes and Schmorl's node
formations are present in the imaged portions of the spine.
IMPRESSION: Mild asymmetric right ureterectasis with a 2 mm calculus in the
vicinity of the right ureterovesicular junction which could reflect
a small urolith.

No other acute abdominopelvic abnormality.

Prior hysterectomy.

## 2022-02-28 ENCOUNTER — Other Ambulatory Visit: Payer: Self-pay

## 2022-02-28 ENCOUNTER — Encounter: Payer: Self-pay | Admitting: Family Medicine

## 2022-02-28 ENCOUNTER — Ambulatory Visit: Payer: 59 | Admitting: Family Medicine

## 2022-02-28 VITALS — BP 124/80 | HR 78 | Temp 98.2°F | Ht 67.0 in | Wt 229.0 lb

## 2022-02-28 DIAGNOSIS — M25561 Pain in right knee: Secondary | ICD-10-CM | POA: Diagnosis not present

## 2022-02-28 DIAGNOSIS — M79674 Pain in right toe(s): Secondary | ICD-10-CM | POA: Diagnosis not present

## 2022-02-28 MED ORDER — FLUCONAZOLE 150 MG PO TABS: 150.0000 mg | ORAL_TABLET | Freq: Once | ORAL | 0 refills | Status: AC

## 2022-02-28 MED ORDER — DOXYCYCLINE HYCLATE 100 MG PO TABS: 100.0000 mg | ORAL_TABLET | Freq: Two times a day (BID) | ORAL | 0 refills | Status: DC

## 2022-02-28 NOTE — Patient Instructions (Signed)
It was very nice to see you today! ? ?Soaks, motrin for knee.  Antibiotics if worse, no change ? ? ?PLEASE NOTE: ? ?If you had any lab tests please let us know if you have not heard back within a few days. You may see your results on MyChart before we have a chance to review them but we will give you a call once they are reviewed by [place]. If we ordered any referrals today, please let us know if you have not heard from their office within the next week.  ? ?Please try these tips to maintain a healthy lifestyle: ? ?Eat most of your calories during the day when you are active. Eliminate processed foods including packaged sweets (pies, cakes, cookies), reduce intake of potatoes, white bread, white pasta, and white rice. Look for whole grain options, oat flour or almond flour. ? ?Each meal should contain half fruits/vegetables, one quarter protein, and one quarter carbs (no bigger than a computer mouse). ? ?Cut down on sweet beverages. This includes juice, soda, and sweet tea. Also watch fruit intake, though this is a healthier sweet option, it still contains natural sugar! Limit to 3 servings daily. ? ?Drink at least 1 glass of water with each meal and aim for at least 8 glasses per day ? ?Exercise at least 150 minutes every week.   ?

## 2022-02-28 NOTE — Progress Notes (Signed)
? ?Subjective:  ? ? ? Patient ID: Christina Mora, female    DOB: 03-02-1971, 51 y.o.   MRN: [id number] ? ?Chief Complaint  ?Patient presents with  ? Foot Pain  ?  Pain/buldge in back of red knee that started 2 weeks ago and between right big toe that started Monday ?Unable to walk at times  ? ? ?HPI-has Cerebral Palsy but did a lot of PT as child and better.  Can fall at times, twice in past 1 month, but no injury. ? ?2-3 wks ago, pain behind R knee-can be 10/10-w/certain movements-flexion or sideways.  Now mild pain down into shin/calf/thigh. No redness/swelling. No pain to palp.  "Deep".  No h/o DVT nor FH DVT.  No h/o gout.  No injury to knee.  Sits a lot. No long trips.  ? ?Reason that made her come today. Inside bottom R inner great toe-bulge  for 2 days.  Very tender to palp. Wakes her up if bumps it.  Last pm, "liquid pop" sensation R knee and bumped toe and bad pain. Not red/hot. No f/c.  ? ?There are no preventive care reminders to display for this patient. ? ?Past Medical History:  ?Diagnosis Date  ? Adjustment reaction with anxiety 01/22/2021  ? 2017-2018; situational  ? Allergy   ? Loratadine PRN  ? Elevated WBC count 2017  ? Fibroids   ? Nephrolithiasis 01/22/2021  ? Pre-diabetes   ? SVD (spontaneous vaginal delivery)   ? x 1  ? ? ?Past Surgical History:  ?Procedure Laterality Date  ? ABDOMINAL HYSTERECTOMY  09/24/2016  ? DILATION AND CURETTAGE OF UTERUS    ? x 4, 3 for heavy bleeding, 1 for MAB  ? EYE SURGERY Bilateral   ? as a child at age 89 or 4  ? FRACTURE SURGERY    ? R femur fracture age 76.  ? HYSTERECTOMY ABDOMINAL WITH SALPINGECTOMY Bilateral 09/24/2016  ? Procedure: HYSTERECTOMY ABDOMINAL WITH Bilateral SALPINGECTOMY with Lysis of Adhesions;  Surgeon: Nunzio Cobbs, MD;  Location: [facility];  Service: Gynecology;  Laterality: Bilateral;  3 hours  ? ? ?Outpatient Medications Prior to Visit  ?Medication Sig Dispense Refill  ? Loratadine (CLARITIN PO) Take 1 tablet by mouth daily.    ?  MELATONIN PO Take by mouth.    ? ?No facility-administered medications prior to visit.  ? ? ?Allergies  ?Allergen Reactions  ? Robaxin [Methocarbamol] Nausea And Vomiting  ? ?ROS neg/noncontributory except as noted HPI/below ? ? ?   ?Objective:  ?  ? ?BP 124/80   Pulse 78   Temp 98.2 ?F (36.8 ?C) (Temporal)   Ht 5' 7"  (1.702 m)   Wt 229 lb (103.9 kg)   LMP 08/23/2016   SpO2 98%   BMI 35.87 kg/m?  ?Wt Readings from Last 3 Encounters:  ?02/28/22 229 lb (103.9 kg)  ?07/24/21 232 lb 12.8 oz (105.6 kg)  ?01/22/21 231 lb 12.8 oz (105.1 kg)  ? ? ?   ? ?Gen: WDWN NAD OAAF ?HEENT: NCAT, conjunctiva not injected, sclera nonicteric ?CARDIAC: RRR, S1S2+, no murmur. DP 2+B ?LUNGS: CTAB. No wheezes ?EXT:  no edema ?MSK: no gross abnormalities.  ?Knee exam: R ?No deformity on inspection. ?min pain with palpation of suprapatellar area.  ?No effusion/swelling noted. ?FROM in flex/extension without crepitus. Some pain w/full flexion ?No popliteal fullness/tenderness. ?Neg drawer test. ?Neg mcmurray test. ?No pain with valgus/varus stress. ?No PFgrind. ?No abnormal patellar mobility.  ?No calf tenderness.  Neg Homan's.  No edema. No cord.  ?NEURO: A&O x3.  CN II-XII intact.  ?PSYCH: normal mood. Good eye contact ?R great toe-no redness/warmth pain to move it.  On lateral side of toe, above PIP-small fullnes. VERY tender to palp.  No red/not fluctuant, no vessicles.     Used 25 gauge needle for poke-only blood(verbal permission granted by pt.   ? ?Assessment & Plan:  ? ?Problem List Items Addressed This Visit   ?None ?Visit Diagnoses   ? ? Acute pain of right knee    -  Primary  ? Toe pain, right      ? ?  ? R knee pain-?OA/Baker cyst, ?meniscal tear, other.  Highly doubt clot.  NSAID's. Monitor.  Ortho if not improving or worse. ?Great toe pain-R.  ?infection, ?cyst.  Other.  Will do soaks, abx.  Worse, no change, new symptoms, f/u. ? ?No orders of the defined types were placed in this encounter. ? ? ?Wellington Hampshire, MD ? ?

## 2022-04-04 ENCOUNTER — Ambulatory Visit: Payer: 59 | Admitting: Family Medicine

## 2022-05-16 LAB — HM MAMMOGRAPHY

## 2022-05-21 ENCOUNTER — Encounter: Payer: Self-pay | Admitting: Family Medicine

## 2022-06-28 ENCOUNTER — Encounter: Payer: Self-pay | Admitting: Family

## 2022-06-28 ENCOUNTER — Ambulatory Visit (INDEPENDENT_AMBULATORY_CARE_PROVIDER_SITE_OTHER): Payer: 59 | Admitting: Family

## 2022-06-28 ENCOUNTER — Ambulatory Visit: Payer: 59 | Admitting: Family

## 2022-06-28 VITALS — BP 122/74 | HR 92 | Temp 97.9°F | Ht 67.0 in | Wt 234.2 lb

## 2022-06-28 DIAGNOSIS — N3090 Cystitis, unspecified without hematuria: Secondary | ICD-10-CM | POA: Diagnosis not present

## 2022-06-28 LAB — POCT URINALYSIS DIPSTICK
Bilirubin, UA: POSITIVE
Blood, UA: POSITIVE
Glucose, UA: POSITIVE — AB
Ketones, UA: POSITIVE
Nitrite, UA: POSITIVE
Protein, UA: NEGATIVE
Spec Grav, UA: 1.02 (ref 1.010–1.025)
Urobilinogen, UA: >=8 E.U./dL — AB
pH, UA: 5 (ref 5.0–8.0)

## 2022-06-28 MED ORDER — CEFTRIAXONE SODIUM 1 G IJ SOLR
1.0000 g | Freq: Once | INTRAMUSCULAR | Status: AC
  Administered 2022-06-28: 1 g via INTRAMUSCULAR

## 2022-06-28 NOTE — Patient Instructions (Signed)
It was very nice to see you today!    An antibiotic injection, Rocephin (Ceftriaxone), was given to you today. We are also sending your urine out for culture and hopefully the results will be back Monday. If a different antibiotic is indicated, we will send that to your pharmacy. We will call or notify you via MyChart if the urine culture indicates a different antibiotic to be used. Drink at least 2 liters = 64oz = 8 cups of water daily.     PLEASE NOTE:  If you had any lab tests please let us know if you have not heard back within a few days. You may see your results on MyChart before we have a chance to review them but we will give you a call once they are reviewed by [place]. If we ordered any referrals today, please let us know if you have not heard from their office within the next week.

## 2022-06-28 NOTE — Progress Notes (Signed)
Subjective:     Patient ID: Christina Mora, female    DOB: August 27, 1971, 51 y.o.   MRN: [id number]  Chief Complaint  Patient presents with   Urinary Frequency    Pt c/o bladder/pelvic pain, especially at the end of urination for about 4-7 days but as been getting worse. Has been taking a medication she use for kidney stones ( Pyridium) which does help her so she is able to move. Pt states it feels like a mass is coming out.    HPI: Urinary symptoms: Patient c/o  dysuria, frequency, pelvic pain, low back pain, foul odor, cloudy urine, hematuria. Other sx: vaginal d/c , vaginal itching . Duration of sx: 7 days; Home tx: Pyridium; Denies  nausea, fever. Reports last UTI never, just stones in past.    Assessment & Plan:   Problem List Items Addressed This Visit   None Visit Diagnoses     Cystitis    -  Primary pt reports not doing well with oral antibiotics, but the injection has worked well for her. Advised it is not first line tx for UTI and she may still need to take oral medication. Advised on SE. We are sending urine for culture and will contact her as soon as results are back.    Relevant Medications   cefTRIAXone (ROCEPHIN) injection 1 g (Start on 06/28/2022  1:45 PM)   Other Relevant Orders   POCT Urinalysis Dipstick (Completed)   Urine Culture      Outpatient Medications Prior to Visit  Medication Sig Dispense Refill   MELATONIN PO Take by mouth.     phenazopyridine (PYRIDIUM) 200 MG tablet Take 200 mg by mouth 3 (three) times daily as needed for pain.     doxycycline (VIBRA-TABS) 100 MG tablet Take 1 tablet (100 mg total) by mouth 2 (two) times daily. 14 tablet 0   Loratadine (CLARITIN PO) Take 1 tablet by mouth daily.     No facility-administered medications prior to visit.    Past Medical History:  Diagnosis Date   Adjustment reaction with anxiety 01/22/2021   2017-2018; situational   Allergy    Loratadine PRN   Elevated WBC count 2017   Fibroids     Nephrolithiasis 01/22/2021   Pre-diabetes    SVD (spontaneous vaginal delivery)    x 1    Past Surgical History:  Procedure Laterality Date   ABDOMINAL HYSTERECTOMY  09/24/2016   DILATION AND CURETTAGE OF UTERUS     x 4, 3 for heavy bleeding, 1 for MAB   EYE SURGERY Bilateral    as a child at age 88 or 4   FRACTURE SURGERY     R femur fracture age 13.   HYSTERECTOMY ABDOMINAL WITH SALPINGECTOMY Bilateral 09/24/2016   Procedure: HYSTERECTOMY ABDOMINAL WITH Bilateral SALPINGECTOMY with Lysis of Adhesions;  Surgeon: Nunzio Cobbs, MD;  Location: [facility];  Service: Gynecology;  Laterality: Bilateral;  3 hours    Allergies  Allergen Reactions   Robaxin [Methocarbamol] Nausea And Vomiting       Objective:    Physical Exam Vitals and nursing note reviewed.  Constitutional:      Appearance: Normal appearance.  Cardiovascular:     Rate and Rhythm: Normal rate and regular rhythm.  Pulmonary:     Effort: Pulmonary effort is normal.     Breath sounds: Normal breath sounds.  Musculoskeletal:        General: Normal range of motion.  Skin:  General: Skin is warm and dry.  Neurological:     Mental Status: She is alert.  Psychiatric:        Mood and Affect: Mood normal.        Behavior: Behavior normal.     BP 122/74 (BP Location: Left Arm, Patient Position: Sitting, Cuff Size: Large)   Pulse 92   Temp 97.9 F (36.6 C) (Temporal)   Ht 5' 7"  (1.702 m)   Wt 234 lb 4 oz (106.3 kg)   LMP 08/23/2016   SpO2 96%   BMI 36.69 kg/m  Wt Readings from Last 3 Encounters:  06/28/22 234 lb 4 oz (106.3 kg)  02/28/22 229 lb (103.9 kg)  07/24/21 232 lb 12.8 oz (105.6 kg)        Meds ordered this encounter  Medications   cefTRIAXone (ROCEPHIN) injection 1 g    Jeanie Sewer, NP

## 2022-06-29 LAB — URINE CULTURE
MICRO NUMBER:: 13594446
SPECIMEN QUALITY:: ADEQUATE

## 2022-07-01 ENCOUNTER — Telehealth: Payer: Self-pay | Admitting: Family Medicine

## 2022-07-01 NOTE — Telephone Encounter (Signed)
Pt was advised to go to ED.   Patient Name: Christina Mora Gender: Female DOB: [birth year] Age: 51 Y 17 D Return Phone Number: [phone] (Primary) Address: City/ State/ Zip: [place]  [address] Client [facility] Client Site [facility] Night Provider Billey Chang- MD Contact Type Call Who Is Calling Patient / Member / Family / Caregiver Call Type Triage / Clinical Relationship To Patient Self Return Phone Number [phone] (Primary) Chief Complaint SEVERE ABDOMINAL PAIN - Severe pain in abdomen Reason for Call Symptomatic / Request for [name] states she was seen in the office on Friday for a bladder infection. She was given a shot of antibiotic. She states she is worse that when she was there. She is taking her pain medication but it is not helping and she cannot sleep. She has severe abdominal pain and a headache. Her urinalysis showed a lot of bacteria and her doctor sent it off for a culture. [facility] No Nurse Assessment Nurse: Rosana Hoes, RN, Luvenia Starch Date/Time Eilene Ghazi Time): 06/29/2022 3:59:03 AM Confirm and document reason for call. If symptomatic, describe symptoms. ---Caller states she was seen in the office on Friday for a bladder infection, was given an antibiotic shot, abdominal pain 9/10, urinalysis showed bacteria and her doctor sent it off for a culture, taking otc pain medication a azo with no relief. Afebrile. Does the patient have any new or worsening symptoms? ---Yes Will a triage be completed? ---Yes Related visit to physician within the last 2 weeks? ---Yes Does the PT have any chronic conditions? (i.e. diabetes, asthma, this includes High risk factors for pregnancy, etc.) ---No Is the patient pregnant or possibly pregnant? (Ask all females between the ages of  30-55) ---No Is this a behavioral health or substance abuse call? ---No Guidelines Guideline Title Affirmed Question Affirmed Notes Nurse Date/Time (Eastern Time) Abdominal Pain - Female [1] SEVERE pain (e.g., excruciating) AND [2] present > 1 hour Rosana Hoes, RN, Luvenia Starch 06/29/2022 4:02:16 AM Disp. Time Eilene Ghazi Time) Disposition Final User 06/29/2022 3:56:34 AM Send to Urgent Queue Abel Presto 06/29/2022 4:05:04 AM Go to ED Now Yes Rosana Hoes, RN, Luvenia Starch Final Disposition 06/29/2022 4:05:04 AM Go to ED Now Yes Rosana Hoes, RN, Gus Puma Disagree/Comply Disagree Caller Understands Yes PreDisposition Did not know what to do Care Advice Given Per Guideline GO TO ED NOW: * You need to be seen in the Emergency Department. * Leave now. Drive carefully. BRING MEDICINES: * Do not eat or drink anything for now. NOTHING BY MOUTH: CARE ADVICE given per Abdominal Pain - Female (Adult) guideline. ANOTHER ADULT SHOULD DRIVE: * It is better and safer if another adult drives instead of you. * Bring a list of your current medicines when you go to the Emergency Department (ER). Comments User: [name], RN Date/Time Eilene Ghazi Time): 06/29/2022 4:04:49 AM Taking Pyridium with no relief. Referrals GO TO FACILITY OTHER - SPECIFY

## 2022-07-01 NOTE — Progress Notes (Signed)
Hi Christina Mora,  Your urine culture picked up superficial bacteria. If you are still having symptoms and want to recheck the urine, you can schedule a lab only appointment and be sure to cleanse with a wipe prior to urinating, allow stream to start into toilet, then collect a sample. This helps avoid getting that superficial bacteria into the urine.  But if you are feeling better, it is not necessary!

## 2022-07-03 ENCOUNTER — Telehealth: Payer: Self-pay | Admitting: Family Medicine

## 2022-07-03 ENCOUNTER — Ambulatory Visit (INDEPENDENT_AMBULATORY_CARE_PROVIDER_SITE_OTHER): Payer: 59 | Admitting: Family

## 2022-07-03 ENCOUNTER — Encounter: Payer: Self-pay | Admitting: Family

## 2022-07-03 VITALS — BP 122/84 | HR 102 | Temp 97.8°F | Ht 67.0 in | Wt 235.1 lb

## 2022-07-03 DIAGNOSIS — N3090 Cystitis, unspecified without hematuria: Secondary | ICD-10-CM | POA: Diagnosis not present

## 2022-07-03 LAB — POCT URINALYSIS DIPSTICK
Bilirubin, UA: NEGATIVE
Blood, UA: POSITIVE
Glucose, UA: NEGATIVE
Ketones, UA: NEGATIVE
Leukocytes, UA: NEGATIVE
Nitrite, UA: NEGATIVE
Protein, UA: NEGATIVE
Spec Grav, UA: 1.02 (ref 1.010–1.025)
Urobilinogen, UA: 0.2 E.U./dL
pH, UA: 6 (ref 5.0–8.0)

## 2022-07-03 NOTE — Telephone Encounter (Signed)
Pt came in for an appointment, will be bringing a urine specimen back. Pt was not able to give enough urine for a culture in office.

## 2022-07-03 NOTE — Telephone Encounter (Signed)
so per my Mychart note, she will need to drop off a urine specimen for UA testing and sending out for culture- and she needs to be reminded to do a clean catch urine specimen - as the culture indicated mixed bacteria, usually from either not wiping first or not urinating first and then catching urine mid-stream - thx

## 2022-07-03 NOTE — Progress Notes (Signed)
   Subjective:     Patient ID: Christina Mora, female    DOB: Mar 22, 1971, 51 y.o.   MRN: [id number]  Chief Complaint  Patient presents with   Follow-up    Cystitis, Pt states it did get better at one point but pain has came back but gradually.    HPI: Urinary symptoms: pt seen last week, chose to get Ceftriaxone injection as has SE with oral tabs, culture came back with mixed flora, advised pt to return and give a good clean catch specimen. Pt requesting another injection today as her sx had improved but are returning again, feeling mostly suprapubic pain, not with urination and constant, 5/10, feels at the end of her urination.   Assessment & Plan:   Problem List Items Addressed This Visit   None Visit Diagnoses     Cystitis    -  Primary pt UA negative today. Will send out for culture again to be sure, advised on needing clean catch specimen. pt having continued suprapubic pain and concerned UTI is returning.    Relevant Orders   POCT Urinalysis Dipstick (Completed)   Urine Culture      Outpatient Medications Prior to Visit  Medication Sig Dispense Refill   MELATONIN PO Take by mouth.     phenazopyridine (PYRIDIUM) 200 MG tablet Take 200 mg by mouth 3 (three) times daily as needed for pain. (Patient not taking: Reported on 07/03/2022)     No facility-administered medications prior to visit.    Past Medical History:  Diagnosis Date   Adjustment reaction with anxiety 01/22/2021   2017-2018; situational   Allergy    Loratadine PRN   Elevated WBC count 2017   Fibroids    Nephrolithiasis 01/22/2021   Pre-diabetes    SVD (spontaneous vaginal delivery)    x 1    Past Surgical History:  Procedure Laterality Date   ABDOMINAL HYSTERECTOMY  09/24/2016   DILATION AND CURETTAGE OF UTERUS     x 4, 3 for heavy bleeding, 1 for MAB   EYE SURGERY Bilateral    as a child at age 38 or 4   FRACTURE SURGERY     R femur fracture age 55.   HYSTERECTOMY ABDOMINAL WITH SALPINGECTOMY  Bilateral 09/24/2016   Procedure: HYSTERECTOMY ABDOMINAL WITH Bilateral SALPINGECTOMY with Lysis of Adhesions;  Surgeon: Nunzio Cobbs, MD;  Location: [facility];  Service: Gynecology;  Laterality: Bilateral;  3 hours    Allergies  Allergen Reactions   Robaxin [Methocarbamol] Nausea And Vomiting       Objective:    Physical Exam Vitals and nursing note reviewed.  Constitutional:      Appearance: Normal appearance.  Cardiovascular:     Rate and Rhythm: Normal rate and regular rhythm.  Pulmonary:     Effort: Pulmonary effort is normal.     Breath sounds: Normal breath sounds.  Musculoskeletal:        General: Normal range of motion.  Skin:    General: Skin is warm and dry.  Neurological:     Mental Status: She is alert.  Psychiatric:        Mood and Affect: Mood normal.        Behavior: Behavior normal.    LMP 08/23/2016  Wt Readings from Last 3 Encounters:  06/28/22 234 lb 4 oz (106.3 kg)  02/28/22 229 lb (103.9 kg)  07/24/21 232 lb 12.8 oz (105.6 kg)       Jeanie Sewer, NP

## 2022-07-03 NOTE — Telephone Encounter (Signed)
Pt is requesting a call back about results and has questions about the bladder infection.  Does the result of labs show that the previous injection treats the class of the bacteria that   Pt states she has experienced some relief but not completely - perhaps because of time?  Concerned about 1g leading to another infection. Needs a 2nd gram?    So:  #1 does she need a new prescription based on lab results?  #2 If correct antibiotic, can she have another shot or get more?    FORep scheduled pt for 1:00 with [facility], just in case  Pt had not seen [facility] 07/03 results note at the time of this phone call.

## 2022-07-03 NOTE — Telephone Encounter (Signed)
Pt scheduled for today with [facility].

## 2022-07-05 LAB — URINE CULTURE
MICRO NUMBER:: 13606735
SPECIMEN QUALITY:: ADEQUATE

## 2022-07-05 NOTE — Progress Notes (Signed)
Hi Christina Mora,  Your urine culture is negative for UTI. Hope you are feeling better.

## 2022-08-12 ENCOUNTER — Ambulatory Visit (INDEPENDENT_AMBULATORY_CARE_PROVIDER_SITE_OTHER): Payer: 59 | Admitting: Internal Medicine

## 2022-08-12 ENCOUNTER — Encounter: Payer: Self-pay | Admitting: Internal Medicine

## 2022-08-12 VITALS — BP 122/84 | HR 89 | Temp 98.2°F | Resp 14 | Ht 67.0 in | Wt 234.2 lb

## 2022-08-12 DIAGNOSIS — J020 Streptococcal pharyngitis: Secondary | ICD-10-CM | POA: Diagnosis not present

## 2022-08-12 DIAGNOSIS — J0300 Acute streptococcal tonsillitis, unspecified: Secondary | ICD-10-CM

## 2022-08-12 DIAGNOSIS — B3731 Acute candidiasis of vulva and vagina: Secondary | ICD-10-CM

## 2022-08-12 DIAGNOSIS — J029 Acute pharyngitis, unspecified: Secondary | ICD-10-CM

## 2022-08-12 LAB — POC COVID19 BINAXNOW: SARS Coronavirus 2 Ag: NEGATIVE

## 2022-08-12 LAB — POCT RAPID STREP A (OFFICE): Rapid Strep A Screen: POSITIVE — AB

## 2022-08-12 MED ORDER — MAGIC MOUTHWASH W/LIDOCAINE: 5.0000 mL | Freq: Three times a day (TID) | ORAL | 1 refills | Status: DC | PRN

## 2022-08-12 MED ORDER — AMOXICILLIN-POT CLAVULANATE 875-125 MG PO TABS: 1.0000 | ORAL_TABLET | Freq: Two times a day (BID) | ORAL | 0 refills | Status: DC

## 2022-08-12 MED ORDER — FLUCONAZOLE 150 MG PO TABS: 150.0000 mg | ORAL_TABLET | Freq: Once | ORAL | 0 refills | Status: AC

## 2022-08-12 MED ORDER — MAGIC MOUTHWASH W/LIDOCAINE: 5.0000 mL | Freq: Three times a day (TID) | ORAL | 1 refills | Status: AC | PRN

## 2022-08-12 NOTE — Patient Instructions (Addendum)

## 2022-08-12 NOTE — Addendum Note (Signed)
Addended by: Zacarias Pontes on: 08/12/2022 03:36 PM   Modules accepted: Orders

## 2022-08-12 NOTE — Progress Notes (Signed)
   Christina Mora is a 51 y.o. female who presents today for an office visit.  Assessment/Plan:  Overview: Strep throat classic pres with pos screen... but does have lower abd pain I'm not sure if its related. She gets yeast infections and amoxicillin not strong enough for her she reports.  Christina Mora was seen today for sore throat.  Strep pharyngitis Overview: Started on 08/09/22 with sorethroat,h/a, tiny cough, stomach ache, fatigue Denies subj fevers Minimal cough with it.  Reports tender left sided cervical adenopathy. Stomach started 08/11/20 down low.   Strep swab pos    Sore throat -     POCT rapid strep A -     POC COVID-19 BinaxNow  Vaginal candidiasis   Preventive diflucan given at patient request    Subjective:  HPI:  History taking was put in the overview section above      Objective:  Physical Exam: BP 122/84 (BP Location: Left Arm)   Pulse 89   Temp 98.2 F (36.8 C) (Temporal)   Resp 14   Ht '5\' 7"'$  (1.702 m)   Wt 234 lb 3.2 oz (106.2 kg)   LMP 08/23/2016   SpO2 97%   BMI 36.68 kg/m    Gen: No acute distress, resting comfortably Psych: Normal affect and thought content  Problem specific physical exam findings:  Very swollen blistering exudative tonsils grade 3 bilateral   Results for orders placed or performed in visit on 08/12/22  POCT rapid strep A  Result Value Ref Range   Rapid Strep A Screen Positive (A) Negative  POC COVID-19  Result Value Ref Range   SARS Coronavirus 2 Ag Negative Negative          Christina Pacas, MD 08/12/2022 1:59 PM

## 2022-08-16 ENCOUNTER — Telehealth: Payer: Self-pay | Admitting: Family Medicine

## 2022-08-16 NOTE — Telephone Encounter (Signed)
Patient states: - Following OV on 08/14 with Dr. Randol Kern, she began taking augementin as prescribed.  - While she still had symptoms from Monday-Wednesday, they were improving. - Thursday night she suddenly had a low grade fever of 100.1 and increased throat pain - Currently throat pain is at a 7 and her ears are starting to hurt.    Patient requests: - Recommendations from Dr. Randol Kern since she saw him last   I offered patient a OV with another location due to schedules being full but she declined.

## 2022-08-20 NOTE — Telephone Encounter (Signed)
Spoke with patient. She was very angry on 8/18 that she is just getting a call back today concerning her severe pain. She is feeling better now since going to the ER over the weekend and being prescribed prednisone along with the augmentin.

## 2022-09-23 ENCOUNTER — Encounter: Payer: Self-pay | Admitting: *Deleted

## 2022-12-05 ENCOUNTER — Encounter: Payer: Self-pay | Admitting: Family Medicine

## 2022-12-05 ENCOUNTER — Ambulatory Visit (INDEPENDENT_AMBULATORY_CARE_PROVIDER_SITE_OTHER): Payer: 59 | Admitting: Family Medicine

## 2022-12-05 VITALS — BP 111/76 | HR 82 | Temp 98.1°F | Ht 67.0 in | Wt 229.0 lb

## 2022-12-05 DIAGNOSIS — M25561 Pain in right knee: Secondary | ICD-10-CM

## 2022-12-05 DIAGNOSIS — G8929 Other chronic pain: Secondary | ICD-10-CM

## 2022-12-05 DIAGNOSIS — Z8781 Personal history of (healed) traumatic fracture: Secondary | ICD-10-CM | POA: Diagnosis not present

## 2022-12-05 MED ORDER — DICLOFENAC SODIUM 75 MG PO TBEC: 75.0000 mg | DELAYED_RELEASE_TABLET | Freq: Two times a day (BID) | ORAL | 2 refills | Status: DC | PRN

## 2022-12-05 NOTE — Progress Notes (Signed)
Subjective  CC:  Chief Complaint  Patient presents with   Arthritis    Pt stated that she has been having the Rt leg pain for the past yr and has gotten worse over time     HPI: Christina Mora is a 51 y.o. female who presents to the office today to address the problems listed above in the chief complaint. 51 yo c/o chronic right knee/leg pain: She reports she has pain above her right foot started over a year ago at least but now is daily and nagging.  Pain at rest, aching pain throbbing pain during the night interfering with sleep.  Range of motion is limited, increased pain with full flexion.  She feels pressure inside of the knee but denies swelling, redness or warmth.  She has had no recent injuries.  However as a child she did have a femoral fracture treated with traction.  She denies locking or giving way but she does feel that the knee is not as strong as the other leg.  She has been using leftover diclofenac which helps significantly.  Tylenol though not very helpful.   Assessment  1. Chronic pain of right knee   2. History of femur fracture      Plan  Right knee pain and history of fracture: Could be related to posttraumatic osteoarthritis.  Check x-rays.  Treat with diclofenac for now.  Will also check venous Dopplers to rule out Baker's cyst given posterior knee pain and fullness and pressure sensations.  Can consider steroid injection depending upon results of the studies.  If not improving, will refer to sports medicine for further evaluation and treatment.  Patient agrees and understands the care plan I reviewed ov from 02/2022 pertaining to same.  Ordered 2 unique tests.  Prescribed RX medication  Follow up: Complete physical due. Visit date not found  Orders Placed This Encounter  Procedures   DG Knee Complete 4 Views Right   DG FEMUR, MIN 2 VIEWS RIGHT   VAS [place] LOWER EXTREMITY VENOUS (DVT)   Meds ordered this encounter  Medications   diclofenac (VOLTAREN) 75 MG EC  tablet    Sig: Take 1 tablet (75 mg total) by mouth 2 (two) times daily as needed for moderate pain.    Dispense:  60 tablet    Refill:  2      I reviewed the patients updated PMH, FH, and SocHx.    Patient Active Problem List   Diagnosis Date Noted   Family history of breast cancer 01/22/2021    Priority: High   Leukocytosis 01/22/2021    Priority: Medium    Nephrolithiasis 01/22/2021    Priority: Medium    Obesity (BMI 30-39.9) 01/22/2021    Priority: Medium    Chronic allergic rhinitis 05/17/2014    Priority: Low   Current Meds  Medication Sig   diclofenac (VOLTAREN) 75 MG EC tablet Take 1 tablet (75 mg total) by mouth 2 (two) times daily as needed for moderate pain.   MELATONIN PO Take by mouth.    Allergies: Patient is allergic to robaxin [methocarbamol]. Family History: Patient family history includes Breast cancer in her paternal aunt, paternal aunt, and paternal aunt; COPD in her mother; Cancer in her paternal grandmother; Epilepsy in her mother; Heart attack in her mother; Heart disease in her mother; Multiple sclerosis in her maternal grandmother; Obesity in her mother; Stroke in her mother. Social History:  Patient  reports that she has never smoked. She has  never used smokeless tobacco. She reports current alcohol use. She reports that she does not use drugs.  Review of Systems: Constitutional: Negative for fever malaise or anorexia Cardiovascular: negative for chest pain Respiratory: negative for SOB or persistent cough Gastrointestinal: negative for abdominal pain  Objective  Vitals: BP 111/76   Pulse 82   Temp 98.1 F (36.7 C)   Ht '5\' 7"'$  (1.702 m)   Wt 229 lb (103.9 kg)   LMP 08/23/2016   SpO2 99%   BMI 35.87 kg/m  General: no acute distress , A&Ox3 Antalgic gait Right knee is normal.  Mild crepitus, no joint line tenderness, erythema, warmth or effusion present.  Flexion is limited at 125 degrees due to pain.  Negative Lachman is negative  McMurray's.  Unable to do a squat on the right side. Normal strength in the right lower extremity throughout   Commons side effects, risks, benefits, and alternatives for medications and treatment plan prescribed today were discussed, and the patient expressed understanding of the given instructions. Patient is instructed to call or message via MyChart if he/she has any questions or concerns regarding our treatment plan. No barriers to understanding were identified. We discussed Red Flag symptoms and signs in detail. Patient expressed understanding regarding what to do in case of urgent or emergency type symptoms.  Medication list was reconciled, printed and provided to the patient in AVS. Patient instructions and summary information was reviewed with the patient as documented in the AVS. This note was prepared with assistance of Dragon voice recognition software. Occasional wrong-word or sound-a-like substitutions may have occurred due to the inherent limitations of voice recognition software  This visit occurred during the SARS-CoV-2 public health emergency.  Safety protocols were in place, including screening questions prior to the visit, additional usage of staff PPE, and extensive cleaning of exam room while observing appropriate contact time as indicated for disinfecting solutions.

## 2022-12-05 NOTE — Patient Instructions (Signed)
Please return for your annual complete physical; please come fasting.   Please go to our [facility] office to get your xrays done. You can walk in M-F between 8:30am- noon or 1pm - 5pm. Tell them you are there for xrays ordered by me. They will send me the results, then I will let you know the results with instructions.   Address: [address].  The Xray department is located in the basement.   We will call you to get you set up for your ultrasound of your right leg as well.   If you have any questions or concerns, please don't hesitate to send me a message via MyChart or call the office at [phone]. Thank you for visiting with [place] today! It's our pleasure caring for you.

## 2022-12-06 ENCOUNTER — Ambulatory Visit (HOSPITAL_COMMUNITY)
Admission: RE | Admit: 2022-12-06 | Discharge: 2022-12-06 | Disposition: A | Discharge status: Home / Self Care | Payer: Commercial Managed Care - HMO | Source: Ambulatory Visit | Attending: Family Medicine | Admitting: Family Medicine

## 2022-12-06 DIAGNOSIS — G8929 Other chronic pain: Secondary | ICD-10-CM

## 2022-12-06 DIAGNOSIS — M25561 Pain in right knee: Secondary | ICD-10-CM | POA: Diagnosis not present

## 2022-12-06 DIAGNOSIS — Z8781 Personal history of (healed) traumatic fracture: Secondary | ICD-10-CM | POA: Diagnosis not present

## 2022-12-09 ENCOUNTER — Encounter (HOSPITAL_COMMUNITY): Payer: 59

## 2022-12-12 ENCOUNTER — Encounter: Payer: Self-pay | Admitting: *Deleted

## 2022-12-19 ENCOUNTER — Ambulatory Visit (INDEPENDENT_AMBULATORY_CARE_PROVIDER_SITE_OTHER): Payer: 59 | Admitting: Family Medicine

## 2022-12-19 ENCOUNTER — Encounter: Payer: Self-pay | Admitting: Family Medicine

## 2022-12-19 VITALS — BP 113/78 | HR 89 | Temp 97.4°F | Ht 67.0 in | Wt 228.0 lb

## 2022-12-19 DIAGNOSIS — Z0000 Encounter for general adult medical examination without abnormal findings: Secondary | ICD-10-CM

## 2022-12-19 DIAGNOSIS — G8929 Other chronic pain: Secondary | ICD-10-CM

## 2022-12-19 DIAGNOSIS — Z23 Encounter for immunization: Secondary | ICD-10-CM

## 2022-12-19 DIAGNOSIS — M25561 Pain in right knee: Secondary | ICD-10-CM

## 2022-12-19 LAB — COMPREHENSIVE METABOLIC PANEL
ALT: 20 U/L (ref 0–35)
AST: 22 U/L (ref 0–37)
Albumin: 4.6 g/dL (ref 3.5–5.2)
Alkaline Phosphatase: 93 U/L (ref 39–117)
BUN: 12 mg/dL (ref 6–23)
CO2: 29 mEq/L (ref 19–32)
Calcium: 9.8 mg/dL (ref 8.4–10.5)
Chloride: 102 mEq/L (ref 96–112)
Creatinine, Ser: 0.84 mg/dL (ref 0.40–1.20)
GFR: 80.97 mL/min (ref >60.00)
Glucose, Bld: 99 mg/dL (ref 70–99)
Potassium: 4.3 mEq/L (ref 3.5–5.1)
Sodium: 139 mEq/L (ref 135–145)
Total Bilirubin: 0.5 mg/dL (ref 0.2–1.2)
Total Protein: 7.3 g/dL (ref 6.0–8.3)

## 2022-12-19 LAB — LIPID PANEL
Cholesterol: 246 mg/dL — ABNORMAL HIGH (ref 0–200)
HDL: 36.4 mg/dL — ABNORMAL LOW (ref >39.00)
LDL Cholesterol: 174 mg/dL — ABNORMAL HIGH (ref 0–99)
NonHDL: 209.24
Total CHOL/HDL Ratio: 7
Triglycerides: 176 mg/dL — ABNORMAL HIGH (ref 0.0–149.0)
VLDL: 35.2 mg/dL (ref 0.0–40.0)

## 2022-12-19 LAB — CBC WITH DIFFERENTIAL/PLATELET
Basophils Absolute: 0 10*3/uL (ref 0.0–0.1)
Basophils Relative: 0.5 % (ref 0.0–3.0)
Eosinophils Absolute: 0.1 10*3/uL (ref 0.0–0.7)
Eosinophils Relative: 1.5 % (ref 0.0–5.0)
HCT: 39.3 % (ref 36.0–46.0)
Hemoglobin: 13.1 g/dL (ref 12.0–15.0)
Lymphocytes Relative: 25.7 % (ref 12.0–46.0)
Lymphs Abs: 2.4 10*3/uL (ref 0.7–4.0)
MCHC: 33.4 g/dL (ref 30.0–36.0)
MCV: 86 fl (ref 78.0–100.0)
Monocytes Absolute: 0.6 10*3/uL (ref 0.1–1.0)
Monocytes Relative: 6.1 % (ref 3.0–12.0)
Neutro Abs: 6.3 10*3/uL (ref 1.4–7.7)
Neutrophils Relative %: 66.2 % (ref 43.0–77.0)
Platelets: 427 10*3/uL — ABNORMAL HIGH (ref 150.0–400.0)
RBC: 4.57 Mil/uL (ref 3.87–5.11)
RDW: 12.9 % (ref 11.5–15.5)
WBC: 9.5 10*3/uL (ref 4.0–10.5)

## 2022-12-19 NOTE — Patient Instructions (Signed)
Please return in 12 months for your annual complete physical; please come fasting.  Make a nurse visit in 6 months to receive your 2nd Shingrix vaccine.  Today you were given your flu and 1st shingrix vaccine.   I will release your lab results to you on your MyChart account with further instructions. You may see the results before I do, but when I review them I will send you a message with my report or have my assistant call you if things need to be discussed. Please reply to my message with any questions. Thank you!   Please go to our [facility] office to get your xrays done. You can walk in M-F between 8:30am- noon or 1pm - 5pm. Tell them you are there for xrays ordered by me. They will send me the results, then I will let you know the results with instructions.   Address: [address].  The Xray department is located in the basement.    If you have any questions or concerns, please don't hesitate to send me a message via MyChart or call the office at [phone]. Thank you for visiting with [place] today! It's our pleasure caring for you.

## 2022-12-19 NOTE — Progress Notes (Signed)
Subjective  Chief Complaint  Patient presents with   Annual Exam    HPI: Christina Mora is a 51 y.o. female who presents to [facility] today for a Female Wellness Visit. She also has the concerns and/or needs as listed above in the chief complaint. These will be addressed in addition to the Health Maintenance Visit.   Wellness Visit: annual visit with health maintenance review and exam without Pap  HM: mammo current. Eligible for flu and shingrix today. 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 Working and doing very well. Happy and healthy. Overweight.  Chronic disease f/u and/or acute problem visit: (deemed necessary to be done in addition to the wellness visit): Knee pain: much better with voltaren. Didn't get xrays yet. Now mobile again and less pain/swelling/tension in knee.   Assessment  1. Annual physical exam   2. Chronic pain of right knee   3. Need for shingles vaccine   4. Need for immunization against influenza      Plan  Female Wellness Visit: Age appropriate Health Maintenance and Prevention measures were discussed with patient. Included topics are cancer screening recommendations, ways to keep healthy (see AVS) including dietary and exercise recommendations, regular eye and dental care, use of seat belts, and avoidance of moderate alcohol use and tobacco use.  BMI: discussed patient's BMI and encouraged positive lifestyle modifications to help get to or maintain a target BMI. HM needs and immunizations were addressed and ordered. See below for orders. See HM and immunization section for updates. Flu and 1st of 2 shingrix today.  Routine labs and screening tests ordered including cmp, cbc and lipids where appropriate. Discussed recommendations regarding Vit D and calcium supplementation (see AVS)  Chronic disease management visit and/or acute problem visit: Knee pain: suspect OA responsive to nsaids. Get xrays. Consider hyaluronic acid injections  and/or steroids in future if needed. Education given.   Follow up: 12 mo for cpe, 6 mo for 2nd shingrix  Orders Placed This Encounter  Procedures   Zoster Recombinant (Shingrix )   Flu Vaccine QUAD 67moIM (Fluarix, Fluzone & Alfiuria Quad PF)   CBC with Differential/Platelet   Comprehensive metabolic panel   Lipid panel   No orders of the defined types were placed in this encounter.     Body mass index is 35.71 kg/m. Wt Readings from Last 3 Encounters:  12/19/22 228 lb (103.4 kg)  12/05/22 229 lb (103.9 kg)  08/12/22 234 lb 3.2 oz (106.2 kg)     Patient Active Problem List   Diagnosis Date Noted   Family history of breast cancer 01/22/2021    Priority: High   Leukocytosis 01/22/2021    Priority: Medium     Persistent since 2015. ? If ever evaluated    Nephrolithiasis 01/22/2021    Priority: Medium    Obesity (BMI 30-39.9) 01/22/2021    Priority: Medium    Chronic allergic rhinitis 05/17/2014    Priority: [place]  Topic Date Due   COVID-19 Vaccine (4 - 2023-24 season) 12/21/2022 (Originally 08/30/2022)   Zoster Vaccines- Shingrix (2 of 2) 02/13/2023   MAMMOGRAM  05/17/2023   Fecal DNA (Cologuard)  07/09/2023   DTaP/Tdap/Td (3 - Td or Tdap) 01/22/2031   INFLUENZA VACCINE  Completed   Hepatitis C Screening  Completed   HIV Screening  Completed   HPV VACCINES  Aged Out   PAP SMEAR-Modifier  Discontinued   Immunization History  Administered Date(s) Administered  Influenza Inj Mdck Quad Pf 09/19/2018   Influenza,inj,Quad PF,6+ Mos 01/26/2016, 10/29/2017, 10/10/2019, 12/19/2022   Influenza-Unspecified 10/02/2018, 11/28/2020   PFIZER(Purple Top)SARS-COV-2 Vaccination 03/04/2020, 04/07/2020, 10/29/2020   Tdap 12/30/2010, 01/22/2021   Zoster Recombinat (Shingrix) 12/19/2022   We updated and reviewed the patient's past history in detail and it is documented below. Allergies: Patient is allergic to robaxin [methocarbamol]. Past Medical  History Patient  has a past medical history of Adjustment reaction with anxiety (01/22/2021), Allergy, Elevated WBC count (2017), Fibroids, Nephrolithiasis (01/22/2021), Pre-diabetes, and SVD (spontaneous vaginal delivery). Past Surgical History Patient  has a past surgical history that includes Fracture surgery; Eye surgery (Bilateral); Dilation and curettage of uterus; Hysterectomy abdominal with salpingectomy (Bilateral, 09/24/2016); and Abdominal hysterectomy (09/24/2016). Family History: Patient family history includes Breast cancer in her paternal aunt, paternal aunt, and paternal aunt; COPD in her mother; Cancer in her paternal grandmother; Epilepsy in her mother; Heart attack in her mother; Heart disease in her mother; Multiple sclerosis in her maternal grandmother; Obesity in her mother; Stroke in her mother. Social History:  Patient  reports that she has never smoked. She has never used smokeless tobacco. She reports current alcohol use. She reports that she does not use drugs.  Review of Systems: Constitutional: negative for fever or malaise Ophthalmic: negative for photophobia, double vision or loss of vision Cardiovascular: negative for chest pain, dyspnea on exertion, or new LE swelling Respiratory: negative for SOB or persistent cough Gastrointestinal: negative for abdominal pain, change in bowel habits or melena Genitourinary: negative for dysuria or gross hematuria, no abnormal uterine bleeding or disharge Musculoskeletal: negative for new gait disturbance or muscular weakness Integumentary: negative for new or persistent rashes, no breast lumps Neurological: negative for TIA or stroke symptoms Psychiatric: negative for SI or delusions Allergic/Immunologic: negative for hives  Patient Care Team    Relationship Specialty Notifications Start End  Leamon Arnt, MD PCP - General Family Medicine  01/22/21   Suella Broad, MD Consulting Physician Physical Medicine and  Rehabilitation  01/22/21     Objective  Vitals: BP 113/78 (BP Location: Right Arm, Patient Position: Sitting)   Pulse 89   Temp (!) 97.4 F (36.3 C) (Temporal)   Ht '5\' 7"'$  (1.702 m)   Wt 228 lb (103.4 kg)   LMP 08/23/2016   SpO2 98%   BMI 35.71 kg/m  General:  Well developed, well nourished, no acute distress  Psych:  Alert and orientedx3,normal mood and affect HEENT:  Normocephalic, atraumatic, non-icteric sclera,  supple neck without adenopathy, mass or thyromegaly Cardiovascular:  Normal S1, S2, RRR without gallop, rub or murmur Respiratory:  Good breath sounds bilaterally, CTAB with normal respiratory effort Gastrointestinal: normal bowel sounds, soft, non-tender, no noted masses. No HSM MSK: no deformities, contusions. Joints are without erythema or swelling.  Skin:  Warm, no rashes or suspicious lesions noted Neuro: non focal Commons side effects, risks, benefits, and alternatives for medications and treatment plan prescribed today were discussed, and the patient expressed understanding of the given instructions. Patient is instructed to call or message via MyChart if he/she has any questions or concerns regarding our treatment plan. No barriers to understanding were identified. We discussed Red Flag symptoms and signs in detail. Patient expressed understanding regarding what to do in case of urgent or emergency type symptoms.  Medication list was reconciled, printed and provided to the patient in AVS. Patient instructions and summary information was reviewed with the patient as documented in the AVS. This note was prepared with  assistance of Systems analyst. Occasional wrong-word or sound-a-like substitutions may have occurred due to the inherent limitations of voice recognition software

## 2023-01-02 ENCOUNTER — Ambulatory Visit (INDEPENDENT_AMBULATORY_CARE_PROVIDER_SITE_OTHER)
Admission: RE | Admit: 2023-01-02 | Discharge: 2023-01-02 | Disposition: A | Discharge status: Home / Self Care | Payer: 59 | Source: Ambulatory Visit | Attending: Family Medicine | Admitting: Family Medicine

## 2023-01-02 DIAGNOSIS — Z8781 Personal history of (healed) traumatic fracture: Secondary | ICD-10-CM

## 2023-01-02 DIAGNOSIS — M25561 Pain in right knee: Secondary | ICD-10-CM

## 2023-01-02 DIAGNOSIS — G8929 Other chronic pain: Secondary | ICD-10-CM

## 2023-04-07 ENCOUNTER — Other Ambulatory Visit (HOSPITAL_COMMUNITY): Payer: Self-pay | Admitting: Orthopaedic Surgery

## 2023-04-07 ENCOUNTER — Telehealth: Payer: Self-pay

## 2023-04-07 ENCOUNTER — Telehealth: Payer: Self-pay | Admitting: Family Medicine

## 2023-04-07 ENCOUNTER — Ambulatory Visit (HOSPITAL_COMMUNITY)
Admission: RE | Admit: 2023-04-07 | Discharge: 2023-04-07 | Disposition: A | Discharge status: Home / Self Care | Payer: Commercial Managed Care - HMO | Source: Ambulatory Visit | Attending: Internal Medicine | Admitting: Internal Medicine

## 2023-04-07 DIAGNOSIS — M79661 Pain in right lower leg: Secondary | ICD-10-CM | POA: Diagnosis present

## 2023-04-07 DIAGNOSIS — M7989 Other specified soft tissue disorders: Secondary | ICD-10-CM | POA: Diagnosis not present

## 2023-04-07 NOTE — Telephone Encounter (Addendum)
Final disposition: Go to ED Now. Patient is currently at [facility],cardiology, office for [place] of leg.   Patient Name: Christina Mora Gender: Female DOB: 1971-10-22 Age: 52 Y 9 M 25 D Return Phone Number: [phone] (Primary), [phone] (Secondary) Address: City/ State/ Zip: [place]  [address] Client [facility] - Administrator, sports at [facility] Provider Asencion Partridge- MD Contact Type Call Who Is Calling Patient / Member / Family / Caregiver Call Type Triage / Clinical Relationship To Patient Self Return Phone Number [phone] (Primary) Chief Complaint Leg Pain Reason for Call Symptomatic / Request for Health Information Initial Comment Caller says she may have a blood clot in her right calf. Extreme pain in calf. Surgery was on 03/13/23 Translation No Nurse Assessment Nurse: Isabella Bowens, RN, [place] Date/Time (Eastern Time): 04/07/2023 12:31:12 PM Confirm and document reason for call. If symptomatic, describe symptoms. ---Caller states she had knee surgery on 03/13/23. Yesterday she developed right calf and hip pain that is inhibiting her ability to walk. The hip pain resolved but she continues to have sharp, achy pain in her calf that worsens with movement and weight. Does the patient have any new or worsening symptoms? ---Yes Will a triage be completed? ---Yes Related visit to physician within the last 2 weeks? ---Yes Does the PT have any chronic conditions? (i.e. diabetes, asthma, this includes High risk factors for pregnancy, etc.) ---Yes List chronic conditions. ---cerebral palsy Is the patient pregnant or possibly pregnant? (Ask all females between the ages of 73-55) ---No Is this a behavioral health or substance abuse call? ---No Guidelines Guideline Title Affirmed Question Affirmed Notes Nurse Date/Time (Eastern Time) Leg Pain Unable to walk Belac, RN, Katlin 04/07/2023 12:33:28 PM PLEASE  NOTE: All timestamps contained within this report are represented as [place] Standard Time. CONFIDENTIALTY NOTICE: This fax transmission is intended only for the addressee. It contains information that is legally privileged, confidential or otherwise protected from use or disclosure. If you are not the intended recipient, you are strictly prohibited from reviewing, disclosing, copying using or disseminating any of this information or taking any action in reliance on or regarding this information. If you have received this fax in error, please notify us immediately by telephone so that we can arrange for its return to [place]. Phone: [phone], Toll-Free: [phone], Fax: [phone] Page: 2 of 2 Call Id: [id number] Disp. Time Lamount Cohen Time) Disposition Final User 04/07/2023 12:38:53 PM Go to ED Now Yes Isabella Bowens, RN, Katlin Final Disposition 04/07/2023 12:38:53 PM Go to ED Now Yes Belac, RN, [place]lin Caller Disagree/Comply Comply Caller Understands Yes PreDisposition Did not know what to do Care Advice Given Per Guideline GO TO ED NOW: * You need to be seen in the Emergency Department. * Go to the ED at ___________ Hospital. * Leave now. Drive carefully. CARE ADVICE given per Leg Pain (Adult) guideline. Referrals [facility] - ED

## 2023-04-07 NOTE — Telephone Encounter (Signed)
Orthopedic wants results of venus doppler.  Advised they are not released but will fax to him once read.  Fax to Sam at [phone]

## 2023-04-07 NOTE — Telephone Encounter (Signed)
Sam with [facility] is requesting a callback regarding patient's LE Venous results that were ordered by their office. He is also requesting it be faxed to [phone]. Please advise.

## 2023-04-07 NOTE — Telephone Encounter (Signed)
Patient states: -Last night experienced extreme pain in right calf and right hip  - Hip pain resolved but feels sharp pain when placing pressure on right leg  - Concerned could be postoperative blood clot; Had a knee surgery on 3/14  Patient has been transferred to triage.

## 2023-05-22 LAB — HM MAMMOGRAPHY

## 2023-05-28 ENCOUNTER — Encounter: Payer: Self-pay | Admitting: Family Medicine

## 2023-06-18 ENCOUNTER — Telehealth: Payer: Self-pay

## 2023-06-18 NOTE — Telephone Encounter (Signed)
LVM for pt to contact [facility] because they have not been able to reach her in regards to some F/U care based on recommendations by the radiologist

## 2023-09-16 ENCOUNTER — Encounter: Payer: Self-pay | Admitting: Family Medicine

## 2023-10-06 ENCOUNTER — Other Ambulatory Visit: Payer: Self-pay | Admitting: Radiology

## 2023-10-07 LAB — SURGICAL PATHOLOGY

## 2023-10-08 ENCOUNTER — Encounter: Payer: Self-pay | Admitting: Family Medicine

## 2023-10-15 ENCOUNTER — Encounter: Payer: Self-pay | Admitting: Family Medicine

## 2023-12-04 ENCOUNTER — Other Ambulatory Visit: Payer: Self-pay | Admitting: Family Medicine

## 2023-12-08 ENCOUNTER — Encounter: Payer: Self-pay | Admitting: Family Medicine

## 2023-12-08 ENCOUNTER — Ambulatory Visit: Payer: Commercial Managed Care - HMO | Admitting: Family Medicine

## 2023-12-08 VITALS — BP 116/64 | HR 100 | Temp 97.7°F | Ht 67.0 in | Wt 237.6 lb

## 2023-12-08 DIAGNOSIS — M545 Low back pain, unspecified: Secondary | ICD-10-CM

## 2023-12-08 DIAGNOSIS — N200 Calculus of kidney: Secondary | ICD-10-CM

## 2023-12-08 DIAGNOSIS — Z1211 Encounter for screening for malignant neoplasm of colon: Secondary | ICD-10-CM | POA: Diagnosis not present

## 2023-12-08 DIAGNOSIS — Z1212 Encounter for screening for malignant neoplasm of rectum: Secondary | ICD-10-CM | POA: Diagnosis not present

## 2023-12-08 LAB — POCT URINALYSIS DIPSTICK
Blood, UA: 3 — AB
Glucose, UA: NEGATIVE
Ketones, UA: 5 — ABNORMAL HIGH
Nitrite, UA: POSITIVE — AB
Protein, UA: NEGATIVE
Spec Grav, UA: 1.025 (ref 1.010–1.025)
Urobilinogen, UA: 2 U/dL — AB
pH, UA: 5 (ref 5.0–8.0)

## 2023-12-08 MED ORDER — CYCLOBENZAPRINE HCL 10 MG PO TABS: 10.0000 mg | ORAL_TABLET | Freq: Every evening | ORAL | 0 refills | Status: DC | PRN

## 2023-12-08 MED ORDER — TRAMADOL HCL 50 MG PO TABS: 50.0000 mg | ORAL_TABLET | Freq: Three times a day (TID) | ORAL | 0 refills | Status: DC | PRN

## 2023-12-08 MED ORDER — DICLOFENAC SODIUM 75 MG PO TBEC: 75.0000 mg | DELAYED_RELEASE_TABLET | Freq: Two times a day (BID) | ORAL | 3 refills | Status: DC | PRN

## 2023-12-08 MED ORDER — KETOROLAC TROMETHAMINE 60 MG/2ML IM SOLN
60.0000 mg | Freq: Once | INTRAMUSCULAR | Status: AC
  Administered 2023-12-08: 60 mg via INTRAMUSCULAR

## 2023-12-08 NOTE — Progress Notes (Signed)
Subjective  CC:  Chief Complaint  Patient presents with   Back Pain   Pelvic Pain    Pt stated that she has been having some pain since last Wednesday and the lat time she experience this is when she was trying to pass a kidney stone Hurts more when she lays down or cough.     HPI: Christina Mora is a 52 y.o. female who presents to the office today to address the problems listed above in the chief complaint. Discussed the use of AI scribe software for clinical note transcription with the patient, who gave verbal consent to proceed.  History of Present Illness   The patient, with a history of arthritis and previous kidney stones, presents with a constant pain in the left lower back that started approximately a week ago. The pain is always present and varies in intensity depending on the patient's position. The pain is described as starting in the lower back and can radiate upwards. The patient also reports pain on the side when the pain intensifies. The pain is exacerbated by coughing and lying down, and is somewhat relieved when standing. The patient denies any hematuria or fever.  The patient has been self-medicating with a previously prescribed bladder irritation medication, which has not significantly alleviated the pain. Over-the-counter Advil has also been used, but has not provided any relief. The patient reports a recent history of a severe cold with intense coughing fits, during which she thought she might have pulled a muscle or cracked a rib. The patient denies any shortness of breath or tightness in the chest.  The patient also reports chronic arthritis in the knees and feet, which is usually managed with diclofenac. However, the patient has recently run out of this medication and reports increased stiffness and achiness. The patient has been sleeping poorly due to the pain, reporting only two hours of sleep the previous night.      Assessment  1. Acute left-sided low back pain  without sciatica   2. Nephrolithiasis   3. Screening for colorectal cancer      Plan  Assessment and Plan    Low Back Pain Chronic low back pain likely secondary to muscle strain from severe coughing fits. Pain is constant, varies with position, and exacerbated by certain movements. No signs of nerve impingement or radiculopathy. Differential diagnosis includes kidney stone, but atypical presentation for nephrolithiasis. Discussed that kidney stones typically cause spasms of pain and are not positional. Explained that low back pain can be severe and debilitating, often requiring pain management and muscle relaxants. Discussed the use of Toradol injection for immediate pain relief, muscle relaxers, and tramadol for stronger pain relief if needed. Emphasized the importance of hydration and monitoring for blood in urine. If no improvement, a CT scan may be necessary to rule out kidney stones. - Administer Toradol injection for pain relief - Prescribe muscle relaxer - Prescribe tramadol for stronger pain relief if needed - Continue Advil as needed - Obtain urine sample to check for blood: however, on AZO so not helpful and didn't send out for Micro.  - Hydrate adequately - Consider CT scan if no improvement  General Health Maintenance Due for a physical exam. Discussed the importance of regular medication for arthritis management. Due for the second dose of the Shingrix vaccine but will defer due to current illness. Discussed daily use of diclofenac for arthritis management and the need for regular follow-up. - Schedule physical exam - Order Cologuard test -  Hold off on Shingrix vaccine until feeling better - Discuss daily use of diclofenac for arthritis management  Follow-up - Follow up if no improvement in symptoms - Schedule physical exam and discuss daily achiness and arthritis management.       Orders Placed This Encounter  Procedures   Cologuard   POCT urinalysis dipstick   No  orders of the defined types were placed in this encounter.     I reviewed the patients updated PMH, FH, and SocHx.    Patient Active Problem List   Diagnosis Date Noted   Family history of breast cancer 01/22/2021    Priority: High   Leukocytosis 01/22/2021    Priority: Medium    Nephrolithiasis 01/22/2021    Priority: Medium    Obesity (BMI 30-39.9) 01/22/2021    Priority: Medium    Chronic allergic rhinitis 05/17/2014    Priority: Low   Current Meds  Medication Sig   diclofenac (VOLTAREN) 75 MG EC tablet Take 1 tablet (75 mg total) by mouth 2 (two) times daily as needed for moderate pain.   MELATONIN PO Take by mouth.    Allergies: Patient is allergic to robaxin [methocarbamol]. Family History: Patient family history includes Breast cancer in her paternal aunt, paternal aunt, and paternal aunt; COPD in her mother; Cancer in her paternal grandmother; Epilepsy in her mother; Heart attack in her mother; Heart disease in her mother; Multiple sclerosis in her maternal grandmother; Obesity in her mother; Stroke in her mother. Social History:  Patient  reports that she has never smoked. She has never used smokeless tobacco. She reports current alcohol use. She reports that she does not use drugs.  Review of Systems: Constitutional: Negative for fever malaise or anorexia Cardiovascular: negative for chest pain Respiratory: negative for SOB or persistent cough Gastrointestinal: negative for abdominal pain  Objective  Vitals: BP 116/64   Pulse 100   Temp 97.7 F (36.5 C)   Ht 5\' 7"  (1.702 m)   Wt 237 lb 9.6 oz (107.8 kg)   LMP 08/23/2016   SpO2 97%   BMI 37.21 kg/m  General: nontoxic but appears uncomfortable in certain positions, A&Ox3 HEENT: PEERL, conjunctiva normal, neck is supple Cardiovascular:  RRR without murmur or gallop.  Respiratory:  Good breath sounds bilaterally, CTAB with normal respiratory effort, no cvat Gastrointestinal: soft, flat abdomen, normal active  bowel sounds, no palpable masses, no hepatosplenomegaly, no appreciated hernias Non tender Back: left low back w/ pain w/ right lateral flexion, forward flextion and extension. Neg slr bilaterally, antalgic gait. Non tender. No spinal ttp Skin:  Warm, no rashes  Commons side effects, risks, benefits, and alternatives for medications and treatment plan prescribed today were discussed, and the patient expressed understanding of the given instructions. Patient is instructed to call or message via MyChart if he/she has any questions or concerns regarding our treatment plan. No barriers to understanding were identified. We discussed Red Flag symptoms and signs in detail. Patient expressed understanding regarding what to do in case of urgent or emergency type symptoms.  Medication list was reconciled, printed and provided to the patient in AVS. Patient instructions and summary information was reviewed with the patient as documented in the AVS. This note was prepared with assistance of Dragon voice recognition software. Occasional wrong-word or sound-a-like substitutions may have occurred due to the inherent limitations of voice recognition software

## 2023-12-08 NOTE — Patient Instructions (Addendum)
Please return for your annual complete physical; please come fasting.   If you have any questions or concerns, please don't hesitate to send me a message via MyChart or call the office at [phone]. Thank you for visiting with [place] today! It's our pleasure caring for you.   VISIT SUMMARY:  You came in today with constant pain in your left lower back that started about a week ago. The pain varies in intensity and is somewhat relieved when standing. You also mentioned increased stiffness and achiness in your knees and feet due to running out of your arthritis medication. We discussed your symptoms and possible causes, and I provided a plan to help manage your pain and overall health.  YOUR PLAN:  -LOW BACK PAIN: Your low back pain is likely related to musculoskeletal pain, although a kidney stone is still possible.  This type of pain can be severe and varies with your position. We administered a Toradol injection for immediate pain relief and prescribed a muscle relaxer and tramadol for stronger pain relief if needed. Continue taking Advil as needed, stay hydrated, and monitor for any blood in your urine. If your pain does not improve, we may need to do a CT scan to rule out kidney stones.  -GENERAL HEALTH MAINTENANCE: You are due for a physical exam and the second dose of the Shingrix vaccine, but we will defer the vaccine until you are feeling better. We discussed the importance of taking your arthritis medication daily to manage your symptoms. We will also order a Cologuard test for you.  INSTRUCTIONS:  Please follow up if your symptoms do not improve. Schedule a physical exam to discuss your daily achiness and arthritis management.

## 2023-12-09 ENCOUNTER — Telehealth: Payer: Self-pay | Admitting: Family Medicine

## 2023-12-09 ENCOUNTER — Encounter: Payer: Self-pay | Admitting: Family Medicine

## 2023-12-09 NOTE — Telephone Encounter (Signed)
Spoke with pt regarding lab results/recommendations. Pt understood.

## 2023-12-09 NOTE — Telephone Encounter (Signed)
Pt called back and would like a call back asap concerning her labs and change of meds. Please advise.

## 2023-12-09 NOTE — Telephone Encounter (Signed)
Patient requests to be called to discuss Lab results received on MyChart 

## 2023-12-09 NOTE — Progress Notes (Signed)
See mychart note Dear Ms. Virgo, As you now know, this test was inaccurate due to the use of the AZO type medication.  Please let me know if your symptoms are not improving.  Sincerely, Dr. Mardelle Matte

## 2023-12-15 ENCOUNTER — Ambulatory Visit: Payer: Commercial Managed Care - HMO | Admitting: Family Medicine

## 2023-12-18 ENCOUNTER — Encounter: Payer: Self-pay | Admitting: Family Medicine

## 2023-12-18 ENCOUNTER — Telehealth: Payer: Self-pay

## 2023-12-18 ENCOUNTER — Ambulatory Visit: Payer: Commercial Managed Care - HMO | Admitting: Family Medicine

## 2023-12-18 VITALS — BP 114/64 | HR 80 | Temp 97.8°F | Ht 67.0 in | Wt 236.4 lb

## 2023-12-18 DIAGNOSIS — R109 Unspecified abdominal pain: Secondary | ICD-10-CM | POA: Diagnosis not present

## 2023-12-18 DIAGNOSIS — R1032 Left lower quadrant pain: Secondary | ICD-10-CM | POA: Diagnosis not present

## 2023-12-18 DIAGNOSIS — N200 Calculus of kidney: Secondary | ICD-10-CM | POA: Diagnosis not present

## 2023-12-18 LAB — URINALYSIS, ROUTINE W REFLEX MICROSCOPIC
Bilirubin Urine: NEGATIVE
Ketones, ur: NEGATIVE
Leukocytes,Ua: NEGATIVE
Nitrite: NEGATIVE
Specific Gravity, Urine: 1.015 (ref 1.000–1.030)
Total Protein, Urine: NEGATIVE
Urine Glucose: NEGATIVE
Urobilinogen, UA: 0.2 (ref 0.0–1.0)
pH: 6 (ref 5.0–8.0)

## 2023-12-18 LAB — CBC WITH DIFFERENTIAL/PLATELET
Basophils Absolute: 0.1 10*3/uL (ref 0.0–0.1)
Basophils Relative: 0.7 % (ref 0.0–3.0)
Eosinophils Absolute: 0.2 10*3/uL (ref 0.0–0.7)
Eosinophils Relative: 1.6 % (ref 0.0–5.0)
HCT: 38.5 % (ref 36.0–46.0)
Hemoglobin: 12.6 g/dL (ref 12.0–15.0)
Lymphocytes Relative: 27.8 % (ref 12.0–46.0)
Lymphs Abs: 2.7 10*3/uL (ref 0.7–4.0)
MCHC: 32.7 g/dL (ref 30.0–36.0)
MCV: 88.3 fL (ref 78.0–100.0)
Monocytes Absolute: 0.4 10*3/uL (ref 0.1–1.0)
Monocytes Relative: 4.4 % (ref 3.0–12.0)
Neutro Abs: 6.3 10*3/uL (ref 1.4–7.7)
Neutrophils Relative %: 65.5 % (ref 43.0–77.0)
Platelets: 443 10*3/uL — ABNORMAL HIGH (ref 150.0–400.0)
RBC: 4.36 Mil/uL (ref 3.87–5.11)
RDW: 13.3 % (ref 11.5–15.5)
WBC: 9.6 10*3/uL (ref 4.0–10.5)

## 2023-12-18 LAB — BASIC METABOLIC PANEL
BUN: 15 mg/dL (ref 6–23)
CO2: 22 meq/L (ref 19–32)
Calcium: 9 mg/dL (ref 8.4–10.5)
Chloride: 104 meq/L (ref 96–112)
Creatinine, Ser: 0.86 mg/dL (ref 0.40–1.20)
GFR: 78.17 mL/min (ref >60.00)
Glucose, Bld: 116 mg/dL — ABNORMAL HIGH (ref 70–99)
Potassium: 4.1 meq/L (ref 3.5–5.1)
Sodium: 136 meq/L (ref 135–145)

## 2023-12-18 MED ORDER — TAMSULOSIN HCL 0.4 MG PO CAPS: 0.4000 mg | ORAL_CAPSULE | Freq: Every day | ORAL | 0 refills | Status: DC

## 2023-12-18 NOTE — Progress Notes (Signed)
See mychart note Can you check on the status of the order for the renal CT, stone protocol. Thanks!

## 2023-12-18 NOTE — Progress Notes (Signed)
Subjective  CC:  Chief Complaint  Patient presents with   Back Pain    Pt stated that she is still have some lower side and back pain. Pt stated that she passed a kidney stone but not sure of the day. When she lays on her Lt side it hurts    HPI: Christina Mora is a 52 y.o. female who presents to the office today to address the problems listed above in the chief complaint. Discussed the use of AI scribe software for clinical note transcription with the patient, who gave verbal consent to proceed.  History of Present Illness   The patient, with a history of small kidney stones, presents with persistent left sided pain despite passing a stone recently. See last note. She describes the pain as a 'pinching sensation' that was initially located in the back and radiated to the groin. The pain was intense and required painkillers for relief. After passing the stone, the pain 'went off like a switch' and she experienced 'immense relief.' However, she now experiences pain when lying on her left side, which intensifies the longer she remains in that position. The pain is described as a 'burrowing pain' that becomes unbearable, forcing her to move. She denies any fevers or chills.     Assessment  1. Left flank pain   2. Nephrolithiasis   3. Left lower quadrant abdominal pain      Plan  Assessment and Plan    Nephrolithiasis Passed a small kidney stone on Monday, relieving intense pain experienced for two weeks. Currently has left-sided pain when lying down, without hematuria. Previous urine test unreliable due to Azo. No history of stent placement. Stones typically 2-3 mm. Afebrile, no chills. Further evaluation needed to check for additional stones and assess renal condition. Flomax discussed to aid stone passage. Urology referral if additional stones are found. - Order renal CT to assess for stones and kidney condition - Order blood work - Order urine test and culture - Prescribe Flomax -  Refer to urologist if additional stones are found  Follow-up - Provide urine sample at the lab - Call with results and further instructions.      Orders Placed This Encounter  Procedures   Urine Culture   CT RENAL STONE STUDY   CBC with Differential/Platelet   Basic metabolic panel   Urinalysis, Routine w reflex microscopic   Meds ordered this encounter  Medications   tamsulosin (FLOMAX) 0.4 MG CAPS capsule    Sig: Take 1 capsule (0.4 mg total) by mouth daily.    Dispense:  30 capsule    Refill:  0     I reviewed the patients updated PMH, FH, and SocHx.    Patient Active Problem List   Diagnosis Date Noted   Family history of breast cancer 01/22/2021    Priority: High   Leukocytosis 01/22/2021    Priority: Medium    Nephrolithiasis 01/22/2021    Priority: Medium    Obesity (BMI 30-39.9) 01/22/2021    Priority: Medium    Chronic allergic rhinitis 05/17/2014    Priority: Low   Current Meds  Medication Sig   cyclobenzaprine (FLEXERIL) 10 MG tablet Take 1 tablet (10 mg total) by mouth at bedtime as needed for muscle spasms.   diclofenac (VOLTAREN) 75 MG EC tablet Take 1 tablet (75 mg total) by mouth 2 (two) times daily as needed for moderate pain (pain score 4-6).   MELATONIN PO Take by mouth.   traMADol Janean Sark)  50 MG tablet Take 1 tablet (50 mg total) by mouth every 8 (eight) hours as needed.   tamsulosin (FLOMAX) 0.4 MG CAPS capsule Take 1 capsule (0.4 mg total) by mouth daily.    Allergies: Patient is allergic to robaxin [methocarbamol]. Family History: Patient family history includes Breast cancer in her paternal aunt, paternal aunt, and paternal aunt; COPD in her mother; Cancer in her paternal grandmother; Epilepsy in her mother; Heart attack in her mother; Heart disease in her mother; Multiple sclerosis in her maternal grandmother; Obesity in her mother; Stroke in her mother. Social History:  Patient  reports that she has never smoked. She has never used  smokeless tobacco. She reports current alcohol use. She reports that she does not use drugs.  Review of Systems: Constitutional: Negative for fever malaise or anorexia Cardiovascular: negative for chest pain Respiratory: negative for SOB or persistent cough Gastrointestinal: negative for abdominal pain  Objective  Vitals: BP 114/64   Pulse 80   Temp 97.8 F (36.6 C)   Ht 5\' 7"  (1.702 m)   Wt 236 lb 6.4 oz (107.2 kg)   LMP 08/23/2016   SpO2 98%   BMI 37.03 kg/m  General: no acute distress , A&Ox3   Commons side effects, risks, benefits, and alternatives for medications and treatment plan prescribed today were discussed, and the patient expressed understanding of the given instructions. Patient is instructed to call or message via MyChart if he/she has any questions or concerns regarding our treatment plan. No barriers to understanding were identified. We discussed Red Flag symptoms and signs in detail. Patient expressed understanding regarding what to do in case of urgent or emergency type symptoms.  Medication list was reconciled, printed and provided to the patient in AVS. Patient instructions and summary information was reviewed with the patient as documented in the AVS. This note was prepared with assistance of Dragon voice recognition software. Occasional wrong-word or sound-a-like substitutions may have occurred due to the inherent limitations of voice recognition software

## 2023-12-18 NOTE — Telephone Encounter (Signed)
Copied from CRM [id number]. Topic: General - Other >> Dec 18, 2023 10:24 AM Dennison Nancy wrote: Reason for CRM: [facility] ask to speak authorization team regarding Jacynda Klinger

## 2023-12-18 NOTE — Patient Instructions (Signed)
Please follow up as scheduled for your next visit with me: 02/19/2024   I will release your lab results to you on your MyChart account with further instructions. You may see the results before I do, but when I review them I will send you a message with my report or have my assistant call you if things need to be discussed. Please reply to my message with any questions. Thank you!   If you have any questions or concerns, please don't hesitate to send me a message via MyChart or call the office at [phone]. Thank you for visiting with [place] today! It's our pleasure caring for you.   VISIT SUMMARY:  You visited [place] today due to persistent left-sided pain after recently passing a kidney stone. The pain initially started in your back and radiated to your groin, but after passing the stone, you experienced significant relief. However, you now have a 'burrowing pain' when lying on your left side, which becomes unbearable if you stay in that position for too long. You have no fever or chills.  YOUR PLAN:  -NEPHROLITHIASIS: Nephrolithiasis refers to the condition of having kidney stones, which are hard deposits made of minerals and salts that form inside your kidneys. You recently passed a small stone, which relieved your intense pain, but you are now experiencing left-sided pain when lying down. We will conduct a renal CT scan to check for any additional stones and assess your kidney condition. Blood work and a urine test with culture will also be done. We have prescribed Flomax to help with the passage of any remaining stones. If additional stones are found, we will refer you to a urologist for further evaluation.  INSTRUCTIONS:  Please provide a urine sample at the lab as soon as possible. We will call you with the results and provide further instructions based on the findings.

## 2023-12-18 NOTE — Telephone Encounter (Signed)
Copied from CRM [id number]. Topic: General - Other >> Dec 18, 2023 10:24 AM Dennison Nancy wrote: Reason for CRM: [facility] ask to speak authorization team regarding Syvannah Prive  This message has been sent to Laverle Patter to F/U on

## 2023-12-19 LAB — URINE CULTURE
MICRO NUMBER:: 15871958
Result:: NO GROWTH
SPECIMEN QUALITY:: ADEQUATE

## 2023-12-26 ENCOUNTER — Ambulatory Visit
Admission: RE | Admit: 2023-12-26 | Discharge: 2023-12-26 | Disposition: A | Discharge status: Home / Self Care | Payer: Commercial Managed Care - HMO | Source: Ambulatory Visit | Attending: Family Medicine | Admitting: Family Medicine

## 2023-12-26 DIAGNOSIS — R109 Unspecified abdominal pain: Secondary | ICD-10-CM

## 2023-12-26 DIAGNOSIS — N200 Calculus of kidney: Secondary | ICD-10-CM

## 2023-12-26 DIAGNOSIS — R1032 Left lower quadrant pain: Secondary | ICD-10-CM

## 2023-12-29 NOTE — Progress Notes (Signed)
See mychart note Dear Ms. Sedgwick, How are you feeling? Your CT scan does not show any stones at this time. You should be feeling better now. Let me know if you aren't improving.  Sincerely, Dr. Mardelle Matte

## 2024-01-20 ENCOUNTER — Other Ambulatory Visit: Payer: Self-pay | Admitting: Family Medicine

## 2024-01-23 ENCOUNTER — Other Ambulatory Visit: Payer: Self-pay | Admitting: Family Medicine

## 2024-02-02 ENCOUNTER — Telehealth: Payer: Commercial Managed Care - HMO | Admitting: Physician Assistant

## 2024-02-02 DIAGNOSIS — Z0000 Encounter for general adult medical examination without abnormal findings: Secondary | ICD-10-CM

## 2024-02-02 NOTE — Progress Notes (Signed)
Because of needing paperwork, I feel your condition warrants further evaluation and I recommend that you be seen for a face to face visit.  Please contact your primary care physician practice to be seen. Many offices offer virtual options to be seen via video if you are not comfortable going in person to a medical facility at this time.  NOTE: You will NOT be charged for this eVisit.  If you do not have a PCP, [facility] offers a free physician referral service available at [phone]. Our trained staff has the experience, knowledge and resources to put you in touch with a physician who is right for you.    If you are having a true medical emergency please call 911.   Your e-visit answers were reviewed by a board certified advanced clinical practitioner to complete your personal care plan.  Thank you for using e-Visits.

## 2024-02-09 ENCOUNTER — Encounter: Payer: Commercial Managed Care - HMO | Admitting: Family Medicine

## 2024-02-17 ENCOUNTER — Telehealth: Payer: Self-pay | Admitting: Family Medicine

## 2024-02-17 NOTE — Telephone Encounter (Signed)
 Patient dropped off document health form , to be filled out by provider. Patient requested to send it back via Call Patient to pick up within 5-days. Document is located in providers tray at front office.Please advise at Mobile [phone] (mobile)

## 2024-02-19 ENCOUNTER — Telehealth: Payer: Self-pay

## 2024-02-19 ENCOUNTER — Ambulatory Visit: Payer: Commercial Managed Care - HMO | Admitting: Family Medicine

## 2024-02-19 ENCOUNTER — Encounter: Payer: Self-pay | Admitting: Family Medicine

## 2024-02-19 ENCOUNTER — Encounter: Payer: Commercial Managed Care - HMO | Admitting: Family Medicine

## 2024-02-19 VITALS — BP 123/79 | HR 100 | Temp 97.8°F | Ht 67.0 in | Wt 236.6 lb

## 2024-02-19 DIAGNOSIS — R102 Pelvic and perineal pain: Secondary | ICD-10-CM

## 2024-02-19 DIAGNOSIS — Z0000 Encounter for general adult medical examination without abnormal findings: Secondary | ICD-10-CM

## 2024-02-19 DIAGNOSIS — Z6837 Body mass index (BMI) 37.0-37.9, adult: Secondary | ICD-10-CM

## 2024-02-19 DIAGNOSIS — Z23 Encounter for immunization: Secondary | ICD-10-CM

## 2024-02-19 DIAGNOSIS — Z1212 Encounter for screening for malignant neoplasm of rectum: Secondary | ICD-10-CM

## 2024-02-19 DIAGNOSIS — D72829 Elevated white blood cell count, unspecified: Secondary | ICD-10-CM

## 2024-02-19 DIAGNOSIS — Z13220 Encounter for screening for lipoid disorders: Secondary | ICD-10-CM | POA: Diagnosis not present

## 2024-02-19 DIAGNOSIS — N200 Calculus of kidney: Secondary | ICD-10-CM

## 2024-02-19 DIAGNOSIS — Z0001 Encounter for general adult medical examination with abnormal findings: Secondary | ICD-10-CM | POA: Diagnosis not present

## 2024-02-19 DIAGNOSIS — E669 Obesity, unspecified: Secondary | ICD-10-CM

## 2024-02-19 LAB — LIPID PANEL
Cholesterol: 272 mg/dL — ABNORMAL HIGH (ref 0–200)
HDL: 37.1 mg/dL — ABNORMAL LOW (ref >39.00)
LDL Cholesterol: 159 mg/dL — ABNORMAL HIGH (ref 0–99)
NonHDL: 235.04
Total CHOL/HDL Ratio: 7
Triglycerides: 380 mg/dL — ABNORMAL HIGH (ref 0.0–149.0)
VLDL: 76 mg/dL — ABNORMAL HIGH (ref 0.0–40.0)

## 2024-02-19 LAB — CBC WITH DIFFERENTIAL/PLATELET
Basophils Absolute: 0.1 10*3/uL (ref 0.0–0.1)
Basophils Relative: 0.6 % (ref 0.0–3.0)
Eosinophils Absolute: 0.1 10*3/uL (ref 0.0–0.7)
Eosinophils Relative: 1.3 % (ref 0.0–5.0)
HCT: 41.9 % (ref 36.0–46.0)
Hemoglobin: 14 g/dL (ref 12.0–15.0)
Lymphocytes Relative: 28.5 % (ref 12.0–46.0)
Lymphs Abs: 2.7 10*3/uL (ref 0.7–4.0)
MCHC: 33.3 g/dL (ref 30.0–36.0)
MCV: 86.4 fL (ref 78.0–100.0)
Monocytes Absolute: 0.5 10*3/uL (ref 0.1–1.0)
Monocytes Relative: 4.8 % (ref 3.0–12.0)
Neutro Abs: 6.2 10*3/uL (ref 1.4–7.7)
Neutrophils Relative %: 64.8 % (ref 43.0–77.0)
Platelets: 431 10*3/uL — ABNORMAL HIGH (ref 150.0–400.0)
RBC: 4.84 Mil/uL (ref 3.87–5.11)
RDW: 13.2 % (ref 11.5–15.5)
WBC: 9.6 10*3/uL (ref 4.0–10.5)

## 2024-02-19 LAB — COMPREHENSIVE METABOLIC PANEL
ALT: 20 U/L (ref 0–35)
AST: 21 U/L (ref 0–37)
Albumin: 4.6 g/dL (ref 3.5–5.2)
Alkaline Phosphatase: 91 U/L (ref 39–117)
BUN: 15 mg/dL (ref 6–23)
CO2: 26 meq/L (ref 19–32)
Calcium: 9.5 mg/dL (ref 8.4–10.5)
Chloride: 102 meq/L (ref 96–112)
Creatinine, Ser: 0.98 mg/dL (ref 0.40–1.20)
GFR: 66.75 mL/min (ref >60.00)
Glucose, Bld: 100 mg/dL — ABNORMAL HIGH (ref 70–99)
Potassium: 3.9 meq/L (ref 3.5–5.1)
Sodium: 138 meq/L (ref 135–145)
Total Bilirubin: 0.4 mg/dL (ref 0.2–1.2)
Total Protein: 7.7 g/dL (ref 6.0–8.3)

## 2024-02-19 LAB — TSH: TSH: 2.19 u[IU]/mL (ref 0.35–5.50)

## 2024-02-19 NOTE — Progress Notes (Signed)
 Subjective  Chief Complaint  Patient presents with   Annual Exam    Pt here for Annual Exam and is not currently fasting    HPI: Christina Mora is a 53 y.o. female who presents to [facility] [place] today for a Female Wellness Visit. She also has the concerns and/or needs as listed above in the chief complaint. These will be addressed in addition to the Health Maintenance Visit.   Wellness Visit: annual visit with health maintenance review and exam  HM: mammo due in may but pt is hesitant since has had need for f/u w/ biopsy in recent screens. Overdue for CRC screen: had cologuard kit at home. S/p partial hysterectomy 2017, no need for pap. Eating better and started walking for exercise. Trying to lose weight. Eligible for 2nd shingrix vaccine. Declines flu vaccine. Chronic disease f/u and/or acute problem visit: (deemed necessary to be done in addition to the wellness visit): C/o 3-4 day of midline pelvic pain: sharp, moderate to severe and improved over the last 24 hours after taking otc progesterone only birth control pill, alleve and tramadol. Denies f/c/s, bleeding, vaginal discharge, n/v/d or constipation or urinary sxs. Has h/o kidney stone but this was dissimilar in nature. Feeling better today.  H/o elevated wbc but normal last year.  Reviewed renal ct scan: renal cyst w/o worrisome features. No stones. No masses.  Assessment  1. Annual physical exam   2. Screening for colorectal cancer   3. Obesity (BMI 30-39.9)   4. Pelvic pain   5. Nephrolithiasis   6. Leukocytosis, unspecified type      Plan  Female Wellness Visit: Age appropriate Health Maintenance and Prevention measures were discussed with patient. Included topics are cancer screening recommendations, ways to keep healthy (see AVS) including dietary and exercise recommendations, regular eye and dental care, use of seat belts, and avoidance of moderate alcohol use and tobacco use. Pt to complete  cologuard. Recommend mammo in may.  BMI: discussed patient's BMI and encouraged positive lifestyle modifications to help get to or maintain a target BMI. HM needs and immunizations were addressed and ordered. See below for orders. See HM and immunization section for updates. 2nd shingrix given in office today Routine labs and screening tests ordered including cmp, cbc and lipids where appropriate. Discussed recommendations regarding Vit D and calcium supplementation (see AVS)  Chronic disease management visit and/or acute problem visit: Obesity: continue diet and exercise. Start strength training Monitor wbc Pelvic pain: unclear etiology. Rec d/c of progesterone. Monitor. Benign exam today. Return if recurs H/o kidney stones. Good hydration recommended.   Follow up: 12 mo for cpe  Orders Placed This Encounter  Procedures   Zoster Recombinant (Shingrix )   Lipid panel   Comprehensive metabolic panel   CBC with Differential/Platelet   Cologuard   TSH   No orders of the defined types were placed in this encounter.     Body mass index is 37.06 kg/m. Wt Readings from Last 3 Encounters:  02/19/24 236 lb 9.6 oz (107.3 kg)  12/18/23 236 lb 6.4 oz (107.2 kg)  12/08/23 237 lb 9.6 oz (107.8 kg)     Patient Active Problem List   Diagnosis Date Noted Date Diagnosed   Family history of breast cancer 01/22/2021     Priority: High   Leukocytosis 01/22/2021     Priority: Medium     Persistent since 2015. ? If ever evaluated    Nephrolithiasis 01/22/2021  Priority: Medium    Obesity (BMI 30-39.9) 01/22/2021     Priority: Medium    Chronic allergic rhinitis 05/17/2014     Priority: Low   Health Maintenance  Topic Date Due   Zoster Vaccines- Shingrix (2 of 2) 02/13/2023   Fecal DNA (Cologuard)  07/09/2023   COVID-19 Vaccine (4 - 2024-25 season) 03/06/2024 (Originally 08/31/2023)   INFLUENZA VACCINE  03/29/2024 (Originally 07/31/2023)   MAMMOGRAM  05/21/2024   DTaP/Tdap/Td (3 - Td  or Tdap) 01/22/2031   Hepatitis C Screening  Completed   HIV Screening  Completed   HPV VACCINES  Aged Out   Immunization History  Administered Date(s) Administered   Influenza Inj Mdck Quad Pf 09/19/2018   Influenza,inj,Quad PF,6+ Mos 01/26/2016, 10/29/2017, 10/10/2019, 12/19/2022   Influenza-Unspecified 10/02/2018, 11/28/2020   PFIZER(Purple Top)SARS-COV-2 Vaccination 03/04/2020, 04/07/2020, 10/29/2020   Tdap 12/30/2010, 01/22/2021   Zoster Recombinant(Shingrix) 12/19/2022   We updated and reviewed the patient's past history in detail and it is documented below. Allergies: Patient is allergic to robaxin [methocarbamol]. Past Medical History Patient  has a past medical history of Adjustment reaction with anxiety (01/22/2021), Allergy, Elevated WBC count (2017), Fibroids, Nephrolithiasis (01/22/2021), Pre-diabetes, and SVD (spontaneous vaginal delivery). Past Surgical History Patient  has a past surgical history that includes Fracture surgery; Eye surgery (Bilateral); Dilation and curettage of uterus; Hysterectomy abdominal with salpingectomy (Bilateral, 09/24/2016); and Abdominal hysterectomy (09/24/2016). Family History: Patient family history includes Breast cancer in her paternal aunt, paternal aunt, and paternal aunt; COPD in her mother; Cancer in her paternal grandmother; Epilepsy in her mother; Heart attack in her mother; Heart disease in her mother; Multiple sclerosis in her maternal grandmother; Obesity in her mother; Stroke in her mother. Social History:  Patient  reports that she has never smoked. She has never used smokeless tobacco. She reports current alcohol use. She reports that she does not use drugs.  Review of Systems: Constitutional: negative for fever or malaise Ophthalmic: negative for photophobia, double vision or loss of vision Cardiovascular: negative for chest pain, dyspnea on exertion, or new LE swelling Respiratory: negative for SOB or persistent  cough Gastrointestinal: negative for abdominal pain, change in bowel habits or melena Genitourinary: negative for dysuria or gross hematuria, no abnormal uterine bleeding or disharge Musculoskeletal: negative for new gait disturbance or muscular weakness Integumentary: negative for new or persistent rashes, no breast lumps Neurological: negative for TIA or stroke symptoms Psychiatric: negative for SI or delusions Allergic/Immunologic: negative for hives  Patient Care Team    Relationship Specialty Notifications Start End  Willow Ora, MD PCP - General Family Medicine  01/22/21   Sheran Luz, MD Consulting Physician Physical Medicine and Rehabilitation  01/22/21     Objective  Vitals: BP 123/79   Pulse 100   Temp 97.8 F (36.6 C)   Ht 5\' 7"  (1.702 m)   Wt 236 lb 9.6 oz (107.3 kg)   LMP 08/23/2016   SpO2 98%   BMI 37.06 kg/m  General:  Well developed, well nourished, no acute distress  Psych:  Alert and orientedx3,normal mood and affect HEENT:  Normocephalic, atraumatic, non-icteric sclera,  supple neck without adenopathy, mass or thyromegaly Cardiovascular:  Normal S1, S2, RRR without gallop, rub or murmur Respiratory:  Good breath sounds bilaterally, CTAB with normal respiratory effort Gastrointestinal: normal bowel sounds, soft, non-tender, no noted masses. No HSM MSK: extremities without edema, joints without erythema or swelling Neurologic:    Mental status is normal.  Gross motor and sensory exams are  normal.  No tremor  Commons side effects, risks, benefits, and alternatives for medications and treatment plan prescribed today were discussed, and the patient expressed understanding of the given instructions. Patient is instructed to call or message via MyChart if he/she has any questions or concerns regarding our treatment plan. No barriers to understanding were identified. We discussed Red Flag symptoms and signs in detail. Patient expressed understanding regarding what  to do in case of urgent or emergency type symptoms.  Medication list was reconciled, printed and provided to the patient in AVS. Patient instructions and summary information was reviewed with the patient as documented in the AVS. This note was prepared with assistance of Dragon voice recognition software. Occasional wrong-word or sound-a-like substitutions may have occurred due to the inherent limitations of voice recognition software

## 2024-02-19 NOTE — Patient Instructions (Signed)
 Please return in 12 months for your annual complete physical; please come fasting.   I will release your lab results to you on your MyChart account with further instructions. You may see the results before I do, but when I review them I will send you a message with my report or have my assistant call you if things need to be discussed. Please reply to my message with any questions. Thank you!   If you have any questions or concerns, please don't hesitate to send me a message via MyChart or call the office at [phone]. Thank you for visiting with [place] today! It's our pleasure caring for you.   Please complete your cologuard test  Pelvic Pain, Female Pelvic pain is pain in your lower abdomen, below your belly button and between your hips. The pain may start suddenly (be acute), keep coming back (be recurring), or last a long time (become chronic). Pelvic pain that lasts longer than 6 months is considered chronic. Pelvic pain may affect your: Reproductive organs. Urinary system. Digestive tract. Musculoskeletal system. There are many potential causes of pelvic pain. Sometimes, the pain can be a result of digestive or urinary conditions, strained muscles or ligaments, or reproductive conditions. Sometimes the cause of pelvic pain is not known. Follow these instructions at home:  Take over-the-counter and prescription medicines only as told by your health care provider. Rest as told by your health care provider. Do not have sex if it hurts. Keep a journal of your pelvic pain. Write down: When the pain started. Where the pain is located. What seems to make the pain better or worse, such as food or your monthly period (menstrual cycle). Any symptoms you have along with the pain. Keep all follow-up visits. This is important. Contact a health care provider if: Medicine does not help your pain, or your pain comes back. You have new symptoms. You have abnormal vaginal discharge or bleeding,  including bleeding after menopause. You have a fever or chills. You are constipated. You have blood in your urine or stool (feces). You have foul-smelling urine. You feel weak or light-headed. Get help right away if: You have sudden severe pain. Your pain gets steadily worse. You have severe pain along with fever, nausea, vomiting, or excessive sweating. You lose consciousness. These symptoms may represent a serious problem that is an emergency. Do not wait to see if the symptoms will go away. Get medical help right away. Call your local emergency services (911 in the [place] Do not drive yourself to the hospital. Summary Pelvic pain is pain in your lower abdomen, below your belly button and between your hips. There are many potential causes of pelvic pain. Keep a journal of your pelvic pain. This information is not intended to replace advice given to you by your health care provider. Make sure you discuss any questions you have with your health care provider. Document Revised: 04/24/2021 Document Reviewed: 04/24/2021 Elsevier Patient Education  2024 [employer].

## 2024-02-19 NOTE — Telephone Encounter (Signed)
 Pt had an appt today 02/19/2024 and form was given back to her

## 2024-02-19 NOTE — Telephone Encounter (Signed)
 Copied from CRM [id number]. Topic: General - Other >> Feb 18, 2024 12:56 PM [place] A wrote: Reason for CRM: Patient called to follow-up on form given yesterday to be completed. Agent informed patient that the office closed at 12PM today but form will completed in a timely manner  Pt had an OV today and form was given to her

## 2024-03-01 ENCOUNTER — Encounter: Payer: Self-pay | Admitting: Family Medicine

## 2024-03-01 NOTE — Progress Notes (Signed)
 Labs reviewed. Non fasting labs with high trigs and sugar The 10-year ASCVD risk score (Arnett DK, et al., 2019) is: 4.1%   Values used to calculate the score:     Age: 53 years     Sex: Female     Is Non-Hispanic African American: Yes     Diabetic: No     Tobacco smoker: No     Systolic Blood Pressure: 123 mmHg     Is BP treated: No     HDL Cholesterol: 37.1 mg/dL     Total Cholesterol: 272 mg/dL

## 2024-03-03 ENCOUNTER — Telehealth: Payer: Self-pay

## 2024-03-03 NOTE — Telephone Encounter (Signed)
 Copied from CRM [id number]. Topic: Appointments - Appointment Scheduling >> Mar 03, 2024  1:03 PM Rodman Pickle T wrote: Patient/patient representative is calling to schedule an appointment. Refer to attachments for appointment information.     Patient is having kidney pain and would like to be seen today.   Please check schedule to see who has an appt or if she is ok with seeing another provider.Or if andy has an opening and get her scheduled   Message has been sent to [facility]

## 2024-03-03 NOTE — Telephone Encounter (Signed)
 Copied from CRM [id number]. Topic: Appointments - Appointment Scheduling >> Mar 03, 2024  1:03 PM Rodman Pickle T wrote: Patient/patient representative is calling to schedule an appointment. Refer to attachments for appointment information.     Patient is having kidney pain and would like to be seen today

## 2024-03-05 ENCOUNTER — Ambulatory Visit: Admitting: Family Medicine

## 2024-03-05 ENCOUNTER — Ambulatory Visit (INDEPENDENT_AMBULATORY_CARE_PROVIDER_SITE_OTHER)

## 2024-03-05 ENCOUNTER — Encounter: Payer: Self-pay | Admitting: Family Medicine

## 2024-03-05 VITALS — BP 108/60 | HR 89 | Temp 97.8°F | Ht 67.0 in | Wt 235.4 lb

## 2024-03-05 DIAGNOSIS — M545 Low back pain, unspecified: Secondary | ICD-10-CM

## 2024-03-05 DIAGNOSIS — R109 Unspecified abdominal pain: Secondary | ICD-10-CM

## 2024-03-05 DIAGNOSIS — N200 Calculus of kidney: Secondary | ICD-10-CM | POA: Diagnosis not present

## 2024-03-05 DIAGNOSIS — D75839 Thrombocytosis, unspecified: Secondary | ICD-10-CM | POA: Insufficient documentation

## 2024-03-05 LAB — URINALYSIS, ROUTINE W REFLEX MICROSCOPIC
Bilirubin Urine: NEGATIVE
Ketones, ur: NEGATIVE
Leukocytes,Ua: NEGATIVE
Nitrite: NEGATIVE
Specific Gravity, Urine: <=1.005 — AB (ref 1.000–1.030)
Total Protein, Urine: NEGATIVE
Urine Glucose: NEGATIVE
Urobilinogen, UA: 0.2 (ref 0.0–1.0)
pH: 6.5 (ref 5.0–8.0)

## 2024-03-05 MED ORDER — TAMSULOSIN HCL 0.4 MG PO CAPS: 0.4000 mg | ORAL_CAPSULE | Freq: Every day | ORAL | 0 refills | Status: DC

## 2024-03-05 MED ORDER — TRAMADOL HCL 50 MG PO TABS: 50.0000 mg | ORAL_TABLET | Freq: Three times a day (TID) | ORAL | 0 refills | Status: DC | PRN

## 2024-03-05 MED ORDER — KETOROLAC TROMETHAMINE 60 MG/2ML IM SOLN
60.0000 mg | Freq: Once | INTRAMUSCULAR | Status: AC
  Administered 2024-03-05: 60 mg via INTRAMUSCULAR

## 2024-03-05 NOTE — Progress Notes (Signed)
 Subjective  CC:  Chief Complaint  Patient presents with   Abdominal Pain    HPI: Christina Mora is a 53 y.o. female who presents to the office today to address the problems listed above in the chief complaint. Discussed the use of AI scribe software for clinical note transcription with the patient, who gave verbal consent to proceed.  History of Present Illness   The patient presents with persistent right-sided back pain.  She has been experiencing persistent right-sided back pain for the past two weeks. The pain is described as a 'digging, boring pain' that is present even when she is still, such as when lying in bed. It intensifies at the end of a deep breath but is not associated with movement. She rates the pain as a 5 to 6 out of 10, which is less severe than her previous kidney stone pain, which she rated as a 9 to 10 out of 10.  She has a history of kidney stones over the past two to three years, with episodes of severe pain that have been imaged and confirmed as stones on two occasions. Although she has not caught a stone, the pain typically resolves suddenly after taking medications, suggesting passage of the stone.  For the current pain, she has been taking Advil and Aleve, which provide some relief, but she still experiences significant discomfort, especially at night, affecting her sleep. In the past, she has taken medications such as Flomax and Tramadol, which have helped with kidney stone pain. She also mentions taking cyclobenzaprine, which aids in sleep but does not alleviate the pain.  No blood in the urine or urinary symptoms during the current episode. She reports tenderness in the right side of her back, which radiates downwards.  She has a two-year history of low back pain, which she initially thought was related to kidney stones. However, she now believes the current pain may be different, as it is less severe and more persistent. She has not had a comprehensive  evaluation of her back pain in the past.       Assessment  1. Right flank pain   2. Nephrolithiasis   3. Low back pain, episodic      Plan  Assessment and Plan    Right-sided flank pain Persistent right-sided flank pain with differential diagnosis of nephrolithiasis and musculoskeletal back pain. Pain atypical for nephrolithiasis. - Order renal CT to evaluate for nephrolithiasis. - Administer Toradol injection for pain relief. - Prescribe tramadol for pain management. - Prescribe Flomax to facilitate stone passage. - Check urinalysis. - Obtain x-rays of the lumbar spine. - Advise to keep appointment with [facility] for further evaluation.  Elevated platelet count Elevated platelet count noted. - Review lab results to assess the significance of elevated platelet count.  General Health Maintenance Concerns about cholesterol and fat levels related to diet and exercise. - Advise on diet and exercise to manage cholesterol and fat levels.        Orders Placed This Encounter  Procedures   DG Lumbar Spine Complete   DG Abd 1 View   Urinalysis, Routine w reflex microscopic   Meds ordered this encounter  Medications   ketorolac (TORADOL) injection 60 mg   tamsulosin (FLOMAX) 0.4 MG CAPS capsule    Sig: Take 1 capsule (0.4 mg total) by mouth daily.    Dispense:  30 capsule    Refill:  0   traMADol (ULTRAM) 50 MG tablet    Sig: Take 1  tablet (50 mg total) by mouth every 8 (eight) hours as needed.    Dispense:  20 tablet    Refill:  0     I reviewed the patients updated PMH, FH, and SocHx.    Patient Active Problem List   Diagnosis Date Noted   Family history of breast cancer 01/22/2021    Priority: High   Leukocytosis 01/22/2021    Priority: Medium    Nephrolithiasis 01/22/2021    Priority: Medium    Obesity (BMI 30-39.9) 01/22/2021    Priority: Medium    Chronic allergic rhinitis 05/17/2014    Priority: Low   Thrombocytosis 03/05/2024   Current Meds   Medication Sig   cyclobenzaprine (FLEXERIL) 10 MG tablet TAKE 1 TABLET BY MOUTH AT BEDTIME AS NEEDED FOR MUSCLE SPASMS   MELATONIN PO Take by mouth.    Allergies: Patient is allergic to robaxin [methocarbamol]. Family History: Patient family history includes Breast cancer in her paternal aunt, paternal aunt, and paternal aunt; COPD in her mother; Cancer in her paternal grandmother; Epilepsy in her mother; Heart attack in her mother; Heart disease in her mother; Multiple sclerosis in her maternal grandmother; Obesity in her mother; Stroke in her mother. Social History:  Patient  reports that she has never smoked. She has never used smokeless tobacco. She reports current alcohol use. She reports that she does not use drugs.  Review of Systems: Constitutional: Negative for fever malaise or anorexia Cardiovascular: negative for chest pain Respiratory: negative for SOB or persistent cough Gastrointestinal: negative for abdominal pain  Objective  Vitals: BP 108/60   Pulse 89   Temp 97.8 F (36.6 C)   Ht 5\' 7"  (1.702 m)   Wt 235 lb 6.4 oz (106.8 kg)   LMP 08/23/2016   SpO2 98%   BMI 36.87 kg/m  General: appears slightly uncomfortable, A&Ox3 HEENT: PEERL, conjunctiva normal, neck is supple Cardiovascular:  RRR without murmur or gallop.  Respiratory:  Good breath sounds bilaterally, CTAB with normal respiratory effort Abd; no CVAT, soft, mild rlq ttp w/o rebound guarding or mass Back: + right lower lumbar ttp nl gait Skin:  Warm, no rashes  Office Visit on 03/05/2024  Component Date Value Ref Range Status   Color, Urine 03/05/2024 YELLOW  Yellow;Lt. Yellow;Straw;Dark Yellow;Amber;Green;Red;Brown Final   APPearance 03/05/2024 CLEAR  Clear;Turbid;Slightly Cloudy;Cloudy Final   Specific Gravity, Urine 03/05/2024 <=1.005 (A)  1.000 - 1.030 Final   pH 03/05/2024 6.5  5.0 - 8.0 Final   Total Protein, Urine 03/05/2024 NEGATIVE  Negative Final   Urine Glucose 03/05/2024 NEGATIVE   Negative Final   Ketones, ur 03/05/2024 NEGATIVE  Negative Final   Bilirubin Urine 03/05/2024 NEGATIVE  Negative Final   Hgb urine dipstick 03/05/2024 SMALL (A)  Negative Final   Urobilinogen, UA 03/05/2024 0.2  0.0 - 1.0 Final   Leukocytes,Ua 03/05/2024 NEGATIVE  Negative Final   Nitrite 03/05/2024 NEGATIVE  Negative Final   WBC, UA 03/05/2024 0-2/hpf  0-2/hpf Final   RBC / HPF 03/05/2024 3-6/hpf (A)  0-2/hpf Final    Commons side effects, risks, benefits, and alternatives for medications and treatment plan prescribed today were discussed, and the patient expressed understanding of the given instructions. Patient is instructed to call or message via MyChart if he/she has any questions or concerns regarding our treatment plan. No barriers to understanding were identified. We discussed Red Flag symptoms and signs in detail. Patient expressed understanding regarding what to do in case of urgent or emergency type symptoms.  Medication  list was reconciled, printed and provided to the patient in AVS. Patient instructions and summary information was reviewed with the patient as documented in the AVS. This note was prepared with assistance of Dragon voice recognition software. Occasional wrong-word or sound-a-like substitutions may have occurred due to the inherent limitations of voice recognition software

## 2024-03-05 NOTE — Progress Notes (Signed)
 See mychart note Dear [name], Your urine test shows a few red blood cells; this could correlate with an active kidney stone as we discussed. Use the medications, flush the kidneys and let's see if it passes before Monday. Regardless, keep the appointment with urology on Monday for further evaluation.  Sincerely, Dr. Mardelle Matte

## 2024-03-05 NOTE — Patient Instructions (Signed)
 Please follow up if symptoms do not improve or as needed.    VISIT SUMMARY:  You came in today because of persistent right-sided back pain that you've been experiencing for the past two weeks. The pain is described as a 'digging, boring pain' that is present even when you are still, and it intensifies at the end of a deep breath. You have a history of kidney stones, but this pain is different from your previous episodes. You have been taking Advil and Aleve for relief, but the pain still affects your sleep. You also mentioned taking cyclobenzaprine, which helps with sleep but not the pain. There are no urinary symptoms or blood in your urine during this episode.  YOUR PLAN:  -RIGHT-SIDED FLANK PAIN: Right-sided flank pain can be due to kidney stones or musculoskeletal issues. We will order a xray of your abdomen to check for kidney stones and give you a Toradol injection for pain relief. You will also be prescribed tramadol for pain management and Flomax to help pass any potential stones. A urinalysis will be done, and x-rays of your lumbar spine will be taken. Please keep your appointment with [facility] for further evaluation.  -GENERAL HEALTH MAINTENANCE: We discussed your concerns about cholesterol and fat levels related to diet and exercise. It is important to maintain a healthy diet and regular exercise to manage these levels.  INSTRUCTIONS:  Please follow up  urinalysis, and abdominal and  lumbar spine x-rays as ordered. Keep your appointment with [facility] for further evaluation. Continue to monitor your diet and exercise to manage cholesterol and fat levels.

## 2024-03-09 ENCOUNTER — Encounter: Payer: Self-pay | Admitting: Family Medicine

## 2024-03-09 DIAGNOSIS — M545 Low back pain, unspecified: Secondary | ICD-10-CM

## 2024-03-09 LAB — COLOGUARD: COLOGUARD: NEGATIVE

## 2024-03-09 NOTE — Progress Notes (Signed)
Negative cologuard. Mc note sent

## 2024-03-11 ENCOUNTER — Other Ambulatory Visit (HOSPITAL_COMMUNITY): Payer: Self-pay

## 2024-03-11 ENCOUNTER — Telehealth: Payer: Self-pay

## 2024-03-11 MED ORDER — HYDROCODONE-ACETAMINOPHEN 5-325 MG PO TABS: 1.0000 | ORAL_TABLET | Freq: Two times a day (BID) | ORAL | 0 refills | Status: DC | PRN

## 2024-03-11 NOTE — Telephone Encounter (Signed)
 Pharmacy Patient Advocate Encounter  Received notification from [employer] that Prior Authorization for HYDROcodone-Acetaminophen 5-325MG  tablets  has been APPROVED from 3.13.25 to 3.13.26. Ran test claim, Copay is $6.64. This test claim was processed through [facility]- copay amounts may vary at other pharmacies due to pharmacy/plan contracts, or as the patient moves through the different stages of their insurance plan.   PA #/Case ID/Reference #: (Key: [id number])

## 2024-03-12 ENCOUNTER — Other Ambulatory Visit (HOSPITAL_COMMUNITY): Payer: Self-pay

## 2024-03-22 ENCOUNTER — Encounter: Payer: Self-pay | Admitting: Physical Medicine and Rehabilitation

## 2024-03-22 ENCOUNTER — Ambulatory Visit: Admitting: Physical Medicine and Rehabilitation

## 2024-03-22 DIAGNOSIS — M5441 Lumbago with sciatica, right side: Secondary | ICD-10-CM | POA: Diagnosis not present

## 2024-03-22 DIAGNOSIS — M546 Pain in thoracic spine: Secondary | ICD-10-CM

## 2024-03-22 DIAGNOSIS — R1031 Right lower quadrant pain: Secondary | ICD-10-CM | POA: Diagnosis not present

## 2024-03-22 NOTE — Progress Notes (Unsigned)
 Christina Mora - 53 y.o. female MRN [id number]  Date of birth: January 31, 1971  Office Visit Note: Visit Date: 03/22/2024 PCP: Willow Ora, MD Referred by: Willow Ora, MD  Subjective: Chief Complaint  Patient presents with   Lower Back - Pain   HPI: Christina Mora is a 53 y.o. female who comes in today per the request of Dr. Asencion Partridge for evaluation of acute bilateral lower back pain radiating around to right groin and up to bilateral thoracic region. Pain ongoing for 5 weeks. Her pain becomes severe when laying down to sleep, states she is unable to lay down on right side. Also reports severe pain with deep breathing and sneezing. Describes pain as digging and boring sensation, currently rates as 8 out of 10. Some relief of pain with home exercise regimen, rest and use of medications. Good relief of pain with Aleve and Norco. Does take Flexeril at bedtime. Walking and activity does seem to alleviate her pain. Recent lumbar radiographs show normal anatomical alignment, well preserved disc spacing, no spondylolisthesis. Radiographs of right femur from 2024 show old deformity proximal shaft of the femur. No acute fracture or malalignment. No prior MRI imaging of lumbar spine. She does have history of multiple kidney stones. CT renal study from December of 2024 is negative for renal calculi. Patient denies focal weakness, numbness and tingling. No recent trauma or falls.      Review of Systems  Musculoskeletal:  Positive for back pain and myalgias.  Neurological:  Negative for tingling, sensory change, focal weakness and weakness.  All other systems reviewed and are negative.  Otherwise per HPI.  Assessment & Plan: Visit Diagnoses:    ICD-10-CM   1. Acute bilateral low back pain with right-sided sciatica  M54.41 MR LUMBAR SPINE WO CONTRAST    Ambulatory referral to Physical Therapy    2. Groin pain, right  R10.31 MR LUMBAR SPINE WO CONTRAST    Ambulatory referral to Physical Therapy     3. Acute bilateral thoracic back pain  M54.6 MR LUMBAR SPINE WO CONTRAST    Ambulatory referral to Physical Therapy       Plan: Findings:  Acute bilateral lower back pain radiating around to right groin and up to bilateral thoracic region. Patient continues to have severe pain despite good conservative therapies such as home exercise regimen, rest and use of medications. Patients clinical presentation and exam are consistent with lumbar radiculopathy. Recent lumbar radiographs look fairly normal for age. I think this is less likely hip pathology, she has no pain with rotation of right hip. We discussed treatment plan in detail today, next step is to place order for lumbar MRI imaging. Depending on results of MRI imaging we discussed possibility of performing lumbar epidural steroid injections. I also placed order for short course of formal physical therapy. Patient has no questions at this time. She can continue with current medication regimen. I will see her back for lumbar MRI review. No red flag symptoms noted upon exam today.     Meds & Orders: No orders of the defined types were placed in this encounter.   Orders Placed This Encounter  Procedures   MR LUMBAR SPINE WO CONTRAST   Ambulatory referral to Physical Therapy    Follow-up: Return for MRI review after completion.   Procedures: No procedures performed      Clinical History: No specialty comments available.   She reports that she has never smoked. She has never used smokeless  tobacco. No results for input(s): "HGBA1C", "LABURIC" in the last 8760 hours.  Objective:  VS:  HT:    WT:   BMI:     BP:   HR: bpm  TEMP: ( )  RESP:  Physical Exam Vitals and nursing note reviewed.  HENT:     Head: Normocephalic and atraumatic.     Right Ear: External ear normal.     Left Ear: External ear normal.     Nose: Nose normal.     Mouth/Throat:     Mouth: Mucous membranes are moist.  Eyes:     Extraocular Movements:  Extraocular movements intact.  Cardiovascular:     Rate and Rhythm: Normal rate.     Pulses: Normal pulses.  Pulmonary:     Effort: Pulmonary effort is normal.  Abdominal:     General: Abdomen is flat. There is no distension.  Musculoskeletal:        General: Tenderness present.     Cervical back: Normal range of motion.     Comments: Patient rises from seated position to standing without difficulty. Good lumbar range of motion. No pain noted with facet loading. 5/5 strength noted with bilateral hip flexion, knee flexion/extension, ankle dorsiflexion/plantarflexion and EHL. No clonus noted bilaterally. No pain upon palpation of greater trochanters. No pain with internal/external rotation of bilateral hips. Sensation intact bilaterally. Negative slump test bilaterally. Ambulates without aid, gait steady.     Skin:    General: Skin is warm and dry.     Capillary Refill: Capillary refill takes less than 2 seconds.  Neurological:     General: No focal deficit present.     Mental Status: She is alert and oriented to person, place, and time.  Psychiatric:        Mood and Affect: Mood normal.        Behavior: Behavior normal.     Ortho Exam  Imaging: No results found.  Past Medical/Family/Surgical/Social History: Medications & Allergies reviewed per EMR, new medications updated. Patient Active Problem List   Diagnosis Date Noted   Thrombocytosis 03/05/2024   Leukocytosis 01/22/2021   Nephrolithiasis 01/22/2021   Family history of breast cancer 01/22/2021   Obesity (BMI 30-39.9) 01/22/2021   Chronic allergic rhinitis 05/17/2014   Past Medical History:  Diagnosis Date   Adjustment reaction with anxiety 01/22/2021   2017-2018; situational   Allergy    Loratadine PRN   Elevated WBC count 2017   Fibroids    Nephrolithiasis 01/22/2021   Pre-diabetes    SVD (spontaneous vaginal delivery)    x 1   Family History  Problem Relation Age of Onset   Stroke Mother    Heart disease  Mother        AMI x few with defibrillator/CHF   COPD Mother    Epilepsy Mother    Obesity Mother    Heart attack Mother    Multiple sclerosis Maternal Grandmother    Cancer Paternal Grandmother    Breast cancer Paternal Aunt    Breast cancer Paternal Aunt    Breast cancer Paternal Aunt    Past Surgical History:  Procedure Laterality Date   ABDOMINAL HYSTERECTOMY  09/24/2016   DILATION AND CURETTAGE OF UTERUS     x 4, 3 for heavy bleeding, 1 for MAB   EYE SURGERY Bilateral    as a child at age 28 or 4   FRACTURE SURGERY     R femur fracture age 67.   HYSTERECTOMY ABDOMINAL WITH SALPINGECTOMY  Bilateral 09/24/2016   Procedure: HYSTERECTOMY ABDOMINAL WITH Bilateral SALPINGECTOMY with Lysis of Adhesions;  Surgeon: Patton Salles, MD;  Location: [facility];  Service: Gynecology;  Laterality: Bilateral;  3 hours   Social History   Occupational History   Occupation: Psychologist, sport and exercise    Comment: daycare centers x 2    Employer: [employer]  Tobacco Use   Smoking status: Never   Smokeless tobacco: Never  Substance and Sexual Activity   Alcohol use: Yes    Alcohol/week: 0.0 standard drinks of alcohol    Comment: occasional wine    Drug use: No   Sexual activity: Yes    Partners: Female    Birth control/protection: None    Comment: female partner

## 2024-03-22 NOTE — Progress Notes (Unsigned)
 Pain Scale   Average Pain 7 Patient C/O Lower back pain radiation to groin area on right side

## 2024-03-24 ENCOUNTER — Encounter: Payer: Self-pay | Admitting: Family Medicine

## 2024-03-24 NOTE — Progress Notes (Signed)
 See mychart note Dear Ms. Hakes, I believe you reviewed these results with the orthopedist already. Your back xray looks good with only mild arthritic changes. I see you are going to get an MRI to see if we can help your pain. I'll follow along. Hope you are hanging in there. Sincerely, Dr. Mardelle Matte

## 2024-04-01 ENCOUNTER — Ambulatory Visit: Admitting: Physical Therapy

## 2024-04-03 ENCOUNTER — Other Ambulatory Visit

## 2024-04-08 ENCOUNTER — Other Ambulatory Visit

## 2024-05-04 ENCOUNTER — Ambulatory Visit: Payer: Self-pay

## 2024-05-04 NOTE — Telephone Encounter (Signed)
  Chief Complaint: Chest pain and numbness Symptoms: chest pain, numbness, sob Frequency: constant Pertinent Negatives: Patient denies other symptoms Disposition: [x] ED /[] Urgent Care (no appt availability in office) / [] Appointment(In office/virtual)/ []  [facility]/ [] Home Care/ [] Refused Recommended Disposition /[] [facility] [employer]/ []  Follow-up with PCP Additional Notes:  Spouse Dana calling. They work at a daycare, a short time ago while at work doing normal things she felt short of breath, she sat down and her chest became tight, her extremities began tingling and then numb. EMS was called, Avelyn reported to EMS her head, neck, & chest has a lot of pressure, she then felt like she was going to pass out, laid on floor and about 10 minutes later began regaining feeling in limbs, breathing easier. Evaluated by EMS, she declined transport and wants appointment. Still experiencing head and chest pressure, mild shortness of breath. Advised emergency room evaluation. Spouse is transporting to [employer], advised if symptoms increase while driving to call [phone] for EMS. Spouse Bernabe Brew verbalized understanding and in agreement with plan.   Copied from CRM [id number]. Topic: Clinical - Red Word Triage >> May 04, 2024  1:04 PM Magdalene School wrote: Red Word that prompted transfer to Nurse Triage: chest pain, numbness. Reason for Disposition  Dizziness or lightheadedness  Protocols used: Chest Pain-A-AH

## 2024-07-20 ENCOUNTER — Ambulatory Visit: Payer: Self-pay | Admitting: *Deleted

## 2024-07-20 NOTE — Telephone Encounter (Signed)
 FYI Only or Action Required?: Action required by provider: request for appointment.  Patient was last seen in primary care on 03/05/2024 by Jodie Lavern CROME, MD.  Called Nurse Triage reporting Urticaria.  Symptoms began several days ago.  Interventions attempted: OTC medications: benadryl  cream,  zyrtec, prednisone  old Rx.  Symptoms are: gradually worsening.  Triage Disposition: See Physician Within 24 Hours  Patient/caregiver understands and will follow disposition?: Yes             Copied from CRM #[id number]. Topic: Clinical - Red Word Triage >> Jul 20, 2024  2:18 PM Deleta RAMAN wrote: Red Word that prompted transfer to Nurse Triage: patient is calling due to really bad hives and allergic reaction starting Saturday night Reason for Disposition  [1] MODERATE-SEVERE hives (e.g.,hives interfere with normal activities or work) AND [2] not improved after taking antihistamine (e.g., cetirizine, fexofenadine, or loratadine ) > 24 hours  Answer Assessment - Initial Assessment Questions Patient requesting to be seen today . No available appt . Patient reports she will go to UC somewhere. Please advise if PCP has any openings today .     1. APPEARANCE: What does the rash look like?      Hives, feels inflammed raised red blotches different sizes, shapes  2. LOCATION: Where is the rash located?      Arms, legs torso  3. NUMBER: How many hives are there?      Multiple  4. SIZE: How big are the hives? (e.g., inches, cm, compare to coins) Do they all look the same or do they vary in shape and size?      Multiple shapes and sizes 5. ONSET: When did the hives begin? (e.g., hours or days ago)      Saturday , bendaryl cream helps  6. ITCHING: Does it itch? If Yes, ask: How bad is the itch?  (e.g., none, mild, moderate, severe)     Yes  7. RECURRENT PROBLEM: Have you had hives before? If Yes, ask: When was the last time? and What happened that time?      Yes does not  remember  8. TRIGGERS: Were you exposed to any new food, plant, cosmetic product or animal just before the hives began?     Not sure  9. OTHER SYMPTOMS: Do you have any other symptoms? (e.g., fever, tongue swelling, difficulty breathing, abdomen pain)     Hives, face arms legs torso. Not sleeping taking zyrtec prednisone  using benadryl  cream for itching  10. PREGNANCY: Is there any chance you are pregnant? When was your last menstrual period?       na  Protocols used: Hives-A-AH

## 2024-07-20 NOTE — Telephone Encounter (Signed)
 Spoke to patient and she stated that she is going to UC because she hasn't been able to sleep in a couple days and she need some relief. Patient aware that pcp is out of the office this week and there weren't any availability for today, offered tomorrow for another provider, patient declined.

## 2024-09-08 NOTE — Progress Notes (Deleted)
 Christina Mora [facility] [address] [place] [address] Phone: [phone] Subjective:    I'm seeing this patient by the request  of:  Christina Lavern CROME, MD  CC: low back   YEP:Dlagzrupcz  Christina Mora is a 53 y.o. female coming in with complaint of lumbar spine pain. Has seen ortho for pain. Pain begins in lumbar spine and radiates into T-spine and groin while lying on R side. Has tried flexeril . Patient states      Past Medical History:  Diagnosis Date   Adjustment reaction with anxiety 01/22/2021   2017-2018; situational   Allergy    Loratadine  PRN   Elevated WBC count 2017   Fibroids    Nephrolithiasis 01/22/2021   Pre-diabetes    SVD (spontaneous vaginal delivery)    x 1   Past Surgical History:  Procedure Laterality Date   ABDOMINAL HYSTERECTOMY  09/24/2016   DILATION AND CURETTAGE OF UTERUS     x 4, 3 for heavy bleeding, 1 for MAB   EYE SURGERY Bilateral    as a child at age 57 or 4   FRACTURE SURGERY     R femur fracture age 89.   HYSTERECTOMY ABDOMINAL WITH SALPINGECTOMY Bilateral 09/24/2016   Procedure: HYSTERECTOMY ABDOMINAL WITH Bilateral SALPINGECTOMY with Lysis of Adhesions;  Surgeon: Bobie FORBES Cathlyn JAYSON Nikki, MD;  Location: [facility];  Service: Gynecology;  Laterality: Bilateral;  3 hours   Social History   Socioeconomic History   Marital status: Married    Spouse name: significant other, Lonell   Number of children: 5   Years of education: Not on file   Highest education level: Not on file  Occupational History   Occupation: Psychologist, sport and exercise    Comment: daycare centers x 2    Employer: [employer]  Tobacco Use   Smoking status: Never   Smokeless tobacco: Never  Substance and Sexual Activity   Alcohol use: Yes    Alcohol/week: 0.0 standard drinks of alcohol    Comment: occasional wine    Drug use: No   Sexual activity: Yes    Partners: Female    Birth control/protection: None    Comment: female partner  Other  Topics Concern   Not on file  Social History Narrative   Marital status:separated, now with new partner, female.      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         Social Drivers of Corporate investment banker Strain: Not on file  Food Insecurity: Not on file  Transportation Needs: Not on file  Physical Activity: Not on file  Stress: Not on file  Social Connections: Not on file   Allergies  Allergen Reactions   Robaxin [Methocarbamol] Nausea And Vomiting   Family History  Problem Relation Age of Onset   Stroke Mother    Heart disease Mother        AMI x few with defibrillator/CHF   COPD Mother    Epilepsy Mother    Obesity Mother    Heart attack Mother    Multiple sclerosis Maternal Grandmother    Cancer Paternal Grandmother    Breast cancer Paternal Aunt    Breast cancer Paternal Aunt    Breast cancer Paternal Aunt        Current Outpatient Medications (Analgesics):    HYDROcodone -acetaminophen  (NORCO/VICODIN) 5-325 MG  tablet, Take 1 tablet by mouth 2 (two) times daily as needed for severe pain (pain score 7-10).   Current Outpatient Medications (Other):    cyclobenzaprine  (FLEXERIL ) 10 MG tablet, TAKE 1 TABLET BY MOUTH AT BEDTIME AS NEEDED FOR MUSCLE SPASMS   MELATONIN PO, Take by mouth.   tamsulosin  (FLOMAX ) 0.4 MG CAPS capsule, Take 1 capsule (0.4 mg total) by mouth daily.   Reviewed prior external information including notes and imaging from  primary care provider As well as notes that were available from care everywhere and other healthcare systems.  Past medical history, social, surgical and family history all reviewed in electronic medical record.  No pertanent information unless stated regarding to the chief complaint.   Review of Systems:  No headache, visual changes, nausea, vomiting, diarrhea, constipation, dizziness, abdominal pain, skin  rash, fevers, chills, night sweats, weight loss, swollen lymph nodes, body aches, joint swelling, chest pain, shortness of breath, mood changes. POSITIVE muscle aches  Objective  Last menstrual period 08/23/2016.   General: No apparent distress alert and oriented x3 mood and affect normal, dressed appropriately.  HEENT: Pupils equal, extraocular movements intact  Respiratory: Patient's speak in full sentences and does not appear short of breath  Cardiovascular: No lower extremity edema, non tender, no erythema  Low back exam shows      Impression and Recommendations:    The above documentation has been reviewed and is accurate and complete Christina Mora M Christina Abts, DO

## 2024-09-09 ENCOUNTER — Ambulatory Visit: Admitting: Family Medicine

## 2024-09-20 ENCOUNTER — Encounter: Payer: Self-pay | Admitting: Family Medicine

## 2024-11-01 ENCOUNTER — Encounter: Payer: Self-pay | Admitting: Radiology

## 2024-11-15 ENCOUNTER — Ambulatory Visit: Payer: Self-pay

## 2024-11-15 NOTE — Telephone Encounter (Signed)
 FYI Only or Action Required?: FYI only for provider: appointment scheduled on 11/16/24.  Patient was last seen in primary care on 03/05/2024 by Jodie Lavern CROME, MD.  Called Nurse Triage reporting Flank Pain.  Symptoms began several days ago.  Interventions attempted: Other: increased fluid.  Symptoms are: gradually worsening.  Triage Disposition: See Physician Within 24 Hours  Patient/caregiver understands and will follow disposition?: Yes          Copied from CRM #[id number]. Topic: Clinical - Red Word Triage >> Nov 15, 2024  3:50 PM Mesmerise C wrote: [facility] that prompted transfer to Nurse Triage: Patient believes she has a kidney stone, has pain on right side under rib been ongoing since saturday pain been getting worse each day Reason for Disposition  MODERATE pain (e.g., interferes with normal activities or awakens from sleep)  Answer Assessment - Initial Assessment Questions 1. LOCATION: Where does it hurt? (e.g., left, right)     R side 2. ONSET: When did the pain start?     Saturday 3. SEVERITY: How bad is the pain? (e.g., Scale 1-10; mild, moderate, or severe)     6/10 4. PATTERN: Does the pain come and go, or is it constant?      Constant but changes in intensity 5. CAUSE: What do you think is causing the pain?     Feels like kidney stone 6. OTHER SYMPTOMS:  Do you have any other symptoms? (e.g., fever, abdomen pain, vomiting, leg weakness, burning with urination, blood in urine)     denies 7. PREGNANCY:  Is there any chance you are pregnant? When was your last menstrual period?     N/a  Protocols used: Flank Pain-A-AH

## 2024-11-15 NOTE — Telephone Encounter (Signed)
 Noted

## 2024-11-16 ENCOUNTER — Encounter: Payer: Self-pay | Admitting: Family Medicine

## 2024-11-16 ENCOUNTER — Ambulatory Visit: Admitting: Family Medicine

## 2024-11-16 ENCOUNTER — Other Ambulatory Visit

## 2024-11-16 VITALS — BP 134/86 | HR 94 | Temp 97.0°F | Ht 67.0 in | Wt 230.8 lb

## 2024-11-16 DIAGNOSIS — M545 Low back pain, unspecified: Secondary | ICD-10-CM | POA: Diagnosis not present

## 2024-11-16 DIAGNOSIS — R631 Polydipsia: Secondary | ICD-10-CM

## 2024-11-16 DIAGNOSIS — R10A1 Flank pain, right side: Secondary | ICD-10-CM | POA: Diagnosis not present

## 2024-11-16 DIAGNOSIS — Z87442 Personal history of urinary calculi: Secondary | ICD-10-CM | POA: Diagnosis not present

## 2024-11-16 DIAGNOSIS — Z8669 Personal history of other diseases of the nervous system and sense organs: Secondary | ICD-10-CM | POA: Insufficient documentation

## 2024-11-16 DIAGNOSIS — R7301 Impaired fasting glucose: Secondary | ICD-10-CM

## 2024-11-16 LAB — POCT URINALYSIS DIPSTICK
Bilirubin, UA: NEGATIVE
Blood, UA: POSITIVE — AB
Glucose, UA: NEGATIVE
Ketones, UA: NEGATIVE
Leukocytes, UA: NEGATIVE
Nitrite, UA: NEGATIVE
Protein, UA: NEGATIVE
Spec Grav, UA: 1.02 (ref 1.010–1.025)
Urobilinogen, UA: 0.2 U/dL
pH, UA: 6 (ref 5.0–8.0)

## 2024-11-16 LAB — POCT GLYCOSYLATED HEMOGLOBIN (HGB A1C): Hemoglobin A1C: 5.9 % — AB (ref 4.0–5.6)

## 2024-11-16 MED ORDER — HYDROCODONE-ACETAMINOPHEN 5-325 MG PO TABS: 1.0000 | ORAL_TABLET | Freq: Two times a day (BID) | ORAL | 0 refills | Status: AC | PRN

## 2024-11-16 MED ORDER — CYCLOBENZAPRINE HCL 10 MG PO TABS: 10.0000 mg | ORAL_TABLET | Freq: Every evening | ORAL | 0 refills | Status: DC | PRN

## 2024-11-16 NOTE — Patient Instructions (Signed)
 Please follow up as scheduled for your next visit with me: 02/24/2025   If you have any questions or concerns, please don't hesitate to send me a message via MyChart or call the office at [phone]. Thank you for visiting with us  today! It's our pleasure caring for you.    VISIT SUMMARY: During your visit, we discussed your acute right flank pain, which has been progressively worsening over the past few days. We also reviewed your history of kidney stones, back pain, and peripheral neuropathy symptoms. Additionally, we addressed your increased urinary frequency and sleep disturbances due to pain.  YOUR PLAN: -LOW BACK PAIN WITH POSSIBLE NERVE INVOLVEMENT: Your chronic low back pain has recently worsened, presenting as sharp, aching pain in your right flank. This pain may be related to nerve involvement or musculoskeletal issues. We have referred you to physical therapy for dry needling, prescribed hydrocodone  for nighttime pain management, and cyclobenzaprine  for muscle relaxation and sleep. Please avoid using tramadol  with hydrocodone ; use either one or the other. If the pain persists, we may refer you to a sports medicine or orthopedic specialist. We will also obtain MRI results for further evaluation.  -PERIPHERAL NEUROPATHY SYMPTOMS: You have new numbness in your right pinky toe, which may be related to nerve involvement from your low back pain. We checked your A1c to assess for diabetes and advised you to monitor your symptoms and report any changes.  INSTRUCTIONS: Please follow up with physical therapy for dry needling as recommended. Use hydrocodone  or tramadol  for pain management, but not both together. Take cyclobenzaprine  as prescribed for muscle relaxation and to help with sleep. Monitor your peripheral neuropathy symptoms and report any changes. We will review your MRI results once available. If your pain persists, we may refer you to a sports medicine or orthopedic  specialist.                      Contains text generated by Abridge.                                 Contains text generated by Abridge.

## 2024-11-16 NOTE — Progress Notes (Signed)
 Subjective  CC:  Chief Complaint  Patient presents with   Flank Pain    Pt c/o right sided flank pain, Present for 3-4 days and has been worsening. Has tried aleve  and tramadol , which does help slightly. Hx of kidney stones.     HPI: Christina Mora is a 53 y.o. female who presents to the office today to address the problems listed above in the chief complaint. Discussed the use of AI scribe software for clinical note transcription with the patient, who gave verbal consent to proceed.  History of Present Illness Christina Mora is a 53 year old female with a history of back pain and kidney stones who presents with acute right flank pain. Please see records from March 2025. I reviewed records and referrals and imaging studies.   Right flank pain - Acute onset of right flank pain for the past 3-4 days - Pain initially mild, progressively worsening over the last 48 hours - Current pain intensity rated 8-9 out of 10 - Pain described as sharp and aching, similar to previous kidney stone episodes but in recollecting history: this is how her low back pain presented earlier this year (had f/u with urology, neg Renal CT, and pain resolved with dry needling and PT; had ortho eval as well and possibly an lumbar MRI but need to get results) - Pain localized to a specific spot on her back - Presence of a small, cystic lump at the site of pain - No urinary symptoms, hematuria, or fever  Pain management and sleep disturbance - Current pain management includes Aleve  and tramadol  taken concurrently during the day - Despite medication use, difficulty sleeping due to pain - Cyclobenzaprine  previously used, which improves sleep but does not alleviate muscle pain  History of kidney stones and back pain - History of kidney stones diagnosed twice, with other imaging showing no stones - Multiple prior MRIs and CT scans with mixed findings regarding kidney stones - History of back pain previously treated with  dry needling, which was highly effective  Urinary frequency - Increased frequency of urination over the past few months  Peripheral neuropathy - Numbness in right pinky toe for several months - History of arm numbness and weakness, previously treated successfully at a neuropathy clinic about 10 years ago  Cerebral palsy - 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 Acute right-sided low back pain without sciatica   2. Right flank pain   3. History of cerebral palsy   4. History of nephrolithiasis   5. Polydipsia   6. IFG (impaired fasting glucose)      Plan  Assessment and Plan Assessment & Plan Low back pain with possible nerve involvement/ h/o cp as child: ? Some mm tightness Chronic low back pain with recent exacerbation, presenting as acute aching pain in the right flank, worsening over the last 48 hours. Pain is sharp, rated 8-9/10, and not relieved by massage. No urinary symptoms or fever. Previous imaging showed no kidney stones. Differential includes musculoskeletal pain, nerve involvement, or herniated disc. Previous relief with dry needling suggests musculoskeletal origin. Current pain may be related to nerve involvement given peripheral neuropathy symptoms. - Referred to physical therapy for dry needling. - Prescribed hydrocodone  for nighttime pain management. - Prescribed cyclobenzaprine  for muscle relaxation and sleep. - Advised against using tramadol  with hydrocodone ; use either one or the other. - Will  consider referral to sports medicine or orthopedic specialist if pain persists. - Will obtain MRI results for further evaluation. - Neuro exam reassuring today - doesn't sound like acute nephrolithiasis today. Push fluids.   Peripheral neuropathy symptoms New onset of numbness in the right pinky toe, with waves of numbness and occasional dead feeling. Symptoms suggestive of  peripheral neuropathy, possibly related to low back nerve involvement. History of neuropathy and mild cerebral palsy may contribute to symptoms. - need to get lumbar MRI results - Advised to monitor symptoms and report any changes.  Polydipsia and IFG with A1c 5.9: diet changes recommended.  Lab Results  Component Value Date   HGBA1C 5.9 (A) 11/16/2024   HGBA1C 5.8 07/24/2021   HGBA1C 5.3 01/22/2021       Follow up: prn Orders Placed This Encounter  Procedures   POCT Urinalysis Dipstick   POCT glycosylated hemoglobin (Hb A1C)   Meds ordered this encounter  Medications   HYDROcodone -acetaminophen  (NORCO/VICODIN) 5-325 MG tablet    Sig: Take 1 tablet by mouth 2 (two) times daily as needed for severe pain (pain score 7-10).    Dispense:  20 tablet    Refill:  0   cyclobenzaprine  (FLEXERIL ) 10 MG tablet    Sig: Take 1 tablet (10 mg total) by mouth at bedtime as needed for muscle spasms.    Dispense:  30 tablet    Refill:  0     I reviewed the patients updated PMH, FH, and SocHx.  Patient Active Problem List   Diagnosis Date Noted   Family history of breast cancer 01/22/2021    Priority: High   Leukocytosis 01/22/2021    Priority: Medium    Nephrolithiasis 01/22/2021    Priority: Medium    Obesity (BMI 30-39.9) 01/22/2021    Priority: Medium    History of cerebral palsy 11/16/2024    Priority: Low   Chronic allergic rhinitis 05/17/2014    Priority: Low   Thrombocytosis 03/05/2024   Current Meds  Medication Sig   cyclobenzaprine  (FLEXERIL ) 10 MG tablet Take 1 tablet (10 mg total) by mouth at bedtime as needed for muscle spasms.   MELATONIN PO Take by mouth.   Misc Natural Products (TURMERIC, CURCUMIN, PO) Take by mouth.   traMADol  (ULTRAM ) 50 MG tablet Take by mouth every 6 (six) hours as needed for severe pain (pain score 7-10).   Allergies: Patient is allergic to robaxin [methocarbamol]. Family History: Patient family history includes Breast cancer in her  paternal aunt, paternal aunt, and paternal aunt; COPD in her mother; Cancer in her paternal grandmother; Epilepsy in her mother; Heart attack in her mother; Heart disease in her mother; Multiple sclerosis in her maternal grandmother; Obesity in her mother; Stroke in her mother. Social History:  Patient  reports that she has never smoked. She has never used smokeless tobacco. She reports current alcohol use. She reports that she does not use drugs.  Review of Systems: Constitutional: Negative for fever malaise or anorexia Cardiovascular: negative for chest pain Respiratory: negative for SOB or persistent cough Gastrointestinal: negative for abdominal pain  Objective  Vitals: BP 134/86 (BP Location: Left Arm, Patient Position: Sitting, Cuff Size: Normal)   Pulse 94   Temp (!) 97 F (36.1 C) (Temporal)   Ht 5' 7 (1.702 m)   Wt 230 lb 12.8 oz (104.7 kg)   LMP 08/23/2016   SpO2 97%   BMI 36.15 kg/m  General: appears mildly uncomfortable , A&Ox3 HEENT: PEERL, conjunctiva normal, neck is  supple Cardiovascular:  RRR without murmur or gallop.  Respiratory:  Good breath sounds bilaterally, CTAB with normal respiratory effort Back: no cvat, right paraveterbral mm ttp, neg SLR B. Nl strength bilaterally, nl gai.  Abd: benign abdomen. No suprapubic ttp Commons side effects, risks, benefits, and alternatives for medications and treatment plan prescribed today were discussed, and the patient expressed understanding of the given instructions. Patient is instructed to call or message via MyChart if he/she has any questions or concerns regarding our treatment plan. No barriers to understanding were identified. We discussed Red Flag symptoms and signs in detail. Patient expressed understanding regarding what to do in case of urgent or emergency type symptoms.  Medication list was reconciled, printed and provided to the patient in AVS. Patient instructions and summary information was reviewed with the  patient as documented in the AVS. This note was prepared with assistance of Dragon voice recognition software. Occasional wrong-word or sound-a-like substitutions may have occurred due to the inherent limitations of voice recognition software

## 2024-12-13 ENCOUNTER — Other Ambulatory Visit: Payer: Self-pay | Admitting: Family Medicine

## 2024-12-13 NOTE — Telephone Encounter (Signed)
 11/16/2024 LOV  11/16/2024 fill date  30/0 refills

## 2025-01-26 ENCOUNTER — Telehealth: Payer: Self-pay | Admitting: Family Medicine

## 2025-01-26 NOTE — Telephone Encounter (Signed)
 Call pt to reschedule 02/24/25 appt. Pt will call back to r/s.

## 2025-02-24 ENCOUNTER — Encounter: Admitting: Family Medicine
